# Patient Record
Sex: Female | Born: 1935
Health system: Southern US, Community
[De-identification: ages and names within clinical notes are randomized; demographics above are authoritative.]

## PROBLEM LIST (undated history)

## (undated) DIAGNOSIS — S62102A Fracture of unspecified carpal bone, left wrist, initial encounter for closed fracture: Secondary | ICD-10-CM

## (undated) DIAGNOSIS — H409 Unspecified glaucoma: Secondary | ICD-10-CM

## (undated) DIAGNOSIS — R7303 Prediabetes: Secondary | ICD-10-CM

## (undated) DIAGNOSIS — I4891 Unspecified atrial fibrillation: Secondary | ICD-10-CM

## (undated) DIAGNOSIS — I10 Essential (primary) hypertension: Secondary | ICD-10-CM

## (undated) DIAGNOSIS — E785 Hyperlipidemia, unspecified: Secondary | ICD-10-CM

## (undated) HISTORY — DX: Hyperlipidemia, unspecified: E78.5

## (undated) HISTORY — PX: CATARACT EXTRACTION: SUR2

## (undated) HISTORY — DX: Prediabetes: R73.03

## (undated) HISTORY — PX: OTHER SURGICAL HISTORY: SHX169

## (undated) HISTORY — DX: Unspecified glaucoma: H40.9

---
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
## 1999-07-15 ENCOUNTER — Other Ambulatory Visit: Admission: RE | Admit: 1999-07-15 | Discharge: 1999-07-15 | Payer: Self-pay | Admitting: Family Medicine

## 2000-12-16 ENCOUNTER — Other Ambulatory Visit: Admission: RE | Admit: 2000-12-16 | Discharge: 2000-12-16 | Payer: Self-pay | Admitting: Family Medicine

## 2003-03-06 ENCOUNTER — Other Ambulatory Visit: Admission: RE | Admit: 2003-03-06 | Discharge: 2003-03-06 | Payer: Self-pay | Admitting: Family Medicine

## 2014-11-30 DIAGNOSIS — H02839 Dermatochalasis of unspecified eye, unspecified eyelid: Secondary | ICD-10-CM | POA: Diagnosis not present

## 2014-11-30 DIAGNOSIS — H18411 Arcus senilis, right eye: Secondary | ICD-10-CM | POA: Diagnosis not present

## 2014-11-30 DIAGNOSIS — H2511 Age-related nuclear cataract, right eye: Secondary | ICD-10-CM | POA: Diagnosis not present

## 2014-11-30 DIAGNOSIS — H4010X1 Unspecified open-angle glaucoma, mild stage: Secondary | ICD-10-CM | POA: Diagnosis not present

## 2015-01-07 DIAGNOSIS — H2512 Age-related nuclear cataract, left eye: Secondary | ICD-10-CM | POA: Diagnosis not present

## 2015-01-07 DIAGNOSIS — H25812 Combined forms of age-related cataract, left eye: Secondary | ICD-10-CM | POA: Diagnosis not present

## 2015-01-08 DIAGNOSIS — H2511 Age-related nuclear cataract, right eye: Secondary | ICD-10-CM | POA: Diagnosis not present

## 2015-01-28 DIAGNOSIS — H25811 Combined forms of age-related cataract, right eye: Secondary | ICD-10-CM | POA: Diagnosis not present

## 2015-01-28 DIAGNOSIS — H2511 Age-related nuclear cataract, right eye: Secondary | ICD-10-CM | POA: Diagnosis not present

## 2015-02-04 DIAGNOSIS — H2512 Age-related nuclear cataract, left eye: Secondary | ICD-10-CM | POA: Diagnosis not present

## 2015-02-20 DIAGNOSIS — D229 Melanocytic nevi, unspecified: Secondary | ICD-10-CM | POA: Diagnosis not present

## 2015-02-20 DIAGNOSIS — F419 Anxiety disorder, unspecified: Secondary | ICD-10-CM | POA: Diagnosis not present

## 2015-02-20 DIAGNOSIS — Z139 Encounter for screening, unspecified: Secondary | ICD-10-CM | POA: Diagnosis not present

## 2015-02-20 DIAGNOSIS — I10 Essential (primary) hypertension: Secondary | ICD-10-CM | POA: Diagnosis not present

## 2015-02-20 DIAGNOSIS — D499 Neoplasm of unspecified behavior of unspecified site: Secondary | ICD-10-CM | POA: Diagnosis not present

## 2015-02-20 DIAGNOSIS — L570 Actinic keratosis: Secondary | ICD-10-CM | POA: Diagnosis not present

## 2015-02-26 DIAGNOSIS — H2511 Age-related nuclear cataract, right eye: Secondary | ICD-10-CM | POA: Diagnosis not present

## 2015-02-28 DIAGNOSIS — Z961 Presence of intraocular lens: Secondary | ICD-10-CM | POA: Diagnosis not present

## 2015-04-06 ENCOUNTER — Inpatient Hospital Stay: Payer: Medicare Other

## 2015-04-06 ENCOUNTER — Inpatient Hospital Stay: Payer: Medicare Other | Admitting: Anesthesiology

## 2015-04-06 ENCOUNTER — Inpatient Hospital Stay: Admit: 2015-04-06 | Payer: Self-pay | Admitting: Orthopedic Surgery

## 2015-04-06 ENCOUNTER — Emergency Department: Payer: Medicare Other

## 2015-04-06 ENCOUNTER — Encounter
Admission: EM | Disposition: A | Discharge status: Home / Self Care | Payer: Self-pay | Source: Home / Self Care | Attending: Orthopedic Surgery

## 2015-04-06 ENCOUNTER — Other Ambulatory Visit: Payer: Self-pay

## 2015-04-06 ENCOUNTER — Inpatient Hospital Stay
Admission: EM | Admit: 2015-04-06 | Discharge: 2015-04-08 | DRG: 511 | Disposition: A | Discharge status: Home / Self Care | Payer: Medicare Other | Attending: Orthopedic Surgery | Admitting: Orthopedic Surgery

## 2015-04-06 DIAGNOSIS — W010XXA Fall on same level from slipping, tripping and stumbling without subsequent striking against object, initial encounter: Secondary | ICD-10-CM | POA: Diagnosis present

## 2015-04-06 DIAGNOSIS — S52691B Other fracture of lower end of right ulna, initial encounter for open fracture type I or II: Secondary | ICD-10-CM | POA: Diagnosis not present

## 2015-04-06 DIAGNOSIS — Y92019 Unspecified place in single-family (private) house as the place of occurrence of the external cause: Secondary | ICD-10-CM

## 2015-04-06 DIAGNOSIS — Z9889 Other specified postprocedural states: Secondary | ICD-10-CM

## 2015-04-06 DIAGNOSIS — S6992XA Unspecified injury of left wrist, hand and finger(s), initial encounter: Secondary | ICD-10-CM | POA: Diagnosis not present

## 2015-04-06 DIAGNOSIS — W19XXXA Unspecified fall, initial encounter: Secondary | ICD-10-CM | POA: Diagnosis not present

## 2015-04-06 DIAGNOSIS — Z8781 Personal history of (healed) traumatic fracture: Secondary | ICD-10-CM

## 2015-04-06 DIAGNOSIS — I10 Essential (primary) hypertension: Secondary | ICD-10-CM

## 2015-04-06 DIAGNOSIS — S52502B Unspecified fracture of the lower end of left radius, initial encounter for open fracture type I or II: Secondary | ICD-10-CM | POA: Diagnosis not present

## 2015-04-06 DIAGNOSIS — Z471 Aftercare following joint replacement surgery: Secondary | ICD-10-CM | POA: Diagnosis not present

## 2015-04-06 DIAGNOSIS — Z9689 Presence of other specified functional implants: Secondary | ICD-10-CM | POA: Diagnosis not present

## 2015-04-06 DIAGNOSIS — S52602B Unspecified fracture of lower end of left ulna, initial encounter for open fracture type I or II: Secondary | ICD-10-CM | POA: Diagnosis not present

## 2015-04-06 DIAGNOSIS — M79602 Pain in left arm: Secondary | ICD-10-CM | POA: Diagnosis not present

## 2015-04-06 DIAGNOSIS — N390 Urinary tract infection, site not specified: Secondary | ICD-10-CM | POA: Diagnosis not present

## 2015-04-06 DIAGNOSIS — S299XXA Unspecified injury of thorax, initial encounter: Secondary | ICD-10-CM | POA: Diagnosis not present

## 2015-04-06 DIAGNOSIS — Z01818 Encounter for other preprocedural examination: Secondary | ICD-10-CM | POA: Diagnosis not present

## 2015-04-06 DIAGNOSIS — S5290XB Unspecified fracture of unspecified forearm, initial encounter for open fracture type I or II: Secondary | ICD-10-CM | POA: Diagnosis present

## 2015-04-06 DIAGNOSIS — S52332A Displaced oblique fracture of shaft of left radius, initial encounter for closed fracture: Secondary | ICD-10-CM | POA: Diagnosis not present

## 2015-04-06 DIAGNOSIS — S5292XB Unspecified fracture of left forearm, initial encounter for open fracture type I or II: Secondary | ICD-10-CM | POA: Diagnosis not present

## 2015-04-06 DIAGNOSIS — S52591B Other fractures of lower end of right radius, initial encounter for open fracture type I or II: Secondary | ICD-10-CM | POA: Diagnosis not present

## 2015-04-06 DIAGNOSIS — S52202A Unspecified fracture of shaft of left ulna, initial encounter for closed fracture: Secondary | ICD-10-CM | POA: Diagnosis not present

## 2015-04-06 DIAGNOSIS — S52232A Displaced oblique fracture of shaft of left ulna, initial encounter for closed fracture: Secondary | ICD-10-CM | POA: Diagnosis not present

## 2015-04-06 DIAGNOSIS — S52302A Unspecified fracture of shaft of left radius, initial encounter for closed fracture: Secondary | ICD-10-CM | POA: Diagnosis not present

## 2015-04-06 HISTORY — PX: I&D EXTREMITY: SHX5045

## 2015-04-06 HISTORY — DX: Essential (primary) hypertension: I10

## 2015-04-06 HISTORY — DX: Fracture of unspecified carpal bone, left wrist, initial encounter for closed fracture: S62.102A

## 2015-04-06 HISTORY — PX: ORIF RADIAL FRACTURE: SHX5113

## 2015-04-06 LAB — COMPREHENSIVE METABOLIC PANEL
ALBUMIN: 3.7 g/dL (ref 3.5–5.0)
ALT: 17 U/L (ref 14–54)
ANION GAP: 7 (ref 5–15)
AST: 26 U/L (ref 15–41)
Alkaline Phosphatase: 44 U/L (ref 38–126)
BUN: 26 mg/dL — ABNORMAL HIGH (ref 6–20)
CHLORIDE: 101 mmol/L (ref 101–111)
CO2: 27 mmol/L (ref 22–32)
Calcium: 8.9 mg/dL (ref 8.9–10.3)
Creatinine, Ser: 1.08 mg/dL — ABNORMAL HIGH (ref 0.44–1.00)
GFR calc Af Amer: 53 mL/min — ABNORMAL LOW (ref >60)
GFR, EST NON AFRICAN AMERICAN: 46 mL/min — AB (ref >60)
GLUCOSE: 97 mg/dL (ref 65–99)
Potassium: 3.6 mmol/L (ref 3.5–5.1)
Sodium: 135 mmol/L (ref 135–145)
Total Bilirubin: 0.7 mg/dL (ref 0.3–1.2)
Total Protein: 7 g/dL (ref 6.5–8.1)

## 2015-04-06 LAB — URINALYSIS COMPLETE WITH MICROSCOPIC (ARMC ONLY)
Bilirubin Urine: NEGATIVE
Glucose, UA: NEGATIVE mg/dL
HGB URINE DIPSTICK: NEGATIVE
Ketones, ur: NEGATIVE mg/dL
Nitrite: POSITIVE — AB
PROTEIN: NEGATIVE mg/dL
Specific Gravity, Urine: 1.012 (ref 1.005–1.030)
pH: 7 (ref 5.0–8.0)

## 2015-04-06 LAB — CBC
HEMATOCRIT: 39.7 % (ref 35.0–47.0)
Hemoglobin: 13 g/dL (ref 12.0–16.0)
MCH: 30.4 pg (ref 26.0–34.0)
MCHC: 32.8 g/dL (ref 32.0–36.0)
MCV: 92.7 fL (ref 80.0–100.0)
Platelets: 169 10*3/uL (ref 150–440)
RBC: 4.28 MIL/uL (ref 3.80–5.20)
RDW: 13.7 % (ref 11.5–14.5)
WBC: 8.6 10*3/uL (ref 3.6–11.0)

## 2015-04-06 SURGERY — OPEN REDUCTION INTERNAL FIXATION (ORIF) RADIAL FRACTURE
Anesthesia: General | Site: Arm Lower | Laterality: Left

## 2015-04-06 MED ORDER — MAGNESIUM HYDROXIDE 400 MG/5ML PO SUSP: 30.0000 mL | Freq: Every day | ORAL | Status: DC | PRN

## 2015-04-06 MED ORDER — SODIUM CHLORIDE 0.9 % IV SOLN: INTRAVENOUS | Status: DC

## 2015-04-06 MED ORDER — MIDAZOLAM HCL 2 MG/2ML IJ SOLN
INTRAMUSCULAR | Status: DC | PRN
  Administered 2015-04-06: 2 mg via INTRAVENOUS

## 2015-04-06 MED ORDER — LATANOPROST 0.005 % OP SOLN
1.0000 [drp] | Freq: Every day | OPHTHALMIC | Status: DC
  Administered 2015-04-06 – 2015-04-07 (×2): 1 [drp] via OPHTHALMIC
  Filled 2015-04-06: qty 2.5

## 2015-04-06 MED ORDER — KETOROLAC TROMETHAMINE 30 MG/ML IJ SOLN
INTRAMUSCULAR | Status: DC | PRN
  Administered 2015-04-06: 30 mg via INTRAVENOUS

## 2015-04-06 MED ORDER — CEFAZOLIN SODIUM 1-5 GM-% IV SOLN
1.0000 g | Freq: Three times a day (TID) | INTRAVENOUS | Status: DC
  Administered 2015-04-07 – 2015-04-08 (×5): 1 g via INTRAVENOUS
  Filled 2015-04-06 (×7): qty 50

## 2015-04-06 MED ORDER — METOCLOPRAMIDE HCL 5 MG/ML IJ SOLN
10.0000 mg | Freq: Once | INTRAMUSCULAR | Status: AC
  Administered 2015-04-06: 10 mg via INTRAVENOUS

## 2015-04-06 MED ORDER — FENTANYL CITRATE (PF) 100 MCG/2ML IJ SOLN: 25.0000 ug | INTRAMUSCULAR | Status: DC | PRN

## 2015-04-06 MED ORDER — ALPRAZOLAM 0.5 MG PO TABS
0.5000 mg | ORAL_TABLET | Freq: Two times a day (BID) | ORAL | Status: DC
  Administered 2015-04-06 – 2015-04-08 (×4): 0.5 mg via ORAL
  Filled 2015-04-06 (×4): qty 1

## 2015-04-06 MED ORDER — HYDROCODONE-ACETAMINOPHEN 5-325 MG PO TABS
1.0000 | ORAL_TABLET | ORAL | Status: DC | PRN
  Administered 2015-04-06: 1 via ORAL
  Filled 2015-04-06: qty 1

## 2015-04-06 MED ORDER — NEOMYCIN-POLYMYXIN B GU 40-200000 IR SOLN
Status: AC
  Filled 2015-04-06: qty 20

## 2015-04-06 MED ORDER — PROPOFOL 10 MG/ML IV BOLUS
INTRAVENOUS | Status: DC | PRN
  Administered 2015-04-06: 150 mg via INTRAVENOUS

## 2015-04-06 MED ORDER — FENTANYL CITRATE (PF) 100 MCG/2ML IJ SOLN
INTRAMUSCULAR | Status: DC | PRN
  Administered 2015-04-06: 50 ug via INTRAVENOUS
  Administered 2015-04-06 (×2): 25 ug via INTRAVENOUS
  Administered 2015-04-06: 100 ug via INTRAVENOUS

## 2015-04-06 MED ORDER — CEFAZOLIN SODIUM 1-5 GM-% IV SOLN
INTRAVENOUS | Status: AC
  Administered 2015-04-06: 1 g via INTRAVENOUS
  Filled 2015-04-06: qty 50

## 2015-04-06 MED ORDER — BISACODYL 10 MG RE SUPP: 10.0000 mg | Freq: Every day | RECTAL | Status: DC | PRN

## 2015-04-06 MED ORDER — DEXAMETHASONE SODIUM PHOSPHATE 4 MG/ML IJ SOLN
INTRAMUSCULAR | Status: DC | PRN
  Administered 2015-04-06: 4 mg via INTRAVENOUS

## 2015-04-06 MED ORDER — PHENYLEPHRINE HCL 10 MG/ML IJ SOLN
INTRAMUSCULAR | Status: DC | PRN
  Administered 2015-04-06: 100 ug via INTRAVENOUS

## 2015-04-06 MED ORDER — SODIUM CHLORIDE 0.9 % IV BOLUS (SEPSIS)
1000.0000 mL | Freq: Once | INTRAVENOUS | Status: AC
  Administered 2015-04-06: 1000 mL via INTRAVENOUS

## 2015-04-06 MED ORDER — ONDANSETRON HCL 4 MG PO TABS: 4.0000 mg | ORAL_TABLET | Freq: Four times a day (QID) | ORAL | Status: DC | PRN

## 2015-04-06 MED ORDER — LIDOCAINE HCL (CARDIAC) 20 MG/ML IV SOLN
INTRAVENOUS | Status: DC | PRN
  Administered 2015-04-06: 60 mg via INTRAVENOUS

## 2015-04-06 MED ORDER — ONDANSETRON HCL 4 MG/2ML IJ SOLN: 4.0000 mg | Freq: Four times a day (QID) | INTRAMUSCULAR | Status: DC | PRN

## 2015-04-06 MED ORDER — METOCLOPRAMIDE HCL 5 MG/ML IJ SOLN
INTRAMUSCULAR | Status: AC
  Administered 2015-04-06: 10 mg via INTRAVENOUS
  Filled 2015-04-06: qty 2

## 2015-04-06 MED ORDER — TRIAMTERENE-HCTZ 37.5-25 MG PO TABS
1.5000 | ORAL_TABLET | Freq: Every day | ORAL | Status: DC
  Filled 2015-04-06: qty 2
  Filled 2015-04-06: qty 1

## 2015-04-06 MED ORDER — LACTATED RINGERS IV SOLN
INTRAVENOUS | Status: DC | PRN
  Administered 2015-04-06 (×2): via INTRAVENOUS

## 2015-04-06 MED ORDER — ONDANSETRON HCL 4 MG/2ML IJ SOLN: 4.0000 mg | Freq: Once | INTRAMUSCULAR | Status: DC | PRN

## 2015-04-06 MED ORDER — ONDANSETRON HCL 4 MG/2ML IJ SOLN
INTRAMUSCULAR | Status: DC | PRN
  Administered 2015-04-06: 4 mg via INTRAVENOUS

## 2015-04-06 MED ORDER — OXYCODONE HCL 5 MG PO TABS: 5.0000 mg | ORAL_TABLET | ORAL | Status: DC | PRN

## 2015-04-06 MED ORDER — NEOMYCIN-POLYMYXIN B GU 40-200000 IR SOLN
Status: DC | PRN
  Administered 2015-04-06: 4 mL

## 2015-04-06 MED ORDER — CEFAZOLIN SODIUM 1-5 GM-% IV SOLN
1.0000 g | Freq: Once | INTRAVENOUS | Status: AC
  Administered 2015-04-06: 1 g via INTRAVENOUS

## 2015-04-06 MED ORDER — DOCUSATE SODIUM 100 MG PO CAPS
100.0000 mg | ORAL_CAPSULE | Freq: Two times a day (BID) | ORAL | Status: DC
  Administered 2015-04-06 – 2015-04-08 (×4): 100 mg via ORAL
  Filled 2015-04-06 (×4): qty 1

## 2015-04-06 MED ORDER — SODIUM CHLORIDE 0.9 % IV SOLN
INTRAVENOUS | Status: DC
  Administered 2015-04-06: 20:00:00 via INTRAVENOUS

## 2015-04-06 SURGICAL SUPPLY — 53 items
BANDAGE ELASTIC 3 CLIP ST LF (GAUZE/BANDAGES/DRESSINGS) ×4 IMPLANT
BANDAGE ELASTIC 4 CLIP ST LF (GAUZE/BANDAGES/DRESSINGS) ×4 IMPLANT
BIT DRILL 2.5X110 QC LCP DISP (BIT) ×4 IMPLANT
BIT DRILL 2.8 (BIT) ×2
BIT DRILL CANN QC 2.8X165 (BIT) ×2 IMPLANT
BLADE SURG 15 STRL LF DISP TIS (BLADE) ×2 IMPLANT
BLADE SURG 15 STRL SS (BLADE) ×2
CANISTER SUCT 1200ML W/VALVE (MISCELLANEOUS) IMPLANT
CANISTER SUCT 3000ML PPV (MISCELLANEOUS) ×4 IMPLANT
CAST PADDING 3X4FT ST 30246 (SOFTGOODS) ×2
CHLORAPREP W/TINT 26ML (MISCELLANEOUS) IMPLANT
DRAPE FLUOR MINI C-ARM 54X84 (DRAPES) ×4 IMPLANT
DRILL BIT 2.8MM (BIT) ×2
GAUZE PETRO XEROFOAM 1X8 (MISCELLANEOUS) ×8 IMPLANT
GAUZE SPONGE 4X4 12PLY STRL (GAUZE/BANDAGES/DRESSINGS) ×4 IMPLANT
GAUZE XEROFORM 4X4 STRL (GAUZE/BANDAGES/DRESSINGS) ×4 IMPLANT
GLOVE BIOGEL M 6.5 STRL (GLOVE) ×4 IMPLANT
GLOVE BIOGEL PI IND STRL 9 (GLOVE) ×2 IMPLANT
GLOVE BIOGEL PI INDICATOR 9 (GLOVE) ×2
GLOVE INDICATOR 7.0 STRL GRN (GLOVE) ×4 IMPLANT
GLOVE SURG ORTHO 9.0 STRL STRW (GLOVE) ×4 IMPLANT
GOWN SPECIALTY ULTRA XL (MISCELLANEOUS) ×4 IMPLANT
GOWN STRL REUS W/ TWL LRG LVL3 (GOWN DISPOSABLE) ×2 IMPLANT
GOWN STRL REUS W/TWL 2XL LVL3 (GOWN DISPOSABLE) ×4 IMPLANT
GOWN STRL REUS W/TWL LRG LVL3 (GOWN DISPOSABLE) ×2
KIT RM TURNOVER STRD PROC AR (KITS) ×4 IMPLANT
NEEDLE FILTER BLUNT 18X 1/2SAF (NEEDLE) ×2
NEEDLE FILTER BLUNT 18X1 1/2 (NEEDLE) ×2 IMPLANT
NS IRRIG 1000ML POUR BTL (IV SOLUTION) ×4 IMPLANT
NS IRRIG 500ML POUR BTL (IV SOLUTION) IMPLANT
PACK EXTREMITY ARMC (MISCELLANEOUS) ×4 IMPLANT
PAD CAST CTTN 3X4 STRL (SOFTGOODS) ×2 IMPLANT
PAD CAST CTTN 4X4 STRL (SOFTGOODS) ×4 IMPLANT
PAD GROUND ADULT SPLIT (MISCELLANEOUS) ×4 IMPLANT
PADDING CAST COTTON 4X4 STRL (SOFTGOODS) ×4
PROS LCP PLATE 5H 72MM (Plate) ×4 IMPLANT
PROS LCP PLATE 6H 85MM (Plate) ×4 IMPLANT
PROSTHESIS LCP PLATE 5H 72MM (Plate) ×2 IMPLANT
PROSTHESIS LCP PLATE 6H 85MM (Plate) ×2 IMPLANT
SCREW CORTEX 3.5 14MM (Screw) ×10 IMPLANT
SCREW CORTEX 3.5 16MM (Screw) ×8 IMPLANT
SCREW LOCK CORT ST 3.5X14 (Screw) ×10 IMPLANT
SCREW LOCK CORT ST 3.5X16 (Screw) ×8 IMPLANT
SCREW LOCK T15 FT 16X3.5X2.9X (Screw) ×2 IMPLANT
SCREW LOCKING 3.5X16 (Screw) ×2 IMPLANT
SLING ARM S TX990203 (SOFTGOODS) ×4 IMPLANT
SPLINT CAST 1 STEP 3X12 (MISCELLANEOUS) ×4 IMPLANT
STOCKINETTE STRL 4IN 9604848 (GAUZE/BANDAGES/DRESSINGS) IMPLANT
SUT ETHILON 4-0 (SUTURE) ×4
SUT ETHILON 4-0 FS2 18XMFL BLK (SUTURE) ×4
SUT VICRYL 3-0 27IN (SUTURE) ×4 IMPLANT
SUTURE ETHLN 4-0 FS2 18XMF BLK (SUTURE) ×4 IMPLANT
SYR 3ML LL SCALE MARK (SYRINGE) ×4 IMPLANT

## 2015-04-06 NOTE — ED Provider Notes (Signed)
ED ECG REPORT I, Lavonia Drafts, the attending physician, personally viewed and interpreted this ECG.   Date: 04/06/2015  EKG Time: 2:42 PM  Rate: 71  Rhythm: normal sinus rhythm, 1st degree AV block  Axis: Alexis  Intervals:first-degree A-V block   ST&T Change: None   Lavonia Drafts, MD 04/06/15 1457

## 2015-04-06 NOTE — Anesthesia Postprocedure Evaluation (Signed)
  Anesthesia Post-op Note  Patient: Kristen Graham  Procedure(s) Performed: Procedure(s): OPEN REDUCTION INTERNAL FIXATION (ORIF) RADIAL FRACTURE (Left) IRRIGATION AND DEBRIDEMENT left forearm (Left)  Anesthesia type:General  Patient location: PACU  Post pain: Pain level controlled  Post assessment: Post-op Vital signs reviewed, Patient's Cardiovascular Status Stable, Respiratory Function Stable, Patent Airway and No signs of Nausea or vomiting  Post vital signs: Reviewed and stable  Last Vitals:  Filed Vitals:   04/06/15 1530  BP: 156/68  Pulse: 72  Temp:   Resp: 18    Level of consciousness: awake, alert  and patient cooperative  Complications: No apparent anesthesia complications

## 2015-04-06 NOTE — ED Provider Notes (Signed)
[facility] Emergency Department Provider Note  ____________________________________________  Time seen: Approximately 1:52 PM  I have reviewed the triage vital signs and the nursing notes.   HISTORY  Chief Complaint Fall and Arm Injury    HPI Kristen Graham is a 81 y.o. female presents to the emergency department after a mechanical fall in her home just before arrival. She reports that she tripped over a space heater and landed on her outstretched hand. She has an obvious deformity of the left wrist/forearm with laceration to the lateral side. She denies other injury, specifically striking her head or losing consciousness.   Past Medical History  Diagnosis Date  . Hypertension   . Left wrist fracture     Patient Active Problem List   Diagnosis Date Noted  . Open forearm fracture 04/06/2015    Past Surgical History  Procedure Laterality Date  . I&d leg    . Cataract extraction      No current outpatient prescriptions on file.  Allergies Sulfa antibiotics  History reviewed. No pertinent family history.  Social History History  Substance Use Topics  . Smoking status: Never Smoker   . Smokeless tobacco: Not on file  . Alcohol Use: No    Review of Systems Constitutional: No recent illness. Eyes: No visual changes. ENT: No sore throat. Cardiovascular: Denies chest pain or palpitations. Respiratory: Denies shortness of breath. Gastrointestinal: No abdominal pain.  Genitourinary: Negative for dysuria. Musculoskeletal: Pain in left wrist. Skin: Negative for rash. Neurological: Negative for headaches, focal weakness or numbness. 10-point ROS otherwise negative.  ____________________________________________   PHYSICAL EXAM:  VITAL SIGNS: ED Triage Vitals  Enc Vitals Group     BP 04/06/15 1353 144/71 mmHg     Pulse Rate 04/06/15 1353 75     Resp 04/06/15 1353 18     Temp 04/06/15 1353 97.7 F (36.5 C)     Temp Source 04/06/15  1353 Oral     SpO2 04/06/15 1353 99 %     Weight 04/06/15 1353 139 lb (63.05 kg)     Height 04/06/15 1353 5\' 3"  (1.6 m)     Head Cir --      Peak Flow --      Pain Score 04/06/15 1353 3     Pain Loc --      Pain Edu? --      Excl. in [place]? --     Constitutional: Alert and oriented. Well appearing and in no acute distress. Eyes: Conjunctivae are normal. EOMI. Head: Atraumatic. Nose: No congestion/rhinnorhea. Neck: No stridor.  Respiratory: Normal respiratory effort.   Musculoskeletal: Obvious deformity of the left wrist and forearm with an approximately 1 cm laceration to the lateral side. Neurologic:  Normal speech and language. No gross focal neurologic deficits are appreciated. Speech is normal. No gait instability. Circulation: 2+ left radial pulse, bleeding well controlled. Skin:  Skin is warm and dry. 1 cm laceration. Psychiatric: Mood and affect are normal. Speech and behavior are normal.  ____________________________________________   LABS (all labs ordered are listed, but only abnormal results are displayed)  Labs Reviewed  COMPREHENSIVE METABOLIC PANEL - Abnormal; Notable for the following:    BUN 26 (*)    Creatinine, Ser 1.08 (*)    GFR calc non Af Amer 46 (*)    GFR calc Af Amer 53 (*)    All other components within normal limits  URINALYSIS COMPLETEWITH MICROSCOPIC ([facility])  - Abnormal; Notable for the following:  Color, Urine YELLOW (*)    APPearance CLEAR (*)    Nitrite POSITIVE (*)    Leukocytes, UA TRACE (*)    Bacteria, UA RARE (*)    Squamous Epithelial / LPF 0-5 (*)    All other components within normal limits  CBC   ____________________________________________  RADIOLOGY  Is placed oblique fracture of the distal radial and distal ulnar diaphysis. ____________________________________________   PROCEDURES  Procedure(s) performed: None   ____________________________________________   INITIAL IMPRESSION / ASSESSMENT AND PLAN / ED  COURSE  Pertinent labs & imaging results that were available during my care of the patient were reviewed by me and considered in my medical decision making (see chart for details).  Obvious open fracture of the left radius and ulna. She will receive Ancef now and wants x-rays are taken and reviewed orthopedics will be consulted. Patient states she last ate at 8 AM and has had a small amount of water with medication just prior to arrival.  ----------------------------------------- 3:12 PM on 04/06/2015 -----------------------------------------  Dr. Rudene Christians paged for consult.  ----------------------------------------- 3:19 PM on 04/06/2015 -----------------------------------------  Consulted with Dr. Rudene Christians who plans to come to the ER to see Kristen Graham and discuss surgical intervention. Patient and family made aware of diagnosis and plan. Reinforced NPO status. Patient declined pain medication.  3:40 PM  Dr. Rudene Christians in with patient and family. Will go to surgery this afternoon.  ____________________________________________   FINAL CLINICAL IMPRESSION(S) / ED DIAGNOSES  Final diagnoses:  Fall  Radius and ulna distal fracture, left, open type I or II, initial encounter      Victorino Dike, FNP 04/06/15 2148  Lavonia Drafts, MD 04/07/15 [phone]

## 2015-04-06 NOTE — ED Notes (Signed)
To ED via [facility] after mechanical fall. Pt arrives with left arm splinted and bandaged. Pt denies hitting head or LOC. Pt alert and oriented X4, active, cooperative, pt in NAD. RR even and unlabored, color WNL.

## 2015-04-06 NOTE — H&P (Signed)
Chief complaint: Left arm pain  History of present illness: Patient was walking through her house and stumbled over a space heater landing directly on her left arm. She reports a history of prior fractures to the left arm. She suffered immediate pain and deformity and open wound and was brought to the emergency room where she is found to have an open forearm fracture she's being admitted for treatment of this.  Past medical history: Hypertension Medications: Xanax when necessary, aspirin 81 mg daily, Maxide 25 daily Allergy sulfa causing nausea Past surgery and multiple plastic surgeries a year ago because of a fox bites to both lower legs  Social history she is nonsmoker nondrinker lives alone  Review of systems is negative except for severe left arm pain, she denies loss of consciousness dizziness.  Physical exam: HEENT normocephalic/atraumatic. Teeth in good condition. Lungs are clear to auscultation Heart regular rate and rhythm with no murmur noted Abdomen soft nontender Extremity exam unremarkable and the left upper extremity for approximately 1 cm stellate wound on the ulnar aspect of the forearm, gross deformity consistent with both bone forearm fracture, she has a palpable radial artery pulse and sensation is intact to the superficial radial median and ulnar nerve distributions to the hand.  X-rays: Both bone forearm fracture with displacement penetrating wound of ulna apparent on initial x-rays  Clinical impression grade 1 open forearm fracture  Plan: Urgent irrigation debridement of wound with ORIF, postop IV antibiotics. Risks benefits possible complications were discussed in particular neurovascular injury potential for cutaneous nerve injury, patient understands this. Additionally with the open wound potential for deep infection and need for IV antibiotics and hospital admission. Patient understands and agrees to this

## 2015-04-06 NOTE — ED Notes (Signed)
Orderly arrived to take patient to OR

## 2015-04-06 NOTE — ED Notes (Signed)
OR notified that patient is ready for transport.

## 2015-04-06 NOTE — Anesthesia Preprocedure Evaluation (Addendum)
Anesthesia Evaluation  Patient identified by MRN, date of birth, ID band Patient awake    Reviewed: Allergy & Precautions, NPO status , Patient's Chart, lab work & pertinent test results  Airway Mallampati: II  TM Distance: >3 FB Neck ROM: Full    Dental  (+) Chipped   Pulmonary          Cardiovascular hypertension, Pt. on medications     Neuro/Psych Anxiety    GI/Hepatic   Endo/Other    Renal/GU      Musculoskeletal   Abdominal   Peds  Hematology   Anesthesia Other Findings   Reproductive/Obstetrics                           Anesthesia Physical Anesthesia Plan  ASA: II  Anesthesia Plan: General   Post-op Pain Management:    Induction:   Airway Management Planned: LMA  Additional Equipment:   Intra-op Plan:   Post-operative Plan:   Informed Consent: I have reviewed the patients History and Physical, chart, labs and discussed the procedure including the risks, benefits and alternatives for the proposed anesthesia with the patient or authorized representative who has indicated his/her understanding and acceptance.     Plan Discussed with:   Anesthesia Plan Comments:         Anesthesia Quick Evaluation

## 2015-04-06 NOTE — Op Note (Signed)
04/06/2015  6:42 PM  PATIENT:  Kristen Graham  81 y.o. female  PRE-OPERATIVE DIAGNOSIS:  open left forearm fracture  POST-OPERATIVE DIAGNOSIS:  same  PROCEDURE:  Procedure(s): OPEN REDUCTION INTERNAL FIXATION (ORIF) RADIAL FRACTURE (Left) IRRIGATION AND DEBRIDEMENT left forearm (Left)  SURGEON: Laurene Footman, MD  ASSISTANTS: None  ANESTHESIA:   general  EBL:  Total I/O In: 1200 [I.V.:1200] Out: 400 [Urine:400]  BLOOD ADMINISTERED:none  DRAINS: none   LOCAL MEDICATIONS USED:  NONE  SPECIMEN:  No Specimen  DISPOSITION OF SPECIMEN:  N/A  COUNTS:  YES  TOURNIQUET:  * Missing tourniquet times found for documented tourniquets in log:  223113 * Total Tourniquet Time Documented: Upper Arm (Left) - 54 minutes Total: Upper Arm (Left) - 54 minutes   IMPLANTS:Synthes 6-hole and 5 hole LC-DCP plates with multiple screws   DICTATION: .Dragon Dictation patient was brought to the operating room and after general anesthesia was obtained the left arm was prepped and draped in sterile fashion. After patient identification and timeout procedure were completed tourniquet was raised to her 50 mmHg. A scalpel was used to excise the skin in the area of the open wound as well as use of a scalpel to debride subcutaneous fat. The open fracture site was then thoroughly irrigated with antibiotic solution. A incision was then made along the ulna incorporating the open wound site. And the fracture site exposed. The radius was then exposed through a volar approach between brachioradialis and the FCR tendon there the radial artery and veins were identified and protected. The radius fracture was exposed stripping muscle off the volar and radial surface with reduction clamps the radius was reduced anatomically and a 6 hole LCDCP plate was applied and clamped in place screw holes were filled with compression at the fracture site with anatomic alignment essentially. Going back to the ulna rotational alignment  was obtained and the fracture fixed with a 5 hole plate with 3 cortical screws proximally and a locking and cortical screw distally with again essentially anatomic alignment there is full pronation supination. 6 C-arm mini C-arm was used during the case and screw length and plate placement along with alignment appeared to be acceptable in all planes. The wounds were thoroughly irrigated and the tourniquet let down hemostasis checked electrocautery and there is no excessive bleeding and fat with good circulation to the fingers noted as well as palpable radial artery pulse. The wounds are then closed with 3-0 Vicryl subcutaneously and 4-0 nylon for the skin with the area of the open wound left open to allow for drainage. Dressings of Xeroform 4 x 4's web roll and a volar splint were applied the patient center comes stable condition   CARE: Patient is admitted for postop antibiotics  PATIENT DISPOSITION:  PACU - hemodynamically stable.

## 2015-04-06 NOTE — Transfer of Care (Signed)
Immediate Anesthesia Transfer of Care Note  Patient: Kristen Graham  Procedure(s) Performed: Procedure(s): OPEN REDUCTION INTERNAL FIXATION (ORIF) RADIAL FRACTURE (Left) IRRIGATION AND DEBRIDEMENT left forearm (Left)  Patient Location: PACU  Anesthesia Type:General  Level of Consciousness: awake  Airway & Oxygen Therapy: Patient Spontanous Breathing  Post-op Assessment: Report given to RN  Post vital signs: stable  Last Vitals:  Filed Vitals:   04/06/15 1530  BP: 156/68  Pulse: 72  Temp:   Resp:     Complications: No apparent anesthesia complications

## 2015-04-07 MED ORDER — ACETAMINOPHEN 325 MG PO TABS
650.0000 mg | ORAL_TABLET | ORAL | Status: DC | PRN
  Administered 2015-04-08: 650 mg via ORAL
  Filled 2015-04-07: qty 2

## 2015-04-07 NOTE — Progress Notes (Signed)
DR MENZ ON ROUNDS. PT WITH LOW BP. TAKING MAXZIDE . MD REPORTS HOLD MED UNTIL THIS AFTERNOON AND SEE IF MED CAN BE GIVEN IF BP IMPROVES

## 2015-04-07 NOTE — Progress Notes (Signed)
   Subjective: 1 Day Post-Op Procedure(s) (LRB): OPEN REDUCTION INTERNAL FIXATION (ORIF) RADIAL FRACTURE (Left) IRRIGATION AND DEBRIDEMENT left forearm (Left) Patient reports pain as 0 on 0-10 scale.   Patient is well, and has had no acute complaints or problems We will start therapy today.  Plan is to go Home after hospital stay.  Objective: Vital signs in last 24 hours: Temp:  [97.2 F (36.2 C)-97.8 F (36.6 C)] 97.3 F (36.3 C) (05/22 0359) Pulse Rate:  [49-83] 62 (05/22 0359) Resp:  [11-18] 18 (05/22 0359) BP: (96-156)/(53-89) 96/54 mmHg (05/22 0359) SpO2:  [96 %-100 %] 98 % (05/22 0359) FiO2 (%):  [21 %] 21 % (05/21 1953) Weight:  [63.05 kg (139 lb)] 63.05 kg (139 lb) (05/21 1353)  Intake/Output from previous day: 05/21 0701 - 05/22 0700 In: 1894 [P.O.:120; I.V.:1774] Out: 800 [Urine:800] Intake/Output this shift:     Recent Labs  04/06/15 1447  HGB 13.0    Recent Labs  04/06/15 1447  WBC 8.6  RBC 4.28  HCT 39.7  PLT 169    Recent Labs  04/06/15 1447  NA 135  K 3.6  CL 101  CO2 27  BUN 26*  CREATININE 1.08*  GLUCOSE 97  CALCIUM 8.9   No results for input(s): LABPT, INR in the last 72 hours.  EXAM General - Patient is Alert, Appropriate and Oriented Extremity - Neurologically intact Splint is intact Dressing - dressing C/D/I and no drainage Motor Function - intact, moving foot and toes well on exam.   Past Medical History  Diagnosis Date  . Hypertension   . Left wrist fracture     Assessment/Plan:   1 Day Post-Op Procedure(s) (LRB): OPEN REDUCTION INTERNAL FIXATION (ORIF) RADIAL FRACTURE (Left) IRRIGATION AND DEBRIDEMENT left forearm (Left) Active Problems:   Open forearm fracture  Estimated body mass index is 24.63 kg/(m^2) as calculated from the following:   Height as of this encounter: 5\' 3"  (1.6 m).   Weight as of this encounter: 63.05 kg (139 lb). Advance diet as tolerated Continuous IV antibiotics Physical therapy to begin  today Urinalysis showed UTI, continue with Ancef, this should cover Escherichia coli, Klebsiella.   Nonweightbearing to the left upper extremity D/C O2 and Pulse OX and try on Room Air  T. Rachelle Hora, PA-C [place] 04/07/2015, 7:46 AM

## 2015-04-07 NOTE — Evaluation (Signed)
Physical Therapy Evaluation Patient Details Name: Kristen Graham MRN: [id number] DOB: [birth year] Today's Date: 04/07/2015   History of Present Illness  fall with L forearm fx, ORIF  Clinical Impression  Pt shows good ability to be mobile and safe with ambulation using a cane.  Reinforced many ways to pt and family that she is NWBing on the L and will need to scale back her activity level for a while.  Pt's family is able to be involved and help her out.      Follow Up Recommendations  (Pt will likely need PT when cleared for WBing)    Equipment Recommendations       Recommendations for Other Services       Precautions / Restrictions Precautions Required Braces or Orthoses: Sling Restrictions Weight Bearing Restrictions: Yes LUE Weight Bearing: Non weight bearing      Mobility  Bed Mobility Overal bed mobility: Modified Independent                Transfers Overall transfer level: Independent Equipment used: Straight cane                Ambulation/Gait Ambulation/Gait assistance: Modified independent (Device/Increase time) Ambulation Distance (Feet): 200 Feet Assistive device: Straight cane       General Gait Details: Pt walks with good speed and confidence.  She has no LOBs and no real safety concerns  Science writer    Modified Rankin (Stroke Patients Only)       Balance                                             Pertinent Vitals/Pain Pain Assessment: No/denies pain    Home Living Family/patient expects to be discharged to:: Private residence Living Arrangements: Alone Available Help at Discharge: Family Type of Home: House Home Access: Stairs to enter   Technical brewer of Steps: 1          Prior Function Level of Independence: Independent         Comments: Pt is very active, still drives, etc     Hand Dominance        Extremity/Trunk Assessment   Upper Extremity  Assessment: LUE deficits/detail;Generalized weakness       LUE Deficits / Details: L UE in sling, NWBing   Lower Extremity Assessment: Overall WFL for tasks assessed         Communication   Communication: No difficulties  Cognition Arousal/Alertness: Awake/alert Behavior During Therapy: WFL for tasks assessed/performed Overall Cognitive Status: Within Functional Limits for tasks assessed                      General Comments      Exercises        Assessment/Plan    PT Assessment Patient needs continued PT services  PT Diagnosis Generalized weakness   PT Problem List Decreased strength;Decreased knowledge of precautions  PT Treatment Interventions Therapeutic activities;Therapeutic exercise;Functional mobility training   PT Goals (Current goals can be found in the Care Plan section) Acute Rehab PT Goals Patient Stated Goal: "Go home and get better" PT Goal Formulation: With patient/family Time For Goal Achievement: 04/21/15 Potential to Achieve Goals: Good    Frequency 7X/week   Barriers to discharge  Co-evaluation               End of Session Equipment Utilized During Treatment: Gait belt Activity Tolerance: Patient tolerated treatment well Patient left: in chair;with family/visitor present           Time: 1136-1201 PT Time Calculation (min) (ACUTE ONLY): 25 min   Charges:   PT Evaluation $Initial PT Evaluation Tier I: 1 Procedure     PT G Codes:       [name], PT, DPT [phone]  [name] 04/07/2015, 2:36 PM

## 2015-04-07 NOTE — Progress Notes (Signed)
PT OOB TO CHAIR MOST OF THE DAYS. DRESSING WITH ACE INTACT TO LEFT FOREARM.FINGERS SWOLLEN . MOVES ALL FINGERS WELL CONSTANTLY. DENIES ANY PAIN.BP REMAINS LOW. HAS NOT REQUIRED MAXZIDE THIS SHIFT . PT /FAMILY I/S TO HAVE PT F/U WITH INTERNIST. PT HAS LOW  BP MOST OF THE TIME AND FAMILY REPORTS MAY HAVE CONTRIBUTED TO FALL

## 2015-04-07 NOTE — Care Management Note (Signed)
Case Management Note  Patient Details  Name: Kristen Graham MRN: [id number] Date of Birth: [birth year]  Subjective/Objective:              Discharge planning     Discussed discharge planning with Ms Rager and daughter Abrina Petz. Per Vaughan Basta, three adult children will be checking in frequently with Mrs Urbanowicz who lives alone and wants to return to her home. Vaughan Basta and Mrs Charlie agreed upon [facility] to be Mrs [facility] home health provider. "Pernice" at [employer] confirmed via phone call that Mrs [place] address is within the catchment area for [facility] home health and DME. Mrs Steffenhagen and Vaughan Basta discussed home health DME needs and stated to this writer that Mrs Wilbourne does not want either a rolling walker or bedside commode. Mrs Ogando uses a cane at home. Pharmacy is [facility] in [place].       Expected Discharge Date:                  Expected Discharge Plan:  [place]  In-House Referral:     Discharge planning Services  CM Consult  Post Acute Care Choice:  Home Health Choice offered to:  Adult Children, Patient  DME Arranged:    DME Agency:  [place] Arranged:  PT [place] Agency:  [place]  Status of Service:  In process, will continue to follow  Medicare Important Message Given:    Date Medicare IM Given:    Medicare IM give by:    Date Additional Medicare IM Given:    Additional Medicare Important Message give by:     If discussed at [place] of Stay Meetings, dates discussed:    Additional Comments:  Josslin Sanjuan A, RN 04/07/2015, 1:34 PM

## 2015-04-08 ENCOUNTER — Encounter: Payer: Self-pay | Admitting: Orthopedic Surgery

## 2015-04-08 MED ORDER — ACETAMINOPHEN 325 MG PO TABS: 650.0000 mg | ORAL_TABLET | ORAL | Status: DC | PRN

## 2015-04-08 MED ORDER — CEPHALEXIN 500 MG PO CAPS: 500.0000 mg | ORAL_CAPSULE | Freq: Four times a day (QID) | ORAL | Status: AC

## 2015-04-08 MED ORDER — OXYCODONE HCL 5 MG PO TABS: 5.0000 mg | ORAL_TABLET | ORAL | Status: AC | PRN

## 2015-04-08 NOTE — Discharge Instructions (Signed)
Kristen Dixon, PA-C Physician Assistant Certified Signed Orthopedics Discharge Planning 04/08/2015 6:37 AM    Expand All Collapse All   INSTRUCTIONS AFTER Surgery   Remove items at home which could result in a fall. This includes throw rugs or furniture in walking pathways  ICE to the affected joint every three hours while awake for 30 minutes at a time, for at least the first 3-5 days, and then as needed for pain and swelling. Continue to use ice for pain and swelling. You may notice swelling that will progress down to the foot and ankle. This is normal after surgery. Elevate your leg when you are not up walking on it.   Continue to use the breathing machine you got in the hospital (incentive spirometer) which will help keep your temperature down. It is common for your temperature to cycle up and down following surgery, especially at night when you are not up moving around and exerting yourself. The breathing machine keeps your lungs expanded and your temperature down.   DIET: As you were doing prior to hospitalization, we recommend a well-balanced diet.  DRESSING / WOUND CARE / SHOWERING  Leave the dressing in place with the splint intact. No dressing change until seen in the clinic.  ACTIVITY   Increase activity slowly as tolerated, but follow the weight bearing instructions below.   No driving until further direction given by your physician. You cannot drive while taking narcotics.   No lifting or carrying greater than 10 lbs. until further directed by your surgeon.  Avoid periods of inactivity such as sitting longer than an hour when not asleep. This helps prevent blood clots.  You may return to work once you are authorized by your doctor.    WEIGHT BEARING   Other: Use caution with putting pressure on the left wrist with any walker or cane.    CONSTIPATION  Constipation is defined medically as fewer than three stools per week and severe constipation as  less than one stool per week. Even if you have a regular bowel pattern at home, your normal regimen is likely to be disrupted due to multiple reasons following surgery. Combination of anesthesia, postoperative narcotics, change in appetite and fluid intake all can affect your bowels.   YOU MUST use at least one of the following options; they are listed in order of increasing strength to get the job done. They are all available over the counter, and you may need to use some, POSSIBLY even all of these options:   Drink plenty of fluids (prune juice may be helpful) and high fiber foods Colace 100 mg by mouth twice a day  Senokot for constipation as directed and as needed Dulcolax (bisacodyl), take with full glass of water  Miralax (polyethylene glycol) once or twice a day as needed.  If you have tried all these things and are unable to have a bowel movement in the first 3-4 days after surgery call either your surgeon or your primary doctor.   If you experience loose stools or diarrhea, hold the medications until you stool forms back up. If your symptoms do not get better within 1 week or if they get worse, check with your doctor. If you experience "the worst abdominal pain ever" or develop nausea or vomiting, please contact the office immediately for further recommendations for treatment.   ITCHING: If you experience itching with your medications, try taking only a single pain pill, or even half a pain pill at a time. You can  also use Benadryl over the counter for itching or also to help with sleep.    MEDICATIONS: See your medication summary on the After Visit Summary that nursing will review with you. You may have some home medications which will be placed on hold until you complete the course of blood thinner medication. It is important for you to complete the blood thinner medication as prescribed.  PRECAUTIONS: If you experience chest pain or shortness of breath - call 911  immediately for transfer to the hospital emergency department.   If you develop a fever greater that 101 F, purulent drainage from wound, increased redness or drainage from wound, foul odor from the wound/dressing, or calf pain - CONTACT YOUR SURGEON.    FOLLOW-UP APPOINTMENTS: Follow-up with orthopedic group in 2 weeks.  OTHER INSTRUCTIONS:   Ice and elevate the arm as needed. Try not to get your dressing or splint wet.  MAKE SURE YOU:   Understand these instructions.   Get help right away if you are not doing well or get worse.   Thank you for letting us be a part of your medical care team. It is a privilege we respect greatly. We hope these instructions will help you stay on track for a fast and full recovery!

## 2015-04-08 NOTE — Progress Notes (Signed)
Pt's IV is no longer any good and pt needed to get one more dose of ancef.  Dr Rudene Christians said to go ahead and D/C the pt

## 2015-04-08 NOTE — Discharge Planning (Signed)
INSTRUCTIONS AFTER Surgery  o Remove items at home which could result in a fall. This includes throw rugs or furniture in walking pathways o ICE to the affected joint every three hours while awake for 30 minutes at a time, for at least the first 3-5 days, and then as needed for pain and swelling.  Continue to use ice for pain and swelling. You may notice swelling that will progress down to the foot and ankle.  This is normal after surgery.  Elevate your leg when you are not up walking on it.   o Continue to use the breathing machine you got in the hospital (incentive spirometer) which will help keep your temperature down.  It is common for your temperature to cycle up and down following surgery, especially at night when you are not up moving around and exerting yourself.  The breathing machine keeps your lungs expanded and your temperature down.   DIET:  As you were doing prior to hospitalization, we recommend a well-balanced diet.  DRESSING / WOUND CARE / SHOWERING  Leave the dressing in place with the splint intact. No dressing change until seen in the clinic.  ACTIVITY  o Increase activity slowly as tolerated, but follow the weight bearing instructions below.   o No driving until further direction given by your physician.  You cannot drive while taking narcotics.  o No lifting or carrying greater than 10 lbs. until further directed by your surgeon. o Avoid periods of inactivity such as sitting longer than an hour when not asleep. This helps prevent blood clots.  o You may return to work once you are authorized by your doctor.     WEIGHT BEARING   Other:  Use caution with putting pressure on the left wrist with any walker or cane.    CONSTIPATION  Constipation is defined medically as fewer than three stools per week and severe constipation as less than one stool per week.  Even if you have a regular bowel pattern at home, your normal regimen is likely to be disrupted due to multiple  reasons following surgery.  Combination of anesthesia, postoperative narcotics, change in appetite and fluid intake all can affect your bowels.   YOU MUST use at least one of the following options; they are listed in order of increasing strength to get the job done.  They are all available over the counter, and you may need to use some, POSSIBLY even all of these options:    Drink plenty of fluids (prune juice may be helpful) and high fiber foods Colace 100 mg by mouth twice a day  Senokot for constipation as directed and as needed Dulcolax (bisacodyl), take with full glass of water  Miralax (polyethylene glycol) once or twice a day as needed.  If you have tried all these things and are unable to have a bowel movement in the first 3-4 days after surgery call either your surgeon or your primary doctor.    If you experience loose stools or diarrhea, hold the medications until you stool forms back up.  If your symptoms do not get better within 1 week or if they get worse, check with your doctor.  If you experience "the worst abdominal pain ever" or develop nausea or vomiting, please contact the office immediately for further recommendations for treatment.   ITCHING:  If you experience itching with your medications, try taking only a single pain pill, or even half a pain pill at a time.  You can also  use Benadryl over the counter for itching or also to help with sleep.    MEDICATIONS:  See your medication summary on the "After Visit Summary" that nursing will review with you.  You may have some home medications which will be placed on hold until you complete the course of blood thinner medication.  It is important for you to complete the blood thinner medication as prescribed.  PRECAUTIONS:  If you experience chest pain or shortness of breath - call 911 immediately for transfer to the hospital emergency department.   If you develop a fever greater that 101 F, purulent drainage from wound, increased  redness or drainage from wound, foul odor from the wound/dressing, or calf pain - CONTACT YOUR SURGEON.                                                   FOLLOW-UP APPOINTMENTS:  Follow-up with orthopedic group in 2 weeks.  OTHER INSTRUCTIONS:   Ice and elevate the arm as needed. Try not to get your dressing or splint wet.  MAKE SURE YOU:  . Understand these instructions.  . Get help right away if you are not doing well or get worse.    Thank you for letting us be a part of your medical care team.  It is a privilege we respect greatly.  We hope these instructions will help you stay on track for a fast and full recovery!

## 2015-04-08 NOTE — Discharge Summary (Signed)
Physician Discharge Summary  Subjective: 2 Days Post-Op Procedure(s) (LRB): OPEN REDUCTION INTERNAL FIXATION (ORIF) RADIAL FRACTURE (Left) IRRIGATION AND DEBRIDEMENT left forearm (Left) Patient reports pain as mild.   Patient seen in rounds with Dr. Rudene Christians. Patient is well, and has had no acute complaints or problems Patient is ready to go home with family  The patient did well with physical therapy, ambulating 200 feet. Her pain was managed well with oral pain medications. She had no other complications, and was ready to go home today.  Objective: Vital signs in last 24 hours: Temp:  [97.8 F (36.6 C)-98.2 F (36.8 C)] 98.2 F (36.8 C) (05/23 0408) Pulse Rate:  [61-78] 61 (05/23 0408) Resp:  [18] 18 (05/23 0408) BP: (107-123)/(49-63) 110/63 mmHg (05/23 0408) SpO2:  [97 %-99 %] 97 % (05/23 0408)  Intake/Output from previous day:  Intake/Output Summary (Last 24 hours) at 04/08/15 0643 Last data filed at 04/07/15 1000  Gross per 24 hour  Intake    400 ml  Output      1 ml  Net    399 ml    Intake/Output this shift:    Labs:  Recent Labs  04/06/15 1447  HGB 13.0    Recent Labs  04/06/15 1447  WBC 8.6  RBC 4.28  HCT 39.7  PLT 169    Recent Labs  04/06/15 1447  NA 135  K 3.6  CL 101  CO2 27  BUN 26*  CREATININE 1.08*  GLUCOSE 97  CALCIUM 8.9   No results for input(s): LABPT, INR in the last 72 hours.  EXAM: General - Patient is Alert and Oriented Extremity - Sensation intact distally No cellulitis present Incision - clean, tender, healing. A new dressing was applied today before discharge. Motor Function - intact, moving fingers on exam. No motion was tested with her wrist.  Assessment/Plan: 2 Days Post-Op Procedure(s) (LRB): OPEN REDUCTION INTERNAL FIXATION (ORIF) RADIAL FRACTURE (Left) IRRIGATION AND DEBRIDEMENT left forearm (Left) Procedure(s) (LRB): OPEN REDUCTION INTERNAL FIXATION (ORIF) RADIAL FRACTURE (Left) IRRIGATION AND DEBRIDEMENT left  forearm (Left) Past Medical History  Diagnosis Date  . Hypertension   . Left wrist fracture    Active Problems:   Open forearm fracture  Estimated body mass index is 24.63 kg/(m^2) as calculated from the following:   Height as of this encounter: 5\' 3"  (1.6 m).   Weight as of this encounter: 63.05 kg (139 lb). Discharge home with home health Diet - Regular diet Follow up - in in 7-10 days. Activity - WBAT Disposition - Home Condition Upon Discharge - Stable D/C Meds - See DC Summary DVT Prophylaxis - None  Reche Dixon, PA-C Orthopaedic Surgery 04/08/2015, 6:43 AM

## 2015-04-08 NOTE — Progress Notes (Signed)
  Subjective: 2 Days Post-Op Procedure(s) (LRB): OPEN REDUCTION INTERNAL FIXATION (ORIF) RADIAL FRACTURE (Left) IRRIGATION AND DEBRIDEMENT left forearm (Left) Patient reports pain as mild.   Patient seen in rounds with Dr. Rudene Christians. Patient is well, and has had no acute complaints or problems Plan is to go Home after hospital stay. Negative for chest pain and shortness of breath Fever: no Gastrointestinal:negative for nausea and vomiting  Objective: Vital signs in last 24 hours: Temp:  [97.8 F (36.6 C)-98.2 F (36.8 C)] 98.2 F (36.8 C) (05/23 0408) Pulse Rate:  [61-78] 61 (05/23 0408) Resp:  [18] 18 (05/23 0408) BP: (107-123)/(49-63) 110/63 mmHg (05/23 0408) SpO2:  [97 %-99 %] 97 % (05/23 0408)  Intake/Output from previous day:  Intake/Output Summary (Last 24 hours) at 04/08/15 0634 Last data filed at 04/07/15 1000  Gross per 24 hour  Intake    400 ml  Output      1 ml  Net    399 ml    Intake/Output this shift:    Labs:  Recent Labs  04/06/15 1447  HGB 13.0    Recent Labs  04/06/15 1447  WBC 8.6  RBC 4.28  HCT 39.7  PLT 169    Recent Labs  04/06/15 1447  NA 135  K 3.6  CL 101  CO2 27  BUN 26*  CREATININE 1.08*  GLUCOSE 97  CALCIUM 8.9   No results for input(s): LABPT, INR in the last 72 hours.   EXAM General - Patient is Alert and Oriented Extremity - Sensation intact distally No cellulitis present  Dressing/Incision - clean, tender, a new dressing and wrapping were done today with application of the splint. Motor Function - intact, moving fingers and elbow well on exam. No motion was done with the wrist today.  Past Medical History  Diagnosis Date  . Hypertension   . Left wrist fracture     Assessment/Plan: 2 Days Post-Op Procedure(s) (LRB): OPEN REDUCTION INTERNAL FIXATION (ORIF) RADIAL FRACTURE (Left) IRRIGATION AND DEBRIDEMENT left forearm (Left) Active Problems:   Open forearm fracture  Estimated body mass index is 24.63  kg/(m^2) as calculated from the following:   Height as of this encounter: 5\' 3"  (1.6 m).   Weight as of this encounter: 63.05 kg (139 lb). Up with therapy Discharge home with home health  DVT Prophylaxis - None   Reche Dixon, PA-C Orthopaedic Surgery 04/08/2015, 6:34 AM

## 2015-04-08 NOTE — Evaluation (Signed)
Occupational Therapy Evaluation Patient Details Name: Kristen Graham MRN: [id number] DOB: [birth year] Today's Date: 04/08/2015    History of Present Illness Pt lives alone at home and fell in the living room when she bumped into portable heater and fell and sustained a L forearm fracture and underwent an ORIF.  She has several family members who can help with supervision and ADLs and IADLs but may be home alone for a few hours.   Clinical Impression   Pt has sling on with NWB of LUE.  She requires minimal assistance for ADLs due to this and has family to assist.  Rec use of a shower chair with back when cleared to shower and a BSC over toilet for safe transfers for toileting and hygiene but pt states she has the sink in front of her to pull up on instead.  Pt seen for OT eval only and reviewed several safety considerations to prevent further falls.    Follow Up Recommendations  No OT follow up (Rec follow up for LUE when NWB status changed from eitehr OT or PT HH)    Equipment Recommendations  Tub/shower seat    Recommendations for Other Services       Precautions / Restrictions Precautions Required Braces or Orthoses: Sling Restrictions Weight Bearing Restrictions: Yes LUE Weight Bearing: Non weight bearing      Mobility Bed Mobility                  Transfers                      Balance                                            ADL Overall ADL's : Needs assistance/impaired Eating/Feeding: Minimal assistance;Set up   Grooming: Wash/dry hands;Wash/dry face;Oral care;Applying deodorant;Brushing hair;Set up;Modified independent   Upper Body Bathing: Modified independent;Set up       Upper Body Dressing : Modified independent;Set up   Lower Body Dressing: Minimal assistance;Sit to/from stand   Toilet Transfer: Supervision/safety;Set up Toilet Transfer Details (indicate cue type and reason):  (discussed use of BSC over toilet but pt  refused and said she can use sink to hold onto thats in front of toilet)           General ADL Comments: Pt broke her arm in the past and learned one handed techniiques and has family to assist as needed.  Reviewed safety with having phone at all times, use a shower chair when cleared to shower and rec BSC over toilet but pt refused that     Vision     Perception     Praxis      Pertinent Vitals/Pain Pain Assessment: No/denies pain     Hand Dominance Right   Extremity/Trunk Assessment Upper Extremity Assessment Upper Extremity Assessment: Generalized weakness LUE Deficits / Details: L UE in sling, NWBing, adequate strength in RUE for one handed tasks   Lower Extremity Assessment Lower Extremity Assessment: Defer to PT evaluation       Communication Communication Communication: No difficulties   Cognition Arousal/Alertness: Awake/alert Behavior During Therapy: WFL for tasks assessed/performed Overall Cognitive Status: Within Functional Limits for tasks assessed                     General Comments  Exercises       Shoulder Instructions      Home Living Family/patient expects to be discharged to:: Private residence Living Arrangements: Alone Available Help at Discharge: Family Type of Home: House Home Access: Stairs to enter Technical brewer of Steps: 1             Bathroom Toilet: Standard Bathroom Accessibility: Yes How Accessible: Other (comment) (pt used a SPC for ambulation) Home Equipment: Cane - single point          Prior Functioning/Environment Level of Independence: Independent        Comments: Pt is very active, still drives, etc    OT Diagnosis:     OT Problem List:     OT Treatment/Interventions:      OT Goals(Current goals can be found in the care plan section) Acute Rehab OT Goals Patient Stated Goal: go home today  OT Frequency:     Barriers to D/C:            Co-evaluation               End of Session Nurse Communication:  (prior to seeing for clearance and needs)  Activity Tolerance: Patient tolerated treatment well Patient left: in chair;with call bell/phone within reach;with chair alarm set;with family/visitor present (son [name])   Time: 6812-7517 OT Time Calculation (min): 23 min Charges:  OT General Charges $OT Visit: 1 Procedure OT Evaluation $Initial OT Evaluation Tier I: 1 Procedure OT Treatments $Self Care/Home Management : 8-22 mins G-Codes:    Wofford,Susan Apr 13, 2015, 11:19 AM    Chrys Racer, OTR/L

## 2015-04-08 NOTE — Care Management Note (Signed)
Case Management Note  Patient Details  Name: Kristen Graham MRN: [id number] Date of Birth: 12-11-1928  Subjective/Objective:  PT reviewed and pt ambulated well. Surgeons did not order home health PT. Discussed with pt and son. Both are in agreement that pt have no PT at this.   Pt uses a cane and is ambulating well.                Action/Plan:   Home with care by family   Expected Discharge Date:      04/08/2015            Expected Discharge Plan:  Home/Self Care  In-House Referral:     Discharge planning Services  CM Consult  Post Acute Care Choice:    Choice offered to:     DME Arranged:    DME Agency:     HH Arranged:    [place]:     Status of Service:  Completed, signed off  Medicare Important Message Given:  Yes Date Medicare IM Given:  04/08/15 Medicare IM give by:  Orvan July Date Additional Medicare IM Given:    Additional Medicare Important Message give by:     If discussed at [place] of Stay Meetings, dates discussed:    Additional Comments:  Jolly Mango, RN 04/08/2015, 10:23 AM

## 2015-04-08 NOTE — Progress Notes (Signed)
Removed PIV.  Went over discharge paperwork including f/u appt and new Rx.  Patient able to repeat back information. Son at bedside.  No questions at this time.  Patient escorted out of hospital via wheelchair by volunteers.

## 2015-04-16 DIAGNOSIS — S42302S Unspecified fracture of shaft of humerus, left arm, sequela: Secondary | ICD-10-CM | POA: Diagnosis not present

## 2015-04-16 DIAGNOSIS — R52 Pain, unspecified: Secondary | ICD-10-CM | POA: Diagnosis not present

## 2015-05-15 DIAGNOSIS — M25532 Pain in left wrist: Secondary | ICD-10-CM | POA: Diagnosis not present

## 2015-05-27 DIAGNOSIS — I10 Essential (primary) hypertension: Secondary | ICD-10-CM | POA: Diagnosis not present

## 2015-05-27 DIAGNOSIS — Z139 Encounter for screening, unspecified: Secondary | ICD-10-CM | POA: Diagnosis not present

## 2015-05-27 DIAGNOSIS — L989 Disorder of the skin and subcutaneous tissue, unspecified: Secondary | ICD-10-CM | POA: Diagnosis not present

## 2015-05-27 DIAGNOSIS — G4700 Insomnia, unspecified: Secondary | ICD-10-CM | POA: Diagnosis not present

## 2015-05-27 DIAGNOSIS — F419 Anxiety disorder, unspecified: Secondary | ICD-10-CM | POA: Diagnosis not present

## 2015-06-24 DIAGNOSIS — C439 Malignant melanoma of skin, unspecified: Secondary | ICD-10-CM | POA: Diagnosis not present

## 2015-06-24 DIAGNOSIS — Z4802 Encounter for removal of sutures: Secondary | ICD-10-CM | POA: Diagnosis not present

## 2015-06-24 DIAGNOSIS — T8131XD Disruption of external operation (surgical) wound, not elsewhere classified, subsequent encounter: Secondary | ICD-10-CM | POA: Diagnosis not present

## 2015-07-01 DIAGNOSIS — C44729 Squamous cell carcinoma of skin of left lower limb, including hip: Secondary | ICD-10-CM | POA: Diagnosis not present

## 2015-08-15 DIAGNOSIS — S5292XE Unspecified fracture of left forearm, subsequent encounter for open fracture type I or II with routine healing: Secondary | ICD-10-CM | POA: Diagnosis not present

## 2015-08-15 DIAGNOSIS — M25532 Pain in left wrist: Secondary | ICD-10-CM | POA: Diagnosis not present

## 2015-08-19 DIAGNOSIS — Z23 Encounter for immunization: Secondary | ICD-10-CM | POA: Diagnosis not present

## 2015-09-03 DIAGNOSIS — H43313 Vitreous membranes and strands, bilateral: Secondary | ICD-10-CM | POA: Diagnosis not present

## 2015-09-03 DIAGNOSIS — H40009 Preglaucoma, unspecified, unspecified eye: Secondary | ICD-10-CM | POA: Diagnosis not present

## 2015-09-04 DIAGNOSIS — H43313 Vitreous membranes and strands, bilateral: Secondary | ICD-10-CM | POA: Diagnosis not present

## 2015-09-26 DIAGNOSIS — F419 Anxiety disorder, unspecified: Secondary | ICD-10-CM | POA: Diagnosis not present

## 2015-09-26 DIAGNOSIS — R5383 Other fatigue: Secondary | ICD-10-CM | POA: Diagnosis not present

## 2015-09-26 DIAGNOSIS — I10 Essential (primary) hypertension: Secondary | ICD-10-CM | POA: Diagnosis not present

## 2015-10-31 DIAGNOSIS — J029 Acute pharyngitis, unspecified: Secondary | ICD-10-CM | POA: Diagnosis not present

## 2015-10-31 DIAGNOSIS — J3489 Other specified disorders of nose and nasal sinuses: Secondary | ICD-10-CM | POA: Diagnosis not present

## 2015-10-31 DIAGNOSIS — R05 Cough: Secondary | ICD-10-CM | POA: Diagnosis not present

## 2015-12-26 DIAGNOSIS — I10 Essential (primary) hypertension: Secondary | ICD-10-CM | POA: Diagnosis not present

## 2015-12-26 DIAGNOSIS — Z139 Encounter for screening, unspecified: Secondary | ICD-10-CM | POA: Diagnosis not present

## 2015-12-27 DIAGNOSIS — G4700 Insomnia, unspecified: Secondary | ICD-10-CM | POA: Diagnosis not present

## 2015-12-27 DIAGNOSIS — I10 Essential (primary) hypertension: Secondary | ICD-10-CM | POA: Diagnosis not present

## 2016-01-23 DIAGNOSIS — I10 Essential (primary) hypertension: Secondary | ICD-10-CM | POA: Diagnosis not present

## 2016-01-23 DIAGNOSIS — S81802A Unspecified open wound, left lower leg, initial encounter: Secondary | ICD-10-CM | POA: Diagnosis not present

## 2016-01-31 DIAGNOSIS — S81802D Unspecified open wound, left lower leg, subsequent encounter: Secondary | ICD-10-CM | POA: Diagnosis not present

## 2016-01-31 DIAGNOSIS — R52 Pain, unspecified: Secondary | ICD-10-CM | POA: Diagnosis not present

## 2016-01-31 DIAGNOSIS — Z139 Encounter for screening, unspecified: Secondary | ICD-10-CM | POA: Diagnosis not present

## 2016-02-06 DIAGNOSIS — S81832D Puncture wound without foreign body, left lower leg, subsequent encounter: Secondary | ICD-10-CM | POA: Diagnosis not present

## 2016-02-06 DIAGNOSIS — R52 Pain, unspecified: Secondary | ICD-10-CM | POA: Diagnosis not present

## 2016-02-14 DIAGNOSIS — S81801D Unspecified open wound, right lower leg, subsequent encounter: Secondary | ICD-10-CM | POA: Diagnosis not present

## 2016-02-14 DIAGNOSIS — R52 Pain, unspecified: Secondary | ICD-10-CM | POA: Diagnosis not present

## 2016-03-03 DIAGNOSIS — H43313 Vitreous membranes and strands, bilateral: Secondary | ICD-10-CM | POA: Diagnosis not present

## 2016-03-03 DIAGNOSIS — Z961 Presence of intraocular lens: Secondary | ICD-10-CM | POA: Diagnosis not present

## 2016-03-17 DIAGNOSIS — S81812A Laceration without foreign body, left lower leg, initial encounter: Secondary | ICD-10-CM | POA: Diagnosis not present

## 2016-03-17 DIAGNOSIS — R52 Pain, unspecified: Secondary | ICD-10-CM | POA: Diagnosis not present

## 2016-05-01 DIAGNOSIS — E119 Type 2 diabetes mellitus without complications: Secondary | ICD-10-CM | POA: Diagnosis not present

## 2016-05-01 DIAGNOSIS — I10 Essential (primary) hypertension: Secondary | ICD-10-CM | POA: Diagnosis not present

## 2016-05-01 DIAGNOSIS — N390 Urinary tract infection, site not specified: Secondary | ICD-10-CM | POA: Diagnosis not present

## 2016-05-01 DIAGNOSIS — Z139 Encounter for screening, unspecified: Secondary | ICD-10-CM | POA: Diagnosis not present

## 2016-05-01 DIAGNOSIS — E039 Hypothyroidism, unspecified: Secondary | ICD-10-CM | POA: Diagnosis not present

## 2016-05-04 DIAGNOSIS — I10 Essential (primary) hypertension: Secondary | ICD-10-CM | POA: Diagnosis not present

## 2016-05-04 DIAGNOSIS — L259 Unspecified contact dermatitis, unspecified cause: Secondary | ICD-10-CM | POA: Diagnosis not present

## 2016-05-04 DIAGNOSIS — L299 Pruritus, unspecified: Secondary | ICD-10-CM | POA: Diagnosis not present

## 2016-06-05 DIAGNOSIS — N390 Urinary tract infection, site not specified: Secondary | ICD-10-CM | POA: Diagnosis not present

## 2016-06-05 DIAGNOSIS — R300 Dysuria: Secondary | ICD-10-CM | POA: Diagnosis not present

## 2016-08-06 DIAGNOSIS — E7800 Pure hypercholesterolemia, unspecified: Secondary | ICD-10-CM | POA: Diagnosis not present

## 2016-08-06 DIAGNOSIS — I10 Essential (primary) hypertension: Secondary | ICD-10-CM | POA: Diagnosis not present

## 2016-08-07 DIAGNOSIS — I10 Essential (primary) hypertension: Secondary | ICD-10-CM | POA: Diagnosis not present

## 2016-08-07 DIAGNOSIS — J3489 Other specified disorders of nose and nasal sinuses: Secondary | ICD-10-CM | POA: Diagnosis not present

## 2016-08-07 DIAGNOSIS — R799 Abnormal finding of blood chemistry, unspecified: Secondary | ICD-10-CM | POA: Diagnosis not present

## 2016-08-21 DIAGNOSIS — Z23 Encounter for immunization: Secondary | ICD-10-CM | POA: Diagnosis not present

## 2016-09-14 DIAGNOSIS — Z9181 History of falling: Secondary | ICD-10-CM | POA: Diagnosis not present

## 2016-09-14 DIAGNOSIS — Z1389 Encounter for screening for other disorder: Secondary | ICD-10-CM | POA: Diagnosis not present

## 2016-09-14 DIAGNOSIS — I10 Essential (primary) hypertension: Secondary | ICD-10-CM | POA: Diagnosis not present

## 2016-12-17 DIAGNOSIS — R7303 Prediabetes: Secondary | ICD-10-CM | POA: Diagnosis not present

## 2016-12-17 DIAGNOSIS — E781 Pure hyperglyceridemia: Secondary | ICD-10-CM | POA: Diagnosis not present

## 2016-12-17 DIAGNOSIS — I10 Essential (primary) hypertension: Secondary | ICD-10-CM | POA: Diagnosis not present

## 2017-03-12 DIAGNOSIS — E7800 Pure hypercholesterolemia, unspecified: Secondary | ICD-10-CM | POA: Diagnosis not present

## 2017-03-12 DIAGNOSIS — I10 Essential (primary) hypertension: Secondary | ICD-10-CM | POA: Diagnosis not present

## 2017-03-12 DIAGNOSIS — J3489 Other specified disorders of nose and nasal sinuses: Secondary | ICD-10-CM | POA: Diagnosis not present

## 2017-06-25 DIAGNOSIS — Z0000 Encounter for general adult medical examination without abnormal findings: Secondary | ICD-10-CM | POA: Diagnosis not present

## 2017-06-25 DIAGNOSIS — N390 Urinary tract infection, site not specified: Secondary | ICD-10-CM | POA: Diagnosis not present

## 2017-06-25 DIAGNOSIS — R300 Dysuria: Secondary | ICD-10-CM | POA: Diagnosis not present

## 2017-07-23 DIAGNOSIS — E781 Pure hyperglyceridemia: Secondary | ICD-10-CM | POA: Diagnosis not present

## 2017-07-23 DIAGNOSIS — Z6823 Body mass index (BMI) 23.0-23.9, adult: Secondary | ICD-10-CM | POA: Diagnosis not present

## 2017-07-23 DIAGNOSIS — I10 Essential (primary) hypertension: Secondary | ICD-10-CM | POA: Diagnosis not present

## 2017-07-23 DIAGNOSIS — Z139 Encounter for screening, unspecified: Secondary | ICD-10-CM | POA: Diagnosis not present

## 2017-07-23 DIAGNOSIS — Z79899 Other long term (current) drug therapy: Secondary | ICD-10-CM | POA: Diagnosis not present

## 2017-08-27 DIAGNOSIS — Z23 Encounter for immunization: Secondary | ICD-10-CM | POA: Diagnosis not present

## 2017-08-27 DIAGNOSIS — R52 Pain, unspecified: Secondary | ICD-10-CM | POA: Diagnosis not present

## 2017-08-27 DIAGNOSIS — S81812A Laceration without foreign body, left lower leg, initial encounter: Secondary | ICD-10-CM | POA: Diagnosis not present

## 2017-09-03 DIAGNOSIS — Z23 Encounter for immunization: Secondary | ICD-10-CM | POA: Diagnosis not present

## 2017-09-03 DIAGNOSIS — Z139 Encounter for screening, unspecified: Secondary | ICD-10-CM | POA: Diagnosis not present

## 2017-09-03 DIAGNOSIS — B9561 Methicillin susceptible Staphylococcus aureus infection as the cause of diseases classified elsewhere: Secondary | ICD-10-CM | POA: Diagnosis not present

## 2017-09-03 DIAGNOSIS — S8012XA Contusion of left lower leg, initial encounter: Secondary | ICD-10-CM | POA: Diagnosis not present

## 2017-09-06 DIAGNOSIS — B9561 Methicillin susceptible Staphylococcus aureus infection as the cause of diseases classified elsewhere: Secondary | ICD-10-CM | POA: Diagnosis not present

## 2017-09-06 DIAGNOSIS — S8012XD Contusion of left lower leg, subsequent encounter: Secondary | ICD-10-CM | POA: Diagnosis not present

## 2017-09-09 DIAGNOSIS — S8012XS Contusion of left lower leg, sequela: Secondary | ICD-10-CM | POA: Diagnosis not present

## 2017-09-09 DIAGNOSIS — B9561 Methicillin susceptible Staphylococcus aureus infection as the cause of diseases classified elsewhere: Secondary | ICD-10-CM | POA: Diagnosis not present

## 2017-09-09 DIAGNOSIS — J3489 Other specified disorders of nose and nasal sinuses: Secondary | ICD-10-CM | POA: Diagnosis not present

## 2017-09-16 DIAGNOSIS — S8012XS Contusion of left lower leg, sequela: Secondary | ICD-10-CM | POA: Diagnosis not present

## 2017-09-16 DIAGNOSIS — B9561 Methicillin susceptible Staphylococcus aureus infection as the cause of diseases classified elsewhere: Secondary | ICD-10-CM | POA: Diagnosis not present

## 2017-10-27 DIAGNOSIS — H40023 Open angle with borderline findings, high risk, bilateral: Secondary | ICD-10-CM | POA: Diagnosis not present

## 2017-10-27 DIAGNOSIS — H4020X0 Unspecified primary angle-closure glaucoma, stage unspecified: Secondary | ICD-10-CM | POA: Diagnosis not present

## 2017-10-27 DIAGNOSIS — H40013 Open angle with borderline findings, low risk, bilateral: Secondary | ICD-10-CM | POA: Diagnosis not present

## 2017-12-07 DIAGNOSIS — S81802A Unspecified open wound, left lower leg, initial encounter: Secondary | ICD-10-CM | POA: Diagnosis not present

## 2017-12-07 DIAGNOSIS — Z139 Encounter for screening, unspecified: Secondary | ICD-10-CM | POA: Diagnosis not present

## 2017-12-07 DIAGNOSIS — M79662 Pain in left lower leg: Secondary | ICD-10-CM | POA: Diagnosis not present

## 2017-12-07 DIAGNOSIS — B958 Unspecified staphylococcus as the cause of diseases classified elsewhere: Secondary | ICD-10-CM | POA: Diagnosis not present

## 2017-12-10 DIAGNOSIS — S81802D Unspecified open wound, left lower leg, subsequent encounter: Secondary | ICD-10-CM | POA: Diagnosis not present

## 2017-12-10 DIAGNOSIS — M79662 Pain in left lower leg: Secondary | ICD-10-CM | POA: Diagnosis not present

## 2017-12-10 DIAGNOSIS — B958 Unspecified staphylococcus as the cause of diseases classified elsewhere: Secondary | ICD-10-CM | POA: Diagnosis not present

## 2017-12-14 DIAGNOSIS — M79662 Pain in left lower leg: Secondary | ICD-10-CM | POA: Diagnosis not present

## 2017-12-14 DIAGNOSIS — B958 Unspecified staphylococcus as the cause of diseases classified elsewhere: Secondary | ICD-10-CM | POA: Diagnosis not present

## 2017-12-14 DIAGNOSIS — S81802S Unspecified open wound, left lower leg, sequela: Secondary | ICD-10-CM | POA: Diagnosis not present

## 2017-12-21 DIAGNOSIS — M79662 Pain in left lower leg: Secondary | ICD-10-CM | POA: Diagnosis not present

## 2017-12-21 DIAGNOSIS — S81802S Unspecified open wound, left lower leg, sequela: Secondary | ICD-10-CM | POA: Diagnosis not present

## 2017-12-21 DIAGNOSIS — Z139 Encounter for screening, unspecified: Secondary | ICD-10-CM | POA: Diagnosis not present

## 2017-12-24 DIAGNOSIS — S81812A Laceration without foreign body, left lower leg, initial encounter: Secondary | ICD-10-CM | POA: Diagnosis not present

## 2017-12-24 DIAGNOSIS — Z6823 Body mass index (BMI) 23.0-23.9, adult: Secondary | ICD-10-CM | POA: Diagnosis not present

## 2018-01-03 DIAGNOSIS — S81812A Laceration without foreign body, left lower leg, initial encounter: Secondary | ICD-10-CM | POA: Diagnosis not present

## 2018-01-20 DIAGNOSIS — J309 Allergic rhinitis, unspecified: Secondary | ICD-10-CM | POA: Diagnosis not present

## 2018-01-20 DIAGNOSIS — H6501 Acute serous otitis media, right ear: Secondary | ICD-10-CM | POA: Diagnosis not present

## 2018-01-20 DIAGNOSIS — J3489 Other specified disorders of nose and nasal sinuses: Secondary | ICD-10-CM | POA: Diagnosis not present

## 2018-01-24 DIAGNOSIS — Z139 Encounter for screening, unspecified: Secondary | ICD-10-CM | POA: Diagnosis not present

## 2018-01-24 DIAGNOSIS — I10 Essential (primary) hypertension: Secondary | ICD-10-CM | POA: Diagnosis not present

## 2018-01-24 DIAGNOSIS — Z1331 Encounter for screening for depression: Secondary | ICD-10-CM | POA: Diagnosis not present

## 2018-01-24 DIAGNOSIS — N183 Chronic kidney disease, stage 3 (moderate): Secondary | ICD-10-CM | POA: Diagnosis not present

## 2018-01-24 DIAGNOSIS — Z79899 Other long term (current) drug therapy: Secondary | ICD-10-CM | POA: Diagnosis not present

## 2018-01-24 DIAGNOSIS — Z9181 History of falling: Secondary | ICD-10-CM | POA: Diagnosis not present

## 2018-01-24 DIAGNOSIS — E781 Pure hyperglyceridemia: Secondary | ICD-10-CM | POA: Diagnosis not present

## 2018-02-17 DIAGNOSIS — Z9181 History of falling: Secondary | ICD-10-CM | POA: Diagnosis not present

## 2018-02-17 DIAGNOSIS — Z139 Encounter for screening, unspecified: Secondary | ICD-10-CM | POA: Diagnosis not present

## 2018-02-17 DIAGNOSIS — Z0000 Encounter for general adult medical examination without abnormal findings: Secondary | ICD-10-CM | POA: Diagnosis not present

## 2018-03-11 DIAGNOSIS — S41112A Laceration without foreign body of left upper arm, initial encounter: Secondary | ICD-10-CM | POA: Diagnosis not present

## 2018-03-11 DIAGNOSIS — Z6822 Body mass index (BMI) 22.0-22.9, adult: Secondary | ICD-10-CM | POA: Diagnosis not present

## 2018-04-01 DIAGNOSIS — H109 Unspecified conjunctivitis: Secondary | ICD-10-CM | POA: Diagnosis not present

## 2018-04-01 DIAGNOSIS — J309 Allergic rhinitis, unspecified: Secondary | ICD-10-CM | POA: Diagnosis not present

## 2018-07-28 DIAGNOSIS — Z23 Encounter for immunization: Secondary | ICD-10-CM | POA: Diagnosis not present

## 2018-07-28 DIAGNOSIS — Z79899 Other long term (current) drug therapy: Secondary | ICD-10-CM | POA: Diagnosis not present

## 2018-07-28 DIAGNOSIS — E781 Pure hyperglyceridemia: Secondary | ICD-10-CM | POA: Diagnosis not present

## 2018-07-28 DIAGNOSIS — I10 Essential (primary) hypertension: Secondary | ICD-10-CM | POA: Diagnosis not present

## 2018-07-28 DIAGNOSIS — N183 Chronic kidney disease, stage 3 (moderate): Secondary | ICD-10-CM | POA: Diagnosis not present

## 2018-08-30 DIAGNOSIS — Z23 Encounter for immunization: Secondary | ICD-10-CM | POA: Diagnosis not present

## 2018-10-24 DIAGNOSIS — E7800 Pure hypercholesterolemia, unspecified: Secondary | ICD-10-CM | POA: Diagnosis not present

## 2018-10-24 DIAGNOSIS — E039 Hypothyroidism, unspecified: Secondary | ICD-10-CM | POA: Diagnosis not present

## 2018-10-24 DIAGNOSIS — Z139 Encounter for screening, unspecified: Secondary | ICD-10-CM | POA: Diagnosis not present

## 2018-10-24 DIAGNOSIS — N390 Urinary tract infection, site not specified: Secondary | ICD-10-CM | POA: Diagnosis not present

## 2018-10-24 DIAGNOSIS — E119 Type 2 diabetes mellitus without complications: Secondary | ICD-10-CM | POA: Diagnosis not present

## 2018-10-24 DIAGNOSIS — S8992XA Unspecified injury of left lower leg, initial encounter: Secondary | ICD-10-CM | POA: Diagnosis not present

## 2018-11-03 DIAGNOSIS — D492 Neoplasm of unspecified behavior of bone, soft tissue, and skin: Secondary | ICD-10-CM | POA: Diagnosis not present

## 2018-11-03 DIAGNOSIS — S81812A Laceration without foreign body, left lower leg, initial encounter: Secondary | ICD-10-CM | POA: Diagnosis not present

## 2018-11-03 DIAGNOSIS — Z6824 Body mass index (BMI) 24.0-24.9, adult: Secondary | ICD-10-CM | POA: Diagnosis not present

## 2018-11-16 DIAGNOSIS — I4891 Unspecified atrial fibrillation: Secondary | ICD-10-CM

## 2018-11-16 HISTORY — DX: Unspecified atrial fibrillation: I48.91

## 2018-11-29 DIAGNOSIS — J019 Acute sinusitis, unspecified: Secondary | ICD-10-CM | POA: Diagnosis not present

## 2018-11-29 DIAGNOSIS — Z6822 Body mass index (BMI) 22.0-22.9, adult: Secondary | ICD-10-CM | POA: Diagnosis not present

## 2019-01-26 DIAGNOSIS — I10 Essential (primary) hypertension: Secondary | ICD-10-CM | POA: Diagnosis not present

## 2019-01-26 DIAGNOSIS — Z79899 Other long term (current) drug therapy: Secondary | ICD-10-CM | POA: Diagnosis not present

## 2019-01-26 DIAGNOSIS — E781 Pure hyperglyceridemia: Secondary | ICD-10-CM | POA: Diagnosis not present

## 2019-01-26 DIAGNOSIS — N183 Chronic kidney disease, stage 3 (moderate): Secondary | ICD-10-CM | POA: Diagnosis not present

## 2019-02-07 DIAGNOSIS — H6503 Acute serous otitis media, bilateral: Secondary | ICD-10-CM | POA: Diagnosis not present

## 2019-02-07 DIAGNOSIS — J019 Acute sinusitis, unspecified: Secondary | ICD-10-CM | POA: Diagnosis not present

## 2019-02-20 DIAGNOSIS — Z9181 History of falling: Secondary | ICD-10-CM | POA: Diagnosis not present

## 2019-02-20 DIAGNOSIS — E785 Hyperlipidemia, unspecified: Secondary | ICD-10-CM | POA: Diagnosis not present

## 2019-02-20 DIAGNOSIS — Z139 Encounter for screening, unspecified: Secondary | ICD-10-CM | POA: Diagnosis not present

## 2019-02-20 DIAGNOSIS — Z0000 Encounter for general adult medical examination without abnormal findings: Secondary | ICD-10-CM | POA: Diagnosis not present

## 2019-03-09 DIAGNOSIS — R300 Dysuria: Secondary | ICD-10-CM | POA: Diagnosis not present

## 2019-03-09 DIAGNOSIS — N390 Urinary tract infection, site not specified: Secondary | ICD-10-CM | POA: Diagnosis not present

## 2019-05-25 DIAGNOSIS — Z6823 Body mass index (BMI) 23.0-23.9, adult: Secondary | ICD-10-CM | POA: Diagnosis not present

## 2019-05-25 DIAGNOSIS — S81811A Laceration without foreign body, right lower leg, initial encounter: Secondary | ICD-10-CM | POA: Diagnosis not present

## 2019-06-01 DIAGNOSIS — M25571 Pain in right ankle and joints of right foot: Secondary | ICD-10-CM | POA: Diagnosis not present

## 2019-06-01 DIAGNOSIS — S91001A Unspecified open wound, right ankle, initial encounter: Secondary | ICD-10-CM | POA: Diagnosis not present

## 2019-07-18 DIAGNOSIS — E781 Pure hyperglyceridemia: Secondary | ICD-10-CM | POA: Diagnosis not present

## 2019-07-18 DIAGNOSIS — Z79899 Other long term (current) drug therapy: Secondary | ICD-10-CM | POA: Diagnosis not present

## 2019-07-18 DIAGNOSIS — I10 Essential (primary) hypertension: Secondary | ICD-10-CM | POA: Diagnosis not present

## 2019-07-18 DIAGNOSIS — N183 Chronic kidney disease, stage 3 (moderate): Secondary | ICD-10-CM | POA: Diagnosis not present

## 2019-08-01 DIAGNOSIS — R000 Tachycardia, unspecified: Secondary | ICD-10-CM | POA: Diagnosis not present

## 2019-08-01 DIAGNOSIS — Z6823 Body mass index (BMI) 23.0-23.9, adult: Secondary | ICD-10-CM | POA: Diagnosis not present

## 2019-08-01 DIAGNOSIS — L03115 Cellulitis of right lower limb: Secondary | ICD-10-CM | POA: Diagnosis not present

## 2019-08-09 DIAGNOSIS — L03115 Cellulitis of right lower limb: Secondary | ICD-10-CM | POA: Diagnosis not present

## 2019-08-09 DIAGNOSIS — R000 Tachycardia, unspecified: Secondary | ICD-10-CM | POA: Diagnosis not present

## 2019-08-15 DIAGNOSIS — Z6822 Body mass index (BMI) 22.0-22.9, adult: Secondary | ICD-10-CM | POA: Diagnosis not present

## 2019-08-15 DIAGNOSIS — I10 Essential (primary) hypertension: Secondary | ICD-10-CM | POA: Diagnosis not present

## 2019-08-15 DIAGNOSIS — I4891 Unspecified atrial fibrillation: Secondary | ICD-10-CM | POA: Diagnosis not present

## 2019-08-15 DIAGNOSIS — L03115 Cellulitis of right lower limb: Secondary | ICD-10-CM | POA: Diagnosis not present

## 2019-08-15 DIAGNOSIS — E781 Pure hyperglyceridemia: Secondary | ICD-10-CM | POA: Diagnosis not present

## 2019-08-21 ENCOUNTER — Ambulatory Visit: Payer: Medicare Other | Admitting: Internal Medicine

## 2019-08-21 NOTE — Progress Notes (Deleted)
   New Outpatient Visit Date: 08/21/2019  Referring Provider: Suzan Garibaldi, [place] [address] [place],  [place] [address]  Chief Complaint: ***  HPI:  Kristen Graham is a 85 y.o. female who is being seen today for the evaluation of atrial fibrillation at the request of Suzan Garibaldi, [place]. She has a history of ***. ***  --------------------------------------------------------------------------------------------------  Cardiovascular History & Procedures: Cardiovascular Problems:  ***  Risk Factors:  ***  Cath/PCI:  ***  CV Surgery:  ***  EP Procedures and Devices:  ***  Non-Invasive Evaluation(s):  ***  Recent CV Pertinent Labs: Lab Results  Component Value Date   K 3.6 04/06/2015   BUN 26 (H) 04/06/2015   CREATININE 1.08 (H) 04/06/2015    --------------------------------------------------------------------------------------------------  Past Medical History:  Diagnosis Date  . Hypertension   . Left wrist fracture     Past Surgical History:  Procedure Laterality Date  . CATARACT EXTRACTION    . I&D EXTREMITY Left 04/06/2015   Procedure: IRRIGATION AND DEBRIDEMENT left forearm;  Surgeon: Hessie Knows, MD;  Location: [facility] ORS;  Service: Orthopedics;  Laterality: Left;  . I&D leg    . ORIF RADIAL FRACTURE Left 04/06/2015   Procedure: OPEN REDUCTION INTERNAL FIXATION (ORIF) RADIAL FRACTURE;  Surgeon: Hessie Knows, MD;  Location: [facility] ORS;  Service: Orthopedics;  Laterality: Left;    No outpatient medications have been marked as taking for the 08/21/19 encounter (Appointment) with Gianluca Chhim, Harrell Gave, MD.    Allergies: Sulfa antibiotics  Social History   Tobacco Use  . Smoking status: Never Smoker  Substance Use Topics  . Alcohol use: No  . Drug use: Not on file    No family history on file.  Review of Systems: A 12-system review of systems was performed and was negative except as noted in the HPI.   --------------------------------------------------------------------------------------------------  Physical Exam: There were no vitals taken for this visit.  General:  *** HEENT: No conjunctival pallor or scleral icterus. Moist mucous membranes. OP clear. Neck: Supple without lymphadenopathy, thyromegaly, JVD, or HJR. No carotid bruit. Lungs: Normal work of breathing. Clear to auscultation bilaterally without wheezes or crackles. Heart: Regular rate and rhythm without murmurs, rubs, or gallops. Non-displaced PMI. Abd: Bowel sounds present. Soft, NT/ND without hepatosplenomegaly Ext: No lower extremity edema. Radial, PT, and DP pulses are 2+ bilaterally Skin: Warm and dry without rash. Neuro: CNIII-XII intact. Strength and fine-touch sensation intact in upper and lower extremities bilaterally. Psych: Normal mood and affect.  EKG:  ***  Lab Results  Component Value Date   WBC 8.6 04/06/2015   HGB 13.0 04/06/2015   HCT 39.7 04/06/2015   MCV 92.7 04/06/2015   PLT 169 04/06/2015    Lab Results  Component Value Date   NA 135 04/06/2015   K 3.6 04/06/2015   CL 101 04/06/2015   CO2 27 04/06/2015   BUN 26 (H) 04/06/2015   CREATININE 1.08 (H) 04/06/2015   GLUCOSE 97 04/06/2015   ALT 17 04/06/2015    No results found for: CHOL, HDL, LDLCALC, LDLDIRECT, TRIG, CHOLHDL   --------------------------------------------------------------------------------------------------  ASSESSMENT AND PLAN: ***  [name], MD 08/21/2019 7:30 AM

## 2019-08-23 DIAGNOSIS — N390 Urinary tract infection, site not specified: Secondary | ICD-10-CM | POA: Diagnosis not present

## 2019-08-23 DIAGNOSIS — R300 Dysuria: Secondary | ICD-10-CM | POA: Diagnosis not present

## 2019-08-28 ENCOUNTER — Ambulatory Visit (INDEPENDENT_AMBULATORY_CARE_PROVIDER_SITE_OTHER): Payer: Medicare Other | Admitting: Internal Medicine

## 2019-08-28 ENCOUNTER — Encounter: Payer: Self-pay | Admitting: Internal Medicine

## 2019-08-28 ENCOUNTER — Other Ambulatory Visit: Payer: Self-pay

## 2019-08-28 VITALS — BP 106/82 | HR 112 | Ht 62.0 in | Wt 137.5 lb

## 2019-08-28 DIAGNOSIS — M7989 Other specified soft tissue disorders: Secondary | ICD-10-CM | POA: Diagnosis not present

## 2019-08-28 DIAGNOSIS — I4819 Other persistent atrial fibrillation: Secondary | ICD-10-CM

## 2019-08-28 DIAGNOSIS — I10 Essential (primary) hypertension: Secondary | ICD-10-CM | POA: Diagnosis not present

## 2019-08-28 DIAGNOSIS — L97211 Non-pressure chronic ulcer of right calf limited to breakdown of skin: Secondary | ICD-10-CM | POA: Diagnosis not present

## 2019-08-28 MED ORDER — METOPROLOL SUCCINATE ER 25 MG PO TB24: 25.0000 mg | ORAL_TABLET | Freq: Every day | ORAL | 1 refills | Status: DC

## 2019-08-28 MED ORDER — RIVAROXABAN 15 MG PO TABS: 15.0000 mg | ORAL_TABLET | Freq: Every day | ORAL | 1 refills | Status: DC

## 2019-08-28 MED ORDER — TRIAMTERENE-HCTZ 37.5-25 MG PO TABS: 1.0000 | ORAL_TABLET | Freq: Every day | ORAL | 1 refills | Status: DC

## 2019-08-28 NOTE — Progress Notes (Signed)
New Outpatient Visit Date: 08/28/2019  Referring Provider: Suzan Garibaldi, [address],  [place] [address]  Chief Complaint: Atrial fibrillation  HPI:  Kristen Graham is a 85 y.o. female who is being seen today for the evaluation of recently diagnosed atrial fibrillation at the request of Suzan Garibaldi, [place]. She has a history of hypertension, chronic kidney disease stage III, prediabetes and hyperlipidemia.  She was recently evaluated by her PCP due to leg swelling and wounds and was noted to be tachycardic.  EKG showed a-fib with RVR, prompting initiation of metoprolol and rivaroxaban.  Leg swelling has continued, though leg wounds are gradually improving.  Kristen Graham reports chronic swelling that was worsened after being bitten in her legs by a fox !5 years ago.  She has been applying calamine lotion to the wounds on her calves.  She denies pain in either leg.  Kristen Graham denies a history of prior heart disease.  She has not experienced any chest pain, shortness of breath, palpitations, and lightheadedness.  She remains very active, working in her garden most days.  She reports a single episode of feeling dizzy that she attributes to having taken metoprolol and rivaroxaban too close together.  She does not monitor her blood pressure or heart rate regularly at home.  --------------------------------------------------------------------------------------------------  Cardiovascular History & Procedures: Cardiovascular Problems:  Atrial fibrillation  Risk Factors:  Hypertension, hyperlipidemia, prediabetes, and age > 42  Cath/PCI:  None  CV Surgery:  None  EP Procedures and Devices:  None  Non-Invasive Evaluation(s):  None  Recent CV Pertinent Labs: Lab Results  Component Value Date   K 3.6 04/06/2015   BUN 26 (H) 04/06/2015   CREATININE 1.08 (H) 04/06/2015    --------------------------------------------------------------------------------------------------   Past Medical History:  Diagnosis Date  . Atrial fibrillation ([place]) 2020  . Glaucoma   . Hyperlipidemia   . Hypertension   . Left wrist fracture   . Prediabetes     Past Surgical History:  Procedure Laterality Date  . CATARACT EXTRACTION    . I&D EXTREMITY Left 04/06/2015   Procedure: IRRIGATION AND DEBRIDEMENT left forearm;  Surgeon: Hessie Knows, MD;  Location: [facility] ORS;  Service: Orthopedics;  Laterality: Left;  . I&D leg    . ORIF RADIAL FRACTURE Left 04/06/2015   Procedure: OPEN REDUCTION INTERNAL FIXATION (ORIF) RADIAL FRACTURE;  Surgeon: Hessie Knows, MD;  Location: [facility] ORS;  Service: Orthopedics;  Laterality: Left;    Current Meds  Medication Sig  . ALPRAZolam (XANAX) 1 MG tablet Take 0.5 mg by mouth 2 (two) times daily.  Marland Kitchen latanoprost (XALATAN) 0.005 % ophthalmic solution Place 1 drop into both eyes at bedtime.  . metoprolol succinate (TOPROL-XL) 25 MG 24 hr tablet Take 1 tablet (25 mg total) by mouth daily.  Marland Kitchen triamterene-hydrochlorothiazide (MAXZIDE-25) 37.5-25 MG tablet Take 1 tablet by mouth daily.  . [DISCONTINUED] metoprolol succinate (TOPROL-XL) 25 MG 24 hr tablet Take 12.5 mg by mouth daily.  . [DISCONTINUED] rivaroxaban (XARELTO) 20 MG TABS tablet Take 20 mg by mouth daily with supper.  . [DISCONTINUED] triamterene-hydrochlorothiazide (MAXZIDE-25) 37.5-25 MG per tablet Take 1.5 tablets by mouth daily.    Allergies: Sulfa antibiotics  Social History   Tobacco Use  . Smoking status: Never Smoker  . Smokeless tobacco: Never Used  Substance Use Topics  . Alcohol use: No  . Drug use: Never    Family History  Problem Relation Age of Onset  . Stroke Mother   . Arrhythmia Son  Review of Systems: A 12-system review of systems was performed and was negative except as noted in the HPI.  --------------------------------------------------------------------------------------------------  Physical Exam: BP 106/82 (BP Location: Left Arm, Patient Position:  Sitting, Cuff Size: Normal)   Pulse (!) 112   Ht 5\' 2"  (1.575 m)   Wt 137 lb 8 oz (62.4 kg)   SpO2 98%   BMI 25.15 kg/m   General:  NAD.  Accompanied by her son, who is also a patient of mine. HEENT: No conjunctival pallor or scleral icterus. Moist mucous membranes. OP clear. Neck: Supple without lymphadenopathy, thyromegaly, JVD, or HJR. No carotid bruit. Lungs: Normal work of breathing. Clear to auscultation bilaterally without wheezes or crackles. Heart: Irregularly irregular without murmurs, rubs, or gallops. Abd: Bowel sounds present. Soft, NT/ND without hepatosplenomegaly Ext: Trace lower extremity edema bilaterally.  2+ radial and dorsalis pedis pulses.  Trace posterior tibial pulses. Skin: Warm and dry.  Superficial ulcers with scant serous drainage along right posterior calf.  No surrounding erythema. Neuro: CNIII-XII intact. Strength and fine-touch sensation intact in upper and lower extremities bilaterally. Psych: Normal mood and affect.  EKG:  Atrial fibrillation with rapid ventricular response (HR 104 bpm) with non-specific ST/T changes.  Lab Results  Component Value Date   WBC 8.6 04/06/2015   HGB 13.0 04/06/2015   HCT 39.7 04/06/2015   MCV 92.7 04/06/2015   PLT 169 04/06/2015    Lab Results  Component Value Date   NA 135 04/06/2015   K 3.6 04/06/2015   CL 101 04/06/2015   CO2 27 04/06/2015   BUN 26 (H) 04/06/2015   CREATININE 1.08 (H) 04/06/2015   GLUCOSE 97 04/06/2015   ALT 17 04/06/2015    No results found for: CHOL, HDL, LDLCALC, LDLDIRECT, TRIG, CHOLHDL   --------------------------------------------------------------------------------------------------  ASSESSMENT AND PLAN: Persistent atrial fibrillation: Ventricular rate is not ideally controlled today (HR 100-120 bpm).  Recent outside labs were unremarkable other than CKD with GFR < 40.  TSH was normal.  I recommend increasing metoprolol succinate to 25 mg daily and decreasing triamterene-HCTZ to  1 tablet daily (from 1.5 daily).  Given GFR < 50, we will decrease rivaroxaban to 15 mg daily.  We discussed restoration of sinus rhythm with cardioversion, but given that she is asymptomatic, Kristen Graham would like to defer this.  We would need to complete at least 4 weeks of anticoagulation before considering DCCV, anyway.  We will plan to have her return in ~2 weeks to see if she is still in a-fib at that time and readdress cardioversion.  We will arrange for a transthoracic echocardiogram to assess LVEF in the meantime.  Leg swelling: Potentially due to heart failure in the setting of prolonged a-fib with RVR, though Kristen Graham does not report other symptoms of decompensated heart failure.  Only trace pedeal edema is noted on exam today.  We will work on rate control, as above.  I encouraged Kristen Graham to elevate her legs.  Calf ulcers: Most like due to edema and venous stasis.  I encouraged her to keep the ulcers clean and to elevate her legs when possible.  She should follow-up with her PCP for wound checks.  If no improvement, referral to the wound care center as well as ABI's will need to be considered.  Hypertension: SBP low normal.  Given escalation of metoprolol for heart rate control, I will decrease triamterene-HCTZ to 1 tablet daily.  Follow-up: RTC in ~2 weeks.  [name], MD 08/29/2019 10:06 PM

## 2019-08-28 NOTE — Patient Instructions (Addendum)
Medication Instructions:  Your physician has recommended you make the following change in your medication:  1- DECREASE Triamterene/HCTZ to 1 tablet by mouth once a day. 2- INCREASE Metoprolol succinate to 25 mg (1 tablet) by mouth once a day. 3- CHANGE Xarelto to 15 mg by mouth once a day.  If you need a refill on your cardiac medications before your next appointment, please call your pharmacy.   Lab work: NONE If you have labs (blood work) drawn today and your tests are completely normal, you will receive your results only by: Marland Kitchen MyChart Message (if you have MyChart) OR . A paper copy in the mail If you have any lab test that is abnormal or we need to change your treatment, we will call you to review the results.  Testing/Procedures: Your physician has requested that you have an echocardiogram. Echocardiography is a painless test that uses sound waves to create images of your heart. It provides your doctor with information about the size and shape of your heart and how well your heart's chambers and valves are working. This procedure takes approximately one hour. There are no restrictions for this procedure. You may get an IV, if needed, to receive an ultrasound enhancing agent through to better visualize your heart.    Follow-Up: At [facility], you and your health needs are our priority.  As part of our continuing mission to provide you with exceptional heart care, we have created designated Provider Care Teams.  These Care Teams include your primary Cardiologist (physician) and Advanced Practice Providers (APPs -  Physician Assistants and Nurse Practitioners) who all work together to provide you with the care you need, when you need it. You will need a follow up appointment in 2 weeks.  You may see DR Harrell Gave END or one of the following Advanced Practice Providers on your designated Care Team:   Joiner Hodgkins, NP Christell Faith, PA-C . Marrianne Mood,  PA-C    Echocardiogram An echocardiogram is a procedure that uses painless sound waves (ultrasound) to produce an image of the heart. Images from an echocardiogram can provide important information about:  Signs of coronary artery disease (CAD).  Aneurysm detection. An aneurysm is a weak or damaged part of an artery wall that bulges out from the normal force of blood pumping through the body.  Heart size and shape. Changes in the size or shape of the heart can be associated with certain conditions, including heart failure, aneurysm, and CAD.  Heart muscle function.  Heart valve function.  Signs of a past heart attack.  Fluid buildup around the heart.  Thickening of the heart muscle.  A tumor or infectious growth around the heart valves. Tell a health care provider about:  Any allergies you have.  All medicines you are taking, including vitamins, herbs, eye drops, creams, and over-the-counter medicines.  Any blood disorders you have.  Any surgeries you have had.  Any medical conditions you have.  Whether you are pregnant or may be pregnant. What are the risks? Generally, this is a safe procedure. However, problems may occur, including:  Allergic reaction to dye (contrast) that may be used during the procedure. What happens before the procedure? No specific preparation is needed. You may eat and drink normally. What happens during the procedure?   An IV tube may be inserted into one of your veins.  You may receive contrast through this tube. A contrast is an injection that improves the quality of the pictures from your heart.  A gel will be applied to your chest.  A wand-like tool (transducer) will be moved over your chest. The gel will help to transmit the sound waves from the transducer.  The sound waves will harmlessly bounce off of your heart to allow the heart images to be captured in real-time motion. The images will be recorded on a computer. The procedure  may vary among health care providers and hospitals. What happens after the procedure?  You may return to your normal, everyday life, including diet, activities, and medicines, unless your health care provider tells you not to do that. Summary  An echocardiogram is a procedure that uses painless sound waves (ultrasound) to produce an image of the heart.  Images from an echocardiogram can provide important information about the size and shape of your heart, heart muscle function, heart valve function, and fluid buildup around your heart.  You do not need to do anything to prepare before this procedure. You may eat and drink normally.  After the echocardiogram is completed, you may return to your normal, everyday life, unless your health care provider tells you not to do that. This information is not intended to replace advice given to you by your health care provider. Make sure you discuss any questions you have with your health care provider. Document Released: 10/30/2000 Document Revised: 02/23/2019 Document Reviewed: 12/05/2016 Elsevier Patient Education  2020 [employer].

## 2019-08-29 ENCOUNTER — Encounter: Payer: Self-pay | Admitting: Internal Medicine

## 2019-08-29 DIAGNOSIS — I10 Essential (primary) hypertension: Secondary | ICD-10-CM | POA: Insufficient documentation

## 2019-08-29 DIAGNOSIS — L97211 Non-pressure chronic ulcer of right calf limited to breakdown of skin: Secondary | ICD-10-CM | POA: Insufficient documentation

## 2019-08-29 DIAGNOSIS — I4819 Other persistent atrial fibrillation: Secondary | ICD-10-CM | POA: Insufficient documentation

## 2019-08-29 DIAGNOSIS — M7989 Other specified soft tissue disorders: Secondary | ICD-10-CM | POA: Insufficient documentation

## 2019-09-05 DIAGNOSIS — Z6823 Body mass index (BMI) 23.0-23.9, adult: Secondary | ICD-10-CM | POA: Diagnosis not present

## 2019-09-05 DIAGNOSIS — I4891 Unspecified atrial fibrillation: Secondary | ICD-10-CM | POA: Diagnosis not present

## 2019-09-05 DIAGNOSIS — Z23 Encounter for immunization: Secondary | ICD-10-CM | POA: Diagnosis not present

## 2019-09-05 DIAGNOSIS — I83019 Varicose veins of right lower extremity with ulcer of unspecified site: Secondary | ICD-10-CM | POA: Diagnosis not present

## 2019-09-05 DIAGNOSIS — L97919 Non-pressure chronic ulcer of unspecified part of right lower leg with unspecified severity: Secondary | ICD-10-CM | POA: Diagnosis not present

## 2019-09-07 DIAGNOSIS — I83018 Varicose veins of right lower extremity with ulcer other part of lower leg: Secondary | ICD-10-CM | POA: Diagnosis not present

## 2019-09-07 DIAGNOSIS — L97819 Non-pressure chronic ulcer of other part of right lower leg with unspecified severity: Secondary | ICD-10-CM | POA: Diagnosis not present

## 2019-09-07 DIAGNOSIS — Z4800 Encounter for change or removal of nonsurgical wound dressing: Secondary | ICD-10-CM | POA: Diagnosis not present

## 2019-09-07 DIAGNOSIS — I4891 Unspecified atrial fibrillation: Secondary | ICD-10-CM | POA: Diagnosis not present

## 2019-09-07 DIAGNOSIS — Z7902 Long term (current) use of antithrombotics/antiplatelets: Secondary | ICD-10-CM | POA: Diagnosis not present

## 2019-09-07 DIAGNOSIS — I10 Essential (primary) hypertension: Secondary | ICD-10-CM | POA: Diagnosis not present

## 2019-09-07 DIAGNOSIS — Z79899 Other long term (current) drug therapy: Secondary | ICD-10-CM | POA: Diagnosis not present

## 2019-09-11 DIAGNOSIS — Z7902 Long term (current) use of antithrombotics/antiplatelets: Secondary | ICD-10-CM | POA: Diagnosis not present

## 2019-09-11 DIAGNOSIS — I4891 Unspecified atrial fibrillation: Secondary | ICD-10-CM | POA: Diagnosis not present

## 2019-09-11 DIAGNOSIS — I10 Essential (primary) hypertension: Secondary | ICD-10-CM | POA: Diagnosis not present

## 2019-09-11 DIAGNOSIS — L97819 Non-pressure chronic ulcer of other part of right lower leg with unspecified severity: Secondary | ICD-10-CM | POA: Diagnosis not present

## 2019-09-11 DIAGNOSIS — I83018 Varicose veins of right lower extremity with ulcer other part of lower leg: Secondary | ICD-10-CM | POA: Diagnosis not present

## 2019-09-11 DIAGNOSIS — Z79899 Other long term (current) drug therapy: Secondary | ICD-10-CM | POA: Diagnosis not present

## 2019-09-11 DIAGNOSIS — Z4800 Encounter for change or removal of nonsurgical wound dressing: Secondary | ICD-10-CM | POA: Diagnosis not present

## 2019-09-12 ENCOUNTER — Ambulatory Visit (INDEPENDENT_AMBULATORY_CARE_PROVIDER_SITE_OTHER): Payer: Medicare Other

## 2019-09-12 ENCOUNTER — Other Ambulatory Visit: Payer: Self-pay

## 2019-09-12 DIAGNOSIS — M7989 Other specified soft tissue disorders: Secondary | ICD-10-CM

## 2019-09-12 DIAGNOSIS — I4819 Other persistent atrial fibrillation: Secondary | ICD-10-CM | POA: Diagnosis not present

## 2019-09-13 ENCOUNTER — Ambulatory Visit (INDEPENDENT_AMBULATORY_CARE_PROVIDER_SITE_OTHER): Payer: Medicare Other | Admitting: Internal Medicine

## 2019-09-13 ENCOUNTER — Encounter: Payer: Self-pay | Admitting: Internal Medicine

## 2019-09-13 VITALS — BP 110/80 | HR 110 | Ht 62.0 in | Wt 140.2 lb

## 2019-09-13 DIAGNOSIS — I5033 Acute on chronic diastolic (congestive) heart failure: Secondary | ICD-10-CM

## 2019-09-13 DIAGNOSIS — I10 Essential (primary) hypertension: Secondary | ICD-10-CM

## 2019-09-13 DIAGNOSIS — I4819 Other persistent atrial fibrillation: Secondary | ICD-10-CM | POA: Diagnosis not present

## 2019-09-13 DIAGNOSIS — Z79899 Other long term (current) drug therapy: Secondary | ICD-10-CM | POA: Diagnosis not present

## 2019-09-13 DIAGNOSIS — I83018 Varicose veins of right lower extremity with ulcer other part of lower leg: Secondary | ICD-10-CM | POA: Diagnosis not present

## 2019-09-13 DIAGNOSIS — Z7902 Long term (current) use of antithrombotics/antiplatelets: Secondary | ICD-10-CM | POA: Diagnosis not present

## 2019-09-13 DIAGNOSIS — Z4800 Encounter for change or removal of nonsurgical wound dressing: Secondary | ICD-10-CM | POA: Diagnosis not present

## 2019-09-13 DIAGNOSIS — L97819 Non-pressure chronic ulcer of other part of right lower leg with unspecified severity: Secondary | ICD-10-CM | POA: Diagnosis not present

## 2019-09-13 DIAGNOSIS — I4891 Unspecified atrial fibrillation: Secondary | ICD-10-CM | POA: Diagnosis not present

## 2019-09-13 MED ORDER — FUROSEMIDE 40 MG PO TABS: 40.0000 mg | ORAL_TABLET | Freq: Every day | ORAL | 3 refills | Status: DC

## 2019-09-13 MED ORDER — METOPROLOL SUCCINATE ER 25 MG PO TB24: 25.0000 mg | ORAL_TABLET | Freq: Two times a day (BID) | ORAL | 3 refills | Status: DC

## 2019-09-13 NOTE — Progress Notes (Signed)
Follow-up Outpatient Visit Date: 09/13/2019  Primary Care Provider: [place] Pal[place], [place] [address]  Chief Complaint: Follow-up atrial fibrillation and leg edema  HPI:  Kristen Graham is a 85 y.o. year-old female with history of recently diagnosed atrial fibrillation, hypertension, prediabetes, hyperlipidemia, and chronic kidney disease stage III, who presents for follow-up of atrial fibrillation.  I met Kristen Graham approximately 2 weeks ago after she was found to be in atrial fibrillation with rapid ventricular response by her PCP.  She had also experienced worsening leg swelling and development of calf wounds.  She had already been placed on rivaroxaban by her PCP.  Due to suboptimal tracheal rate control, metoprolol succinate was increased to 25 mg daily.  We also decreased rivaroxaban to 15 mg daily based on GFR.  We discussed role for cardioversion, though Kristen Graham wished to continue with a rate control strategy, as she was asymptomatic.  Today, Kristen Graham reports that she continues to feel well.  Her leg wounds are improving with regular wound care.  She still has some edema in her legs, right greater than the left.  She denies chest pain, shortness of breath, palpitations, and lightheadedness.  She is able to perform all of her usual activities including working outside without any difficulty.  She is tolerating metoprolol and rivaroxaban without side effects, including bleeding.  --------------------------------------------------------------------------------------------------  Cardiovascular History & Procedures: Cardiovascular Problems:  Atrial fibrillation  HFpEF  Mitral, tricuspid, and aortic regurgitation  Risk Factors:  Hypertension, hyperlipidemia, prediabetes, and age > 97  Cath/PCI:  None  CV Surgery:  None  EP Procedures and Devices:  None  Non-Invasive Evaluation(s):  TTE (09/12/2019): Normal LV size.  LVEF 55-60% with moderate  focal thickening of the basal septum.  Normal RV systolic function with mild dilation.  Mild to moderate left atrial enlargement.  Mild right atrial enlargement.  Moderate mitral and severe tricuspid regurgitation.  Mild aortic regurgitation.  Elevated central venous pressure.  Mild pulmonary hypertension.  Recent CV Pertinent Labs: Lab Results  Component Value Date   K 3.7 09/13/2019   BUN 19 09/13/2019   CREATININE 0.95 09/13/2019    Past medical and surgical history were reviewed and updated in EPIC.  Current Meds  Medication Sig  . ALPRAZolam (XANAX) 1 MG tablet Take 0.5 mg by mouth 2 (two) times daily.  . cephALEXin (KEFLEX) 500 MG capsule Take 500 mg by mouth 3 (three) times daily.  Marland Kitchen latanoprost (XALATAN) 0.005 % ophthalmic solution Place 1 drop into both eyes at bedtime.  . metoprolol succinate (TOPROL-XL) 25 MG 24 hr tablet Take 1 tablet (25 mg total) by mouth 2 (two) times daily.  . Rivaroxaban (XARELTO) 15 MG TABS tablet Take 1 tablet (15 mg total) by mouth daily with supper.  . [DISCONTINUED] metoprolol succinate (TOPROL-XL) 25 MG 24 hr tablet Take 1 tablet (25 mg total) by mouth daily.  . [DISCONTINUED] triamterene-hydrochlorothiazide (MAXZIDE-25) 37.5-25 MG tablet Take 1 tablet by mouth daily.    Allergies: Sulfa antibiotics  Social History   Tobacco Use  . Smoking status: Never Smoker  . Smokeless tobacco: Never Used  Substance Use Topics  . Alcohol use: No  . Drug use: Never    Family History  Problem Relation Age of Onset  . Stroke Mother   . Arrhythmia Son     Review of Systems: A 12-system review of systems was performed and was negative except as noted in the HPI.  --------------------------------------------------------------------------------------------------  Physical Exam:  BP 110/80 (BP Location: Left Arm, Patient Position: Sitting, Cuff Size: Normal)   Pulse (!) 110   Ht _0  (1.575 m)   Wt 140 lb 4 oz (63.6 kg)   SpO2 97%   BMI 25.65  kg/m   General: Elderly woman, seated comfortably in the exam room.  She is accompanied by her son. HEENT: No conjunctival pallor or scleral icterus.  Facemask in place. Neck: Supple without lymphadenopathy, thyromegaly, JVD, or HJR. Lungs: Normal work of breathing. Clear to auscultation bilaterally without wheezes or crackles. Heart: Irregularly irregular with 1/6 systolic murmur.  No rubs or gallops. Abd: Bowel sounds present. Soft, NT/ND without hepatosplenomegaly Ext: Trace to 1+ edema, right greater than left.  Right calf wounds covered with clean dressing. Skin: Warm and dry without rash.  EKG: Atrial fibrillation with rapid ventricular response (ventricular rate 110 bpm), left axis deviation, and nonspecific ST/T changes.  Lab Results  Component Value Date   WBC 8.6 04/06/2015   HGB 13.0 04/06/2015   HCT 39.7 04/06/2015   MCV 92.7 04/06/2015   PLT 169 04/06/2015    Lab Results  Component Value Date   NA 138 09/13/2019   K 3.7 09/13/2019   CL 103 09/13/2019   CO2 22 09/13/2019   BUN 19 09/13/2019   CREATININE 0.95 09/13/2019   GLUCOSE 91 09/13/2019   ALT 17 04/06/2015    No results found for: CHOL, HDL, LDLCALC, LDLDIRECT, TRIG, CHOLHDL  --------------------------------------------------------------------------------------------------  ASSESSMENT AND PLAN: Persistent atrial fibrillation: Kristen Graham remains asymptomatic but has suboptimal ventricular rate control.  We will increase metoprolol succinate to 25 mg twice daily.  She will remain on rivaroxaban 15 mg daily based on most recent GFR.  We discussed rate versus rhythm control strategy.  Given that she is asymptomatic, Kristen Graham wishes to defer cardioversion for now.  I think a rate control strategy is reasonable as long as we are ultimately able to get her heart rate under adequate control.  Acute on chronic HFpEF: Kristen Graham continues to have lower extremity edema that is likely contributing to her right calf  wound.  Recent echo showed preserved LVEF but evidence of elevated central venous pressures and pulmonary hypertension in the setting of regurgitant valve disease.  I have recommended discontinuation of triamterene-HCTZ and initiation of furosemide 40 mg daily.  I will check a BMP today to determine the need for potassium supplementation.  Repeat BMP should be obtained when Kristen Graham returns for follow-up in 2 weeks.  Hypertension: Blood pressure reasonable today.  As above, we will increase metoprolol, stop triamterene-HCTZ, and add furosemide.  Follow-up: Return to clinic in 2-3 weeks.  [name], MD 09/14/2019 9:24 AM

## 2019-09-13 NOTE — Patient Instructions (Addendum)
Medication Instructions:  Your physician has recommended you make the following change in your medication:  1- STOP Triamterene/HCTZ. 2- INCREASE Metoprolol succinate to 25 mg by mouth two times a day. 3- START Furosemide 40 mg by mouth once a day.  *If you need a refill on your cardiac medications before your next appointment, please call your pharmacy*  Lab Work: Your physician recommends that you return for lab work in: [place].  If you have labs (blood work) drawn today and your tests are completely normal, you will receive your results only by: Marland Kitchen MyChart Message (if you have MyChart) OR . A paper copy in the mail If you have any lab test that is abnormal or we need to change your treatment, we will call you to review the results.  Testing/Procedures: NONE  Follow-Up: At [facility], you and your health needs are our priority.  As part of our continuing mission to provide you with exceptional heart care, we have created designated Provider Care Teams.  These Care Teams include your primary Cardiologist (physician) and Advanced Practice Providers (APPs -  Physician Assistants and Nurse Practitioners) who all work together to provide you with the care you need, when you need it.  Your next appointment:   2-3 weeks.  The format for your next appointment:   In Person  Provider:    You may see DR Harrell Gave END or one of the following Advanced Practice Providers on your designated Care Team:    Delmundo Hodgkins, NP  Christell Faith, PA-C  Marrianne Mood, PA-C

## 2019-09-14 ENCOUNTER — Encounter: Payer: Self-pay | Admitting: Internal Medicine

## 2019-09-14 DIAGNOSIS — I5033 Acute on chronic diastolic (congestive) heart failure: Secondary | ICD-10-CM | POA: Insufficient documentation

## 2019-09-14 DIAGNOSIS — I5032 Chronic diastolic (congestive) heart failure: Secondary | ICD-10-CM | POA: Insufficient documentation

## 2019-09-14 LAB — BASIC METABOLIC PANEL
BUN/Creatinine Ratio: 20 (ref 12–28)
BUN: 19 mg/dL (ref 8–27)
CO2: 22 mmol/L (ref 20–29)
Calcium: 9.6 mg/dL (ref 8.7–10.3)
Chloride: 103 mmol/L (ref 96–106)
Creatinine, Ser: 0.95 mg/dL (ref 0.57–1.00)
GFR calc Af Amer: 62 mL/min/{1.73_m2} (ref >59)
GFR calc non Af Amer: 54 mL/min/{1.73_m2} — ABNORMAL LOW (ref >59)
Glucose: 91 mg/dL (ref 65–99)
Potassium: 3.7 mmol/L (ref 3.5–5.2)
Sodium: 138 mmol/L (ref 134–144)

## 2019-09-15 ENCOUNTER — Telehealth: Payer: Self-pay | Admitting: *Deleted

## 2019-09-15 DIAGNOSIS — Z7902 Long term (current) use of antithrombotics/antiplatelets: Secondary | ICD-10-CM | POA: Diagnosis not present

## 2019-09-15 DIAGNOSIS — I10 Essential (primary) hypertension: Secondary | ICD-10-CM | POA: Diagnosis not present

## 2019-09-15 DIAGNOSIS — Z4800 Encounter for change or removal of nonsurgical wound dressing: Secondary | ICD-10-CM | POA: Diagnosis not present

## 2019-09-15 DIAGNOSIS — I4891 Unspecified atrial fibrillation: Secondary | ICD-10-CM | POA: Diagnosis not present

## 2019-09-15 DIAGNOSIS — I83018 Varicose veins of right lower extremity with ulcer other part of lower leg: Secondary | ICD-10-CM | POA: Diagnosis not present

## 2019-09-15 DIAGNOSIS — L97819 Non-pressure chronic ulcer of other part of right lower leg with unspecified severity: Secondary | ICD-10-CM | POA: Diagnosis not present

## 2019-09-15 DIAGNOSIS — Z79899 Other long term (current) drug therapy: Secondary | ICD-10-CM | POA: Diagnosis not present

## 2019-09-15 NOTE — Telephone Encounter (Signed)
-----   Message from [name], MD sent at 09/14/2019  6:40 AM EDT ----- Kidney function and electrolytes stable.  I recommend proceeding with medication changes initiated yesterday.  I addition, I recommend that Ms. Tyrell begin taking potassium chloride 20 mEq daily.

## 2019-09-15 NOTE — Telephone Encounter (Signed)
No answer. Left message to call back on mobile number. Home number went directly to busy signal.

## 2019-09-18 DIAGNOSIS — I10 Essential (primary) hypertension: Secondary | ICD-10-CM | POA: Diagnosis not present

## 2019-09-18 DIAGNOSIS — Z7902 Long term (current) use of antithrombotics/antiplatelets: Secondary | ICD-10-CM | POA: Diagnosis not present

## 2019-09-18 DIAGNOSIS — I83018 Varicose veins of right lower extremity with ulcer other part of lower leg: Secondary | ICD-10-CM | POA: Diagnosis not present

## 2019-09-18 DIAGNOSIS — Z79899 Other long term (current) drug therapy: Secondary | ICD-10-CM | POA: Diagnosis not present

## 2019-09-18 DIAGNOSIS — I4891 Unspecified atrial fibrillation: Secondary | ICD-10-CM | POA: Diagnosis not present

## 2019-09-18 DIAGNOSIS — L97819 Non-pressure chronic ulcer of other part of right lower leg with unspecified severity: Secondary | ICD-10-CM | POA: Diagnosis not present

## 2019-09-18 DIAGNOSIS — Z4800 Encounter for change or removal of nonsurgical wound dressing: Secondary | ICD-10-CM | POA: Diagnosis not present

## 2019-09-19 NOTE — Telephone Encounter (Signed)
No answer. Left message to call back.   

## 2019-09-20 DIAGNOSIS — I4891 Unspecified atrial fibrillation: Secondary | ICD-10-CM | POA: Diagnosis not present

## 2019-09-20 DIAGNOSIS — Z79899 Other long term (current) drug therapy: Secondary | ICD-10-CM | POA: Diagnosis not present

## 2019-09-20 DIAGNOSIS — Z7902 Long term (current) use of antithrombotics/antiplatelets: Secondary | ICD-10-CM | POA: Diagnosis not present

## 2019-09-20 DIAGNOSIS — I10 Essential (primary) hypertension: Secondary | ICD-10-CM | POA: Diagnosis not present

## 2019-09-20 DIAGNOSIS — L97819 Non-pressure chronic ulcer of other part of right lower leg with unspecified severity: Secondary | ICD-10-CM | POA: Diagnosis not present

## 2019-09-20 DIAGNOSIS — I83018 Varicose veins of right lower extremity with ulcer other part of lower leg: Secondary | ICD-10-CM | POA: Diagnosis not present

## 2019-09-20 DIAGNOSIS — Z4800 Encounter for change or removal of nonsurgical wound dressing: Secondary | ICD-10-CM | POA: Diagnosis not present

## 2019-09-20 MED ORDER — POTASSIUM CHLORIDE CRYS ER 20 MEQ PO TBCR: 20.0000 meq | EXTENDED_RELEASE_TABLET | Freq: Every day | ORAL | 3 refills | Status: DC

## 2019-09-20 NOTE — Telephone Encounter (Signed)
Results called to pt. Pt verbalized understanding. She is aware the prescription for potassium 20 mEq daily has been sent to her pharmacy. She is going to let her daughter know so she can go pick it up for the patient. Rx sent to pharmacy.

## 2019-09-22 DIAGNOSIS — Z4800 Encounter for change or removal of nonsurgical wound dressing: Secondary | ICD-10-CM | POA: Diagnosis not present

## 2019-09-22 DIAGNOSIS — I10 Essential (primary) hypertension: Secondary | ICD-10-CM | POA: Diagnosis not present

## 2019-09-22 DIAGNOSIS — Z7902 Long term (current) use of antithrombotics/antiplatelets: Secondary | ICD-10-CM | POA: Diagnosis not present

## 2019-09-22 DIAGNOSIS — I4891 Unspecified atrial fibrillation: Secondary | ICD-10-CM | POA: Diagnosis not present

## 2019-09-22 DIAGNOSIS — Z79899 Other long term (current) drug therapy: Secondary | ICD-10-CM | POA: Diagnosis not present

## 2019-09-22 DIAGNOSIS — L97819 Non-pressure chronic ulcer of other part of right lower leg with unspecified severity: Secondary | ICD-10-CM | POA: Diagnosis not present

## 2019-09-22 DIAGNOSIS — I83018 Varicose veins of right lower extremity with ulcer other part of lower leg: Secondary | ICD-10-CM | POA: Diagnosis not present

## 2019-09-25 DIAGNOSIS — I10 Essential (primary) hypertension: Secondary | ICD-10-CM | POA: Diagnosis not present

## 2019-09-25 DIAGNOSIS — Z4800 Encounter for change or removal of nonsurgical wound dressing: Secondary | ICD-10-CM | POA: Diagnosis not present

## 2019-09-25 DIAGNOSIS — Z7902 Long term (current) use of antithrombotics/antiplatelets: Secondary | ICD-10-CM | POA: Diagnosis not present

## 2019-09-25 DIAGNOSIS — I83018 Varicose veins of right lower extremity with ulcer other part of lower leg: Secondary | ICD-10-CM | POA: Diagnosis not present

## 2019-09-25 DIAGNOSIS — I4891 Unspecified atrial fibrillation: Secondary | ICD-10-CM | POA: Diagnosis not present

## 2019-09-25 DIAGNOSIS — Z79899 Other long term (current) drug therapy: Secondary | ICD-10-CM | POA: Diagnosis not present

## 2019-09-25 DIAGNOSIS — L97819 Non-pressure chronic ulcer of other part of right lower leg with unspecified severity: Secondary | ICD-10-CM | POA: Diagnosis not present

## 2019-09-27 DIAGNOSIS — L97819 Non-pressure chronic ulcer of other part of right lower leg with unspecified severity: Secondary | ICD-10-CM | POA: Diagnosis not present

## 2019-09-27 DIAGNOSIS — I83018 Varicose veins of right lower extremity with ulcer other part of lower leg: Secondary | ICD-10-CM | POA: Diagnosis not present

## 2019-09-27 DIAGNOSIS — I4891 Unspecified atrial fibrillation: Secondary | ICD-10-CM | POA: Diagnosis not present

## 2019-09-27 DIAGNOSIS — Z4800 Encounter for change or removal of nonsurgical wound dressing: Secondary | ICD-10-CM | POA: Diagnosis not present

## 2019-09-27 DIAGNOSIS — Z79899 Other long term (current) drug therapy: Secondary | ICD-10-CM | POA: Diagnosis not present

## 2019-09-27 DIAGNOSIS — I10 Essential (primary) hypertension: Secondary | ICD-10-CM | POA: Diagnosis not present

## 2019-09-27 DIAGNOSIS — Z7902 Long term (current) use of antithrombotics/antiplatelets: Secondary | ICD-10-CM | POA: Diagnosis not present

## 2019-09-29 DIAGNOSIS — L97819 Non-pressure chronic ulcer of other part of right lower leg with unspecified severity: Secondary | ICD-10-CM | POA: Diagnosis not present

## 2019-09-29 DIAGNOSIS — Z79899 Other long term (current) drug therapy: Secondary | ICD-10-CM | POA: Diagnosis not present

## 2019-09-29 DIAGNOSIS — I4891 Unspecified atrial fibrillation: Secondary | ICD-10-CM | POA: Diagnosis not present

## 2019-09-29 DIAGNOSIS — I83018 Varicose veins of right lower extremity with ulcer other part of lower leg: Secondary | ICD-10-CM | POA: Diagnosis not present

## 2019-09-29 DIAGNOSIS — Z7902 Long term (current) use of antithrombotics/antiplatelets: Secondary | ICD-10-CM | POA: Diagnosis not present

## 2019-09-29 DIAGNOSIS — I10 Essential (primary) hypertension: Secondary | ICD-10-CM | POA: Diagnosis not present

## 2019-09-29 DIAGNOSIS — Z4800 Encounter for change or removal of nonsurgical wound dressing: Secondary | ICD-10-CM | POA: Diagnosis not present

## 2019-10-02 DIAGNOSIS — Z4800 Encounter for change or removal of nonsurgical wound dressing: Secondary | ICD-10-CM | POA: Diagnosis not present

## 2019-10-02 DIAGNOSIS — I10 Essential (primary) hypertension: Secondary | ICD-10-CM | POA: Diagnosis not present

## 2019-10-02 DIAGNOSIS — L97819 Non-pressure chronic ulcer of other part of right lower leg with unspecified severity: Secondary | ICD-10-CM | POA: Diagnosis not present

## 2019-10-02 DIAGNOSIS — I83018 Varicose veins of right lower extremity with ulcer other part of lower leg: Secondary | ICD-10-CM | POA: Diagnosis not present

## 2019-10-02 DIAGNOSIS — Z7902 Long term (current) use of antithrombotics/antiplatelets: Secondary | ICD-10-CM | POA: Diagnosis not present

## 2019-10-02 DIAGNOSIS — Z79899 Other long term (current) drug therapy: Secondary | ICD-10-CM | POA: Diagnosis not present

## 2019-10-02 DIAGNOSIS — I4891 Unspecified atrial fibrillation: Secondary | ICD-10-CM | POA: Diagnosis not present

## 2019-10-02 NOTE — Progress Notes (Signed)
Follow-up Outpatient Visit Date: 10/04/2019  Primary Care Provider: [place], [place] [id number]  Chief Complaint: Follow-up leg swelling and HFpEF  HPI:  Ms. Deprimo is a 85 y.o. year-old female with history of atrial fibrillation diagnosed earlier this year, HFpEF, hypertension, prediabetes, hyperlipidemia, and chronic kidney disease stage 3, who presents for follow-up of atrial fibrillation.  I last saw her in late-October, at which time she reported feeling well with less leg swelling and gradual healing of her calf ulcers.  Ms. Patella was not interested in pursuing a rhythm control strategy.  We agreed to transition triamterene-HCTZ to furosemide for further treatment of her HFpEF with leg edema.  Metoprolol was also increased to improve blood pressure and heart rate control.  Today, Ms. Buonocore reports that she continues to feel well.  She denies chest pain, shortness of breath, palpitations, and lightheadedness.  Her leg swelling has improved a little since starting furosemide.  The right posterolateral calf wound is gradually improving, though she has a new abrasion just above the left ankle.  She continues to work with a wound care nurse, who comes to her home (next visit is in 2 days).  Ms. Bertrand remains on rivaroxaban without bleeding.  She also tolerated escalation of metoprolol at our last visit well.  --------------------------------------------------------------------------------------------------  Cardiovascular History & Procedures: Cardiovascular Problems:  Atrial fibrillation  HFpEF  Mitral, tricuspid, and aortic regurgitation  Risk Factors:  Hypertension, hyperlipidemia, prediabetes, and age > 86  Cath/PCI:  None  CV Surgery:  None  EP Procedures and Devices:  None  Non-Invasive Evaluation(s):  TTE (09/12/2019): Normal LV size.  LVEF 55-60% with moderate focal thickening of the basal septum.  Normal RV systolic function  with mild dilation.  Mild to moderate left atrial enlargement.  Mild right atrial enlargement.  Moderate mitral and severe tricuspid regurgitation.  Mild aortic regurgitation.  Elevated central venous pressure.  Mild pulmonary hypertension.  Recent CV Pertinent Labs: Lab Results  Component Value Date   K 4.1 10/04/2019   BUN 22 10/04/2019   CREATININE 1.23 (H) 10/04/2019    Past medical and surgical history were reviewed and updated in EPIC.  Current Meds  Medication Sig  . ALPRAZolam (XANAX) 1 MG tablet Take 0.5 mg by mouth 2 (two) times daily.  . cephALEXin (KEFLEX) 500 MG capsule Take 500 mg by mouth 3 (three) times daily.  . furosemide (LASIX) 40 MG tablet Take 1 tablet (40 mg total) by mouth daily.  Marland Kitchen latanoprost (XALATAN) 0.005 % ophthalmic solution Place 1 drop into both eyes at bedtime.  . metoprolol succinate (TOPROL-XL) 25 MG 24 hr tablet Take 1.5 tablets (37.5 mg total) by mouth 2 (two) times daily.  Marland Kitchen oxyCODONE (OXY IR/ROXICODONE) 5 MG immediate release tablet Take 1-2 tablets (5-10 mg total) by mouth every 4 (four) hours as needed for moderate pain.  . potassium chloride SA (KLOR-CON) 20 MEQ tablet Take 1 tablet (20 mEq total) by mouth daily.  . Rivaroxaban (XARELTO) 15 MG TABS tablet Take 1 tablet (15 mg total) by mouth daily with supper.  . [DISCONTINUED] metoprolol succinate (TOPROL-XL) 25 MG 24 hr tablet Take 1 tablet (25 mg total) by mouth 2 (two) times daily.    Allergies: Sulfa antibiotics  Social History   Tobacco Use  . Smoking status: Never Smoker  . Smokeless tobacco: Never Used  Substance Use Topics  . Alcohol use: No  . Drug use: Never    Family History  Problem Relation Age of Onset  . Stroke Mother   . Arrhythmia Son     Review of Systems: A 12-system review of systems was performed and was negative except as noted in the HPI.  --------------------------------------------------------------------------------------------------  Physical Exam:  BP 112/80 (BP Location: Left Arm, Patient Position: Sitting, Cuff Size: Normal)   Pulse 100   Ht 5\' 2"  (1.575 m)   Wt 139 lb 8 oz (63.3 kg)   SpO2 93%   BMI 25.51 kg/m   General:  NAD. HEENT: No conjunctival pallor or scleral icterus. Moist mucous membranes.  OP clear. Neck: Supple without lymphadenopathy, thyromegaly, JVD, or HJR. Lungs: Normal work of breathing. Clear to auscultation bilaterally without wheezes or crackles. Heart: Irregularly irregular with 1/6 systolic murmur. Abd: Bowel sounds present. Soft, NT/ND without hepatosplenomegaly Ext: 2+ left ankle and 1+ right ankle/calf edema.  Trace pedal pulses bilaterally. Skin: Superficial abrasion without discharge just above the left lateral malleolus.  EKG:  Atrial fibrillation (ventricular rate 91-100 bpm) with non-specific T wave changes.  Lab Results  Component Value Date   WBC 8.6 04/06/2015   HGB 13.0 04/06/2015   HCT 39.7 04/06/2015   MCV 92.7 04/06/2015   PLT 169 04/06/2015    Lab Results  Component Value Date   NA 140 10/04/2019   K 4.1 10/04/2019   CL 97 10/04/2019   CO2 26 10/04/2019   BUN 22 10/04/2019   CREATININE 1.23 (H) 10/04/2019   GLUCOSE 94 10/04/2019   ALT 17 04/06/2015    No results found for: CHOL, HDL, LDLCALC, LDLDIRECT, TRIG, CHOLHDL  --------------------------------------------------------------------------------------------------  ASSESSMENT AND PLAN: Persistent atrial fibrillation: Ms. Arman remains asymptomatic.  HR control has improved with escalation of metoprolol at our last visit, though it remains suboptimal.  We will increase metoprolol tartrate to 37.5 mg BID.  Rivaroxaban 15 mg daily should be continued.  Ms. Burges again declines cardioversion.  Leg swelling and BLE ulcers/abrasions: Right calf ulcer is gradually healing per the patient's report.  She continues to work with wound care.  She has a new abrasion above the left ankle.  Edema has improved slightly, though she  continues to have some swelling.  We will plan to increase furosemide to 40 mg BID x 1 week, followed by 40 mg daily thereafter.  I will check a BMP today to ensure stable renal function and electrolytes.  Given continued wound healing issues, we will plan to obtain ABI's when Ms. Stoen returns in 1 month.  I do not see evidence of active infection at this time.  Hypertension: BP reasonable.  We will increase furosemide x 1 week, as above.  No other medication changes.  Follow-up: Return to clinic in 1 month.  [name], MD 10/05/2019 8:45 AM

## 2019-10-04 ENCOUNTER — Other Ambulatory Visit: Payer: Self-pay

## 2019-10-04 ENCOUNTER — Encounter: Payer: Self-pay | Admitting: Internal Medicine

## 2019-10-04 ENCOUNTER — Ambulatory Visit (INDEPENDENT_AMBULATORY_CARE_PROVIDER_SITE_OTHER): Payer: Medicare Other | Admitting: Internal Medicine

## 2019-10-04 VITALS — BP 112/80 | HR 100 | Ht 62.0 in | Wt 139.5 lb

## 2019-10-04 DIAGNOSIS — M7989 Other specified soft tissue disorders: Secondary | ICD-10-CM | POA: Diagnosis not present

## 2019-10-04 DIAGNOSIS — I5033 Acute on chronic diastolic (congestive) heart failure: Secondary | ICD-10-CM | POA: Diagnosis not present

## 2019-10-04 DIAGNOSIS — S90512A Abrasion, left ankle, initial encounter: Secondary | ICD-10-CM

## 2019-10-04 DIAGNOSIS — Z4800 Encounter for change or removal of nonsurgical wound dressing: Secondary | ICD-10-CM | POA: Diagnosis not present

## 2019-10-04 DIAGNOSIS — L97211 Non-pressure chronic ulcer of right calf limited to breakdown of skin: Secondary | ICD-10-CM | POA: Diagnosis not present

## 2019-10-04 DIAGNOSIS — L97819 Non-pressure chronic ulcer of other part of right lower leg with unspecified severity: Secondary | ICD-10-CM | POA: Diagnosis not present

## 2019-10-04 DIAGNOSIS — I4819 Other persistent atrial fibrillation: Secondary | ICD-10-CM

## 2019-10-04 DIAGNOSIS — I10 Essential (primary) hypertension: Secondary | ICD-10-CM

## 2019-10-04 DIAGNOSIS — S81801D Unspecified open wound, right lower leg, subsequent encounter: Secondary | ICD-10-CM

## 2019-10-04 DIAGNOSIS — S81802D Unspecified open wound, left lower leg, subsequent encounter: Secondary | ICD-10-CM

## 2019-10-04 DIAGNOSIS — Z79899 Other long term (current) drug therapy: Secondary | ICD-10-CM | POA: Diagnosis not present

## 2019-10-04 DIAGNOSIS — I4891 Unspecified atrial fibrillation: Secondary | ICD-10-CM | POA: Diagnosis not present

## 2019-10-04 DIAGNOSIS — I83018 Varicose veins of right lower extremity with ulcer other part of lower leg: Secondary | ICD-10-CM | POA: Diagnosis not present

## 2019-10-04 DIAGNOSIS — Z7902 Long term (current) use of antithrombotics/antiplatelets: Secondary | ICD-10-CM | POA: Diagnosis not present

## 2019-10-04 MED ORDER — METOPROLOL SUCCINATE ER 25 MG PO TB24: 37.5000 mg | ORAL_TABLET | Freq: Two times a day (BID) | ORAL | 3 refills | Status: DC

## 2019-10-04 NOTE — Patient Instructions (Signed)
Medication Instructions:  Your physician has recommended you make the following change in your medication:  1- INCREASE Metoprolol to 37.5 mg (1.5 tablet) by mouth two times a day. 2- TAKE Furosemide to 40 mg by mouth two times a day for 1 week, then back to 40 mg by mouth once a day.  *If you need a refill on your cardiac medications before your next appointment, please call your pharmacy*  Lab Work: Your physician recommends that you return for lab work in: today - BMET.  If you have labs (blood work) drawn today and your tests are completely normal, you will receive your results only by: Marland Kitchen MyChart Message (if you have MyChart) OR . A paper copy in the mail If you have any lab test that is abnormal or we need to change your treatment, we will call you to review the results.  Testing/Procedures: Your physician has requested that you have an ankle brachial index (ABI) in 1 month prior to follow up appointment on same day preferably. During this test an ultrasound and blood pressure cuff are used to evaluate the arteries that supply the arms and legs with blood. Allow thirty minutes for this exam. There are no restrictions or special instructions.    Follow-Up: At [facility], you and your health needs are our priority.  As part of our continuing mission to provide you with exceptional heart care, we have created designated Provider Care Teams.  These Care Teams include your primary Cardiologist (physician) and Advanced Practice Providers (APPs -  Physician Assistants and Nurse Practitioners) who all work together to provide you with the care you need, when you need it.  Your next appointment:   1 month(s) on the same day after the ABI.   The format for your next appointment:   In Person  Provider:    You may see DR Harrell Gave END or one of the following Advanced Practice Providers on your designated Care Team:    Newmann Hodgkins, NP  Christell Faith, PA-C  Marrianne Mood,  PA-C

## 2019-10-05 ENCOUNTER — Telehealth: Payer: Self-pay

## 2019-10-05 ENCOUNTER — Encounter: Payer: Self-pay | Admitting: Internal Medicine

## 2019-10-05 LAB — BASIC METABOLIC PANEL
BUN/Creatinine Ratio: 18 (ref 12–28)
BUN: 22 mg/dL (ref 8–27)
CO2: 26 mmol/L (ref 20–29)
Calcium: 9.7 mg/dL (ref 8.7–10.3)
Chloride: 97 mmol/L (ref 96–106)
Creatinine, Ser: 1.23 mg/dL — ABNORMAL HIGH (ref 0.57–1.00)
GFR calc Af Amer: 45 mL/min/{1.73_m2} — ABNORMAL LOW (ref >59)
GFR calc non Af Amer: 39 mL/min/{1.73_m2} — ABNORMAL LOW (ref >59)
Glucose: 94 mg/dL (ref 65–99)
Potassium: 4.1 mmol/L (ref 3.5–5.2)
Sodium: 140 mmol/L (ref 134–144)

## 2019-10-05 NOTE — Telephone Encounter (Signed)
-----   Message from [name], MD sent at 10/05/2019 10:13 AM EST ----- Please let Ms. Dunker know that there has been a slight increase in her creatinine.  I still think it is okay for [place] to increase her furosemide to 40 mg twice daily x1 week.  She should try to elevate her legs when possible.  She can continue the remainder of her medications including once daily potassium supplementation.

## 2019-10-05 NOTE — Telephone Encounter (Signed)
Call to patient to discuss lab results and POC from Dr. Saunders Revel. She verbalized understanding and had no further questions at this time.   Advised pt to call for any further questions or concerns.

## 2019-10-06 DIAGNOSIS — I83018 Varicose veins of right lower extremity with ulcer other part of lower leg: Secondary | ICD-10-CM | POA: Diagnosis not present

## 2019-10-06 DIAGNOSIS — I10 Essential (primary) hypertension: Secondary | ICD-10-CM | POA: Diagnosis not present

## 2019-10-06 DIAGNOSIS — I4891 Unspecified atrial fibrillation: Secondary | ICD-10-CM | POA: Diagnosis not present

## 2019-10-06 DIAGNOSIS — Z79899 Other long term (current) drug therapy: Secondary | ICD-10-CM | POA: Diagnosis not present

## 2019-10-06 DIAGNOSIS — L97819 Non-pressure chronic ulcer of other part of right lower leg with unspecified severity: Secondary | ICD-10-CM | POA: Diagnosis not present

## 2019-10-06 DIAGNOSIS — Z4800 Encounter for change or removal of nonsurgical wound dressing: Secondary | ICD-10-CM | POA: Diagnosis not present

## 2019-10-06 DIAGNOSIS — Z7902 Long term (current) use of antithrombotics/antiplatelets: Secondary | ICD-10-CM | POA: Diagnosis not present

## 2019-10-09 DIAGNOSIS — Z4800 Encounter for change or removal of nonsurgical wound dressing: Secondary | ICD-10-CM | POA: Diagnosis not present

## 2019-10-09 DIAGNOSIS — Z79899 Other long term (current) drug therapy: Secondary | ICD-10-CM | POA: Diagnosis not present

## 2019-10-09 DIAGNOSIS — I4891 Unspecified atrial fibrillation: Secondary | ICD-10-CM | POA: Diagnosis not present

## 2019-10-09 DIAGNOSIS — I83018 Varicose veins of right lower extremity with ulcer other part of lower leg: Secondary | ICD-10-CM | POA: Diagnosis not present

## 2019-10-09 DIAGNOSIS — Z7902 Long term (current) use of antithrombotics/antiplatelets: Secondary | ICD-10-CM | POA: Diagnosis not present

## 2019-10-09 DIAGNOSIS — L97819 Non-pressure chronic ulcer of other part of right lower leg with unspecified severity: Secondary | ICD-10-CM | POA: Diagnosis not present

## 2019-10-09 DIAGNOSIS — I10 Essential (primary) hypertension: Secondary | ICD-10-CM | POA: Diagnosis not present

## 2019-10-11 DIAGNOSIS — I4891 Unspecified atrial fibrillation: Secondary | ICD-10-CM | POA: Diagnosis not present

## 2019-10-11 DIAGNOSIS — Z79899 Other long term (current) drug therapy: Secondary | ICD-10-CM | POA: Diagnosis not present

## 2019-10-11 DIAGNOSIS — Z4800 Encounter for change or removal of nonsurgical wound dressing: Secondary | ICD-10-CM | POA: Diagnosis not present

## 2019-10-11 DIAGNOSIS — I10 Essential (primary) hypertension: Secondary | ICD-10-CM | POA: Diagnosis not present

## 2019-10-11 DIAGNOSIS — Z7902 Long term (current) use of antithrombotics/antiplatelets: Secondary | ICD-10-CM | POA: Diagnosis not present

## 2019-10-11 DIAGNOSIS — L97819 Non-pressure chronic ulcer of other part of right lower leg with unspecified severity: Secondary | ICD-10-CM | POA: Diagnosis not present

## 2019-10-11 DIAGNOSIS — I83018 Varicose veins of right lower extremity with ulcer other part of lower leg: Secondary | ICD-10-CM | POA: Diagnosis not present

## 2019-10-13 DIAGNOSIS — Z4800 Encounter for change or removal of nonsurgical wound dressing: Secondary | ICD-10-CM | POA: Diagnosis not present

## 2019-10-13 DIAGNOSIS — I10 Essential (primary) hypertension: Secondary | ICD-10-CM | POA: Diagnosis not present

## 2019-10-13 DIAGNOSIS — Z7902 Long term (current) use of antithrombotics/antiplatelets: Secondary | ICD-10-CM | POA: Diagnosis not present

## 2019-10-13 DIAGNOSIS — I4891 Unspecified atrial fibrillation: Secondary | ICD-10-CM | POA: Diagnosis not present

## 2019-10-13 DIAGNOSIS — I83018 Varicose veins of right lower extremity with ulcer other part of lower leg: Secondary | ICD-10-CM | POA: Diagnosis not present

## 2019-10-13 DIAGNOSIS — Z79899 Other long term (current) drug therapy: Secondary | ICD-10-CM | POA: Diagnosis not present

## 2019-10-13 DIAGNOSIS — L97819 Non-pressure chronic ulcer of other part of right lower leg with unspecified severity: Secondary | ICD-10-CM | POA: Diagnosis not present

## 2019-10-16 DIAGNOSIS — I10 Essential (primary) hypertension: Secondary | ICD-10-CM | POA: Diagnosis not present

## 2019-10-16 DIAGNOSIS — Z79899 Other long term (current) drug therapy: Secondary | ICD-10-CM | POA: Diagnosis not present

## 2019-10-16 DIAGNOSIS — L97819 Non-pressure chronic ulcer of other part of right lower leg with unspecified severity: Secondary | ICD-10-CM | POA: Diagnosis not present

## 2019-10-16 DIAGNOSIS — I83018 Varicose veins of right lower extremity with ulcer other part of lower leg: Secondary | ICD-10-CM | POA: Diagnosis not present

## 2019-10-16 DIAGNOSIS — Z4800 Encounter for change or removal of nonsurgical wound dressing: Secondary | ICD-10-CM | POA: Diagnosis not present

## 2019-10-16 DIAGNOSIS — I4891 Unspecified atrial fibrillation: Secondary | ICD-10-CM | POA: Diagnosis not present

## 2019-10-16 DIAGNOSIS — Z7902 Long term (current) use of antithrombotics/antiplatelets: Secondary | ICD-10-CM | POA: Diagnosis not present

## 2019-10-18 DIAGNOSIS — L97819 Non-pressure chronic ulcer of other part of right lower leg with unspecified severity: Secondary | ICD-10-CM | POA: Diagnosis not present

## 2019-10-18 DIAGNOSIS — I10 Essential (primary) hypertension: Secondary | ICD-10-CM | POA: Diagnosis not present

## 2019-10-18 DIAGNOSIS — Z7902 Long term (current) use of antithrombotics/antiplatelets: Secondary | ICD-10-CM | POA: Diagnosis not present

## 2019-10-18 DIAGNOSIS — I83018 Varicose veins of right lower extremity with ulcer other part of lower leg: Secondary | ICD-10-CM | POA: Diagnosis not present

## 2019-10-18 DIAGNOSIS — Z79899 Other long term (current) drug therapy: Secondary | ICD-10-CM | POA: Diagnosis not present

## 2019-10-18 DIAGNOSIS — Z4800 Encounter for change or removal of nonsurgical wound dressing: Secondary | ICD-10-CM | POA: Diagnosis not present

## 2019-10-18 DIAGNOSIS — I4891 Unspecified atrial fibrillation: Secondary | ICD-10-CM | POA: Diagnosis not present

## 2019-10-19 ENCOUNTER — Other Ambulatory Visit: Payer: Self-pay | Admitting: Internal Medicine

## 2019-10-19 DIAGNOSIS — S81802D Unspecified open wound, left lower leg, subsequent encounter: Secondary | ICD-10-CM

## 2019-10-19 DIAGNOSIS — S81801D Unspecified open wound, right lower leg, subsequent encounter: Secondary | ICD-10-CM

## 2019-10-20 DIAGNOSIS — Z7902 Long term (current) use of antithrombotics/antiplatelets: Secondary | ICD-10-CM | POA: Diagnosis not present

## 2019-10-20 DIAGNOSIS — I4891 Unspecified atrial fibrillation: Secondary | ICD-10-CM | POA: Diagnosis not present

## 2019-10-20 DIAGNOSIS — Z79899 Other long term (current) drug therapy: Secondary | ICD-10-CM | POA: Diagnosis not present

## 2019-10-20 DIAGNOSIS — L97819 Non-pressure chronic ulcer of other part of right lower leg with unspecified severity: Secondary | ICD-10-CM | POA: Diagnosis not present

## 2019-10-20 DIAGNOSIS — I83018 Varicose veins of right lower extremity with ulcer other part of lower leg: Secondary | ICD-10-CM | POA: Diagnosis not present

## 2019-10-20 DIAGNOSIS — Z4800 Encounter for change or removal of nonsurgical wound dressing: Secondary | ICD-10-CM | POA: Diagnosis not present

## 2019-10-20 DIAGNOSIS — I10 Essential (primary) hypertension: Secondary | ICD-10-CM | POA: Diagnosis not present

## 2019-10-23 DIAGNOSIS — Z79899 Other long term (current) drug therapy: Secondary | ICD-10-CM | POA: Diagnosis not present

## 2019-10-23 DIAGNOSIS — L97819 Non-pressure chronic ulcer of other part of right lower leg with unspecified severity: Secondary | ICD-10-CM | POA: Diagnosis not present

## 2019-10-23 DIAGNOSIS — Z7902 Long term (current) use of antithrombotics/antiplatelets: Secondary | ICD-10-CM | POA: Diagnosis not present

## 2019-10-23 DIAGNOSIS — I10 Essential (primary) hypertension: Secondary | ICD-10-CM | POA: Diagnosis not present

## 2019-10-23 DIAGNOSIS — Z4800 Encounter for change or removal of nonsurgical wound dressing: Secondary | ICD-10-CM | POA: Diagnosis not present

## 2019-10-23 DIAGNOSIS — I83018 Varicose veins of right lower extremity with ulcer other part of lower leg: Secondary | ICD-10-CM | POA: Diagnosis not present

## 2019-10-23 DIAGNOSIS — I4891 Unspecified atrial fibrillation: Secondary | ICD-10-CM | POA: Diagnosis not present

## 2019-10-25 DIAGNOSIS — Z7902 Long term (current) use of antithrombotics/antiplatelets: Secondary | ICD-10-CM | POA: Diagnosis not present

## 2019-10-25 DIAGNOSIS — I4891 Unspecified atrial fibrillation: Secondary | ICD-10-CM | POA: Diagnosis not present

## 2019-10-25 DIAGNOSIS — I10 Essential (primary) hypertension: Secondary | ICD-10-CM | POA: Diagnosis not present

## 2019-10-25 DIAGNOSIS — Z4800 Encounter for change or removal of nonsurgical wound dressing: Secondary | ICD-10-CM | POA: Diagnosis not present

## 2019-10-25 DIAGNOSIS — I83018 Varicose veins of right lower extremity with ulcer other part of lower leg: Secondary | ICD-10-CM | POA: Diagnosis not present

## 2019-10-25 DIAGNOSIS — Z79899 Other long term (current) drug therapy: Secondary | ICD-10-CM | POA: Diagnosis not present

## 2019-10-25 DIAGNOSIS — L97819 Non-pressure chronic ulcer of other part of right lower leg with unspecified severity: Secondary | ICD-10-CM | POA: Diagnosis not present

## 2019-10-27 DIAGNOSIS — L97819 Non-pressure chronic ulcer of other part of right lower leg with unspecified severity: Secondary | ICD-10-CM | POA: Diagnosis not present

## 2019-10-27 DIAGNOSIS — I83018 Varicose veins of right lower extremity with ulcer other part of lower leg: Secondary | ICD-10-CM | POA: Diagnosis not present

## 2019-10-27 DIAGNOSIS — Z7902 Long term (current) use of antithrombotics/antiplatelets: Secondary | ICD-10-CM | POA: Diagnosis not present

## 2019-10-27 DIAGNOSIS — I4891 Unspecified atrial fibrillation: Secondary | ICD-10-CM | POA: Diagnosis not present

## 2019-10-27 DIAGNOSIS — I10 Essential (primary) hypertension: Secondary | ICD-10-CM | POA: Diagnosis not present

## 2019-10-27 DIAGNOSIS — Z79899 Other long term (current) drug therapy: Secondary | ICD-10-CM | POA: Diagnosis not present

## 2019-10-27 DIAGNOSIS — Z4800 Encounter for change or removal of nonsurgical wound dressing: Secondary | ICD-10-CM | POA: Diagnosis not present

## 2019-10-30 DIAGNOSIS — Z79899 Other long term (current) drug therapy: Secondary | ICD-10-CM | POA: Diagnosis not present

## 2019-10-30 DIAGNOSIS — I83018 Varicose veins of right lower extremity with ulcer other part of lower leg: Secondary | ICD-10-CM | POA: Diagnosis not present

## 2019-10-30 DIAGNOSIS — Z7902 Long term (current) use of antithrombotics/antiplatelets: Secondary | ICD-10-CM | POA: Diagnosis not present

## 2019-10-30 DIAGNOSIS — I4891 Unspecified atrial fibrillation: Secondary | ICD-10-CM | POA: Diagnosis not present

## 2019-10-30 DIAGNOSIS — Z4800 Encounter for change or removal of nonsurgical wound dressing: Secondary | ICD-10-CM | POA: Diagnosis not present

## 2019-10-30 DIAGNOSIS — L97819 Non-pressure chronic ulcer of other part of right lower leg with unspecified severity: Secondary | ICD-10-CM | POA: Diagnosis not present

## 2019-10-30 DIAGNOSIS — I10 Essential (primary) hypertension: Secondary | ICD-10-CM | POA: Diagnosis not present

## 2019-11-01 DIAGNOSIS — L97819 Non-pressure chronic ulcer of other part of right lower leg with unspecified severity: Secondary | ICD-10-CM | POA: Diagnosis not present

## 2019-11-01 DIAGNOSIS — Z4800 Encounter for change or removal of nonsurgical wound dressing: Secondary | ICD-10-CM | POA: Diagnosis not present

## 2019-11-01 DIAGNOSIS — Z7902 Long term (current) use of antithrombotics/antiplatelets: Secondary | ICD-10-CM | POA: Diagnosis not present

## 2019-11-01 DIAGNOSIS — I4891 Unspecified atrial fibrillation: Secondary | ICD-10-CM | POA: Diagnosis not present

## 2019-11-01 DIAGNOSIS — Z79899 Other long term (current) drug therapy: Secondary | ICD-10-CM | POA: Diagnosis not present

## 2019-11-01 DIAGNOSIS — I10 Essential (primary) hypertension: Secondary | ICD-10-CM | POA: Diagnosis not present

## 2019-11-01 DIAGNOSIS — I83018 Varicose veins of right lower extremity with ulcer other part of lower leg: Secondary | ICD-10-CM | POA: Diagnosis not present

## 2019-11-03 ENCOUNTER — Other Ambulatory Visit: Payer: Self-pay | Admitting: Internal Medicine

## 2019-11-03 ENCOUNTER — Ambulatory Visit (INDEPENDENT_AMBULATORY_CARE_PROVIDER_SITE_OTHER): Payer: Medicare Other

## 2019-11-03 ENCOUNTER — Ambulatory Visit: Payer: Medicare Other | Admitting: Nurse Practitioner

## 2019-11-03 ENCOUNTER — Other Ambulatory Visit: Payer: Self-pay

## 2019-11-03 ENCOUNTER — Telehealth: Payer: Self-pay | Admitting: Internal Medicine

## 2019-11-03 DIAGNOSIS — Z4800 Encounter for change or removal of nonsurgical wound dressing: Secondary | ICD-10-CM | POA: Diagnosis not present

## 2019-11-03 DIAGNOSIS — S81802D Unspecified open wound, left lower leg, subsequent encounter: Secondary | ICD-10-CM

## 2019-11-03 DIAGNOSIS — I83018 Varicose veins of right lower extremity with ulcer other part of lower leg: Secondary | ICD-10-CM | POA: Diagnosis not present

## 2019-11-03 DIAGNOSIS — Z7902 Long term (current) use of antithrombotics/antiplatelets: Secondary | ICD-10-CM | POA: Diagnosis not present

## 2019-11-03 DIAGNOSIS — I4891 Unspecified atrial fibrillation: Secondary | ICD-10-CM | POA: Diagnosis not present

## 2019-11-03 DIAGNOSIS — Z79899 Other long term (current) drug therapy: Secondary | ICD-10-CM | POA: Diagnosis not present

## 2019-11-03 DIAGNOSIS — S81801D Unspecified open wound, right lower leg, subsequent encounter: Secondary | ICD-10-CM | POA: Diagnosis not present

## 2019-11-03 DIAGNOSIS — I10 Essential (primary) hypertension: Secondary | ICD-10-CM | POA: Diagnosis not present

## 2019-11-03 DIAGNOSIS — L97819 Non-pressure chronic ulcer of other part of right lower leg with unspecified severity: Secondary | ICD-10-CM | POA: Diagnosis not present

## 2019-11-03 NOTE — Telephone Encounter (Signed)
Thanks!    Chris.

## 2019-11-03 NOTE — Telephone Encounter (Signed)
I discussed the results of today's lower extremity arterial studies with Kristen Graham.  She continues to have a wound on her right calf but states that it is improving with treatment from a wound care nurse.  I recommended that we proceed with abdominal aortogram and bilateral lower extremity angiography and possible intervention, given that slowly healing wound and abnormality seen on today's duplex study are concerning for critical limb ischemia.  However, Kristen Graham wishes to think about this and discuss it with her family.  She is scheduled for follow-up in our office next Tuesday, at which time this will need to be readdressed.  I advised her to notify us immediately or seek further medical attention where she to have worsening of her wound or develop pain in either leg.  [name], MD [facility]

## 2019-11-06 DIAGNOSIS — L97819 Non-pressure chronic ulcer of other part of right lower leg with unspecified severity: Secondary | ICD-10-CM | POA: Diagnosis not present

## 2019-11-06 DIAGNOSIS — I10 Essential (primary) hypertension: Secondary | ICD-10-CM | POA: Diagnosis not present

## 2019-11-06 DIAGNOSIS — I4891 Unspecified atrial fibrillation: Secondary | ICD-10-CM | POA: Diagnosis not present

## 2019-11-06 DIAGNOSIS — I83018 Varicose veins of right lower extremity with ulcer other part of lower leg: Secondary | ICD-10-CM | POA: Diagnosis not present

## 2019-11-06 DIAGNOSIS — Z4800 Encounter for change or removal of nonsurgical wound dressing: Secondary | ICD-10-CM | POA: Diagnosis not present

## 2019-11-06 DIAGNOSIS — Z7902 Long term (current) use of antithrombotics/antiplatelets: Secondary | ICD-10-CM | POA: Diagnosis not present

## 2019-11-06 DIAGNOSIS — Z79899 Other long term (current) drug therapy: Secondary | ICD-10-CM | POA: Diagnosis not present

## 2019-11-07 ENCOUNTER — Other Ambulatory Visit
Admission: RE | Admit: 2019-11-07 | Discharge: 2019-11-07 | Disposition: A | Discharge status: Home / Self Care | Payer: Medicare Other | Attending: Family | Admitting: Family

## 2019-11-07 ENCOUNTER — Encounter: Payer: Self-pay | Admitting: Family

## 2019-11-07 ENCOUNTER — Other Ambulatory Visit: Payer: Self-pay

## 2019-11-07 ENCOUNTER — Ambulatory Visit (INDEPENDENT_AMBULATORY_CARE_PROVIDER_SITE_OTHER): Payer: Medicare Other | Admitting: Family

## 2019-11-07 VITALS — BP 122/75 | HR 86 | Ht 64.0 in | Wt 132.5 lb

## 2019-11-07 DIAGNOSIS — I10 Essential (primary) hypertension: Secondary | ICD-10-CM

## 2019-11-07 DIAGNOSIS — M7989 Other specified soft tissue disorders: Secondary | ICD-10-CM | POA: Diagnosis not present

## 2019-11-07 DIAGNOSIS — Z79899 Other long term (current) drug therapy: Secondary | ICD-10-CM | POA: Diagnosis not present

## 2019-11-07 DIAGNOSIS — I4819 Other persistent atrial fibrillation: Secondary | ICD-10-CM | POA: Diagnosis not present

## 2019-11-07 DIAGNOSIS — I5033 Acute on chronic diastolic (congestive) heart failure: Secondary | ICD-10-CM | POA: Diagnosis not present

## 2019-11-07 DIAGNOSIS — I110 Hypertensive heart disease with heart failure: Secondary | ICD-10-CM | POA: Diagnosis not present

## 2019-11-07 LAB — BASIC METABOLIC PANEL
Anion gap: 11 (ref 5–15)
BUN: 23 mg/dL (ref 8–23)
CO2: 31 mmol/L (ref 22–32)
Calcium: 9.5 mg/dL (ref 8.9–10.3)
Chloride: 95 mmol/L — ABNORMAL LOW (ref 98–111)
Creatinine, Ser: 1.1 mg/dL — ABNORMAL HIGH (ref 0.44–1.00)
GFR calc Af Amer: 52 mL/min — ABNORMAL LOW (ref >60)
GFR calc non Af Amer: 45 mL/min — ABNORMAL LOW (ref >60)
Glucose, Bld: 107 mg/dL — ABNORMAL HIGH (ref 70–99)
Potassium: 4 mmol/L (ref 3.5–5.1)
Sodium: 137 mmol/L (ref 135–145)

## 2019-11-07 LAB — LIPID PANEL
Cholesterol: 132 mg/dL (ref 0–200)
HDL: 42 mg/dL (ref >40)
LDL Cholesterol: 76 mg/dL (ref 0–99)
Total CHOL/HDL Ratio: 3.1 RATIO
Triglycerides: 71 mg/dL (ref <150)
VLDL: 14 mg/dL (ref 0–40)

## 2019-11-07 NOTE — Patient Instructions (Addendum)
  Medication Instructions:   Your physician has recommended you make the following change in your medication:   1) Increase Lasix (Furosemide) 40MG  to 1 tablet by mouth twice a day for 3 days, then go back to 1 tablet by mouth once a day.  *If you need a refill on your cardiac medications before your next appointment, please call your pharmacy*  Lab Work:  You will have labs drawn today: BMET and lipid panel   If you have labs (blood work) drawn today and your tests are completely normal, you will receive your results only by: Marland Kitchen MyChart Message (if you have MyChart) OR . A paper copy in the mail If you have any lab test that is abnormal or we need to change your treatment, we will call you to review the results.  Testing/Procedures:  None ordered today  Follow-Up: At [facility], you and your health needs are our priority.  As part of our continuing mission to provide you with exceptional heart care, we have created designated Provider Care Teams.  These Care Teams include your primary Cardiologist (physician) and Advanced Practice Providers (APPs -  Physician Assistants and Nurse Practitioners) who all work together to provide you with the care you need, when you need it.  Your next appointment:   6 week(s)  The format for your next appointment:   In Person  Provider:    You may see [name], MD or one of the following Advanced Practice Providers on your designated Care Team:    Topp Hodgkins, NP  Christell Faith, PA-C  Marrianne Mood, PA-C

## 2019-11-07 NOTE — Progress Notes (Signed)
Office Visit    Patient Name: Kristen Graham Date of Encounter: 11/07/2019  Primary Care Provider:  Philmore Pali, NP Primary Cardiologist:  [name], MD Electrophysiologist:  None   Chief Complaint    Kristen Graham is a 85 y.o. female with a hx of atrial fibrillation on chronic anticoagulation, HFpEF, HTN, prediabetes, HLD, CKD 3, PAD presents today for follow up after ABIs.   Past Medical History    Past Medical History:  Diagnosis Date  . Atrial fibrillation ([place]) 2020  . Glaucoma   . Hyperlipidemia   . Hypertension   . Left wrist fracture   . Prediabetes    Past Surgical History:  Procedure Laterality Date  . CATARACT EXTRACTION    . I & D EXTREMITY Left 04/06/2015   Procedure: IRRIGATION AND DEBRIDEMENT left forearm;  Surgeon: Hessie Knows, MD;  Location: [facility] ORS;  Service: Orthopedics;  Laterality: Left;  . I&D leg    . ORIF RADIAL FRACTURE Left 04/06/2015   Procedure: OPEN REDUCTION INTERNAL FIXATION (ORIF) RADIAL FRACTURE;  Surgeon: Hessie Knows, MD;  Location: [facility] ORS;  Service: Orthopedics;  Laterality: Left;    Allergies  Allergies  Allergen Reactions  . Other Nausea Only  . Sulfa Antibiotics Nausea Only    History of Present Illness    Kristen Graham is a 85 y.o. female with a hx of  atrial fibrillation on chronic anticoagulation, HFpEF, HTN, prediabetes, HLD, CKD 3, PAD last seen 10/04/2019 by Dr. Saunders Revel.  Reports today for follow-up after recent ABIs.  She was diagnosed with atrial fibrillation earlier this year.  She has not been interested in pursuing a rhythm control strategy.  She has tolerated anticoagulation well.  Her metoprolol has been increased to improve blood pressure control and heart rate control.  She was recommended for ABIs after she was noted to have poor wound healing to an injury on her right calf.  She has been followed by wound care for approximately 2 years.  The wound nurse comes to the home twice per week.  Vascular  duplex/ABIs 11/28/19 right lower extremity 50 to 74% stenosis noted in popliteal artery, mild calcified arterial walls in the thigh arteries, moderately calcified walls in the calf arteries with possible occlusion versus high-grade stenosis.  Left lower extremity with no evidence of stenosis, minimal plaque and common femoral and moderately calcified vessels and calf arteries. Dr. Saunders Revel recommended she proceed with an abdominal aortogram and bilateral lower extremity angiography for possible intervention.  Concern for critical limb ischemia on the above-mentioned duplex.   Present today with her son.  She reports no chest pain, pressure, tightness.  Reports no SLB nor DOE.  She is unaware for atrial fibrillation with no palpitations.  She reports no pain in her legs even when walking.  Enjoy spending time with her grandchildren.  We again reviewed the results of her vascular duplex/ABIs.  She was recommended abdominal aortogram and peripheral angiography.  She again tells me she will "have to think about it ".  Her son was present at time of visit and emphasized with him the importance of this testing.  Reviewed that if she had new leg pain, significant changes to her wound that she needed to seek emergency services.  EKGs/Labs/Other Studies Reviewed:   The following studies were reviewed today: ABI 2019/11/28 Summary: Right: Low end range 50-74% stenosis noted in the popiteal artery. Mild calcified arterial walls in the thigh arteries, moderate calcified walls in the calf  arteries with possible occlusion vs. high grade stenosis in the mid to distal posterior tibial  artery.   Left: No evidence of stenosis seen. There is minimal plaque seen in the common femoral artery and moderate calcfied vessels seen in the calf arteries.  EKG:  EKG is ordered today.  The ekg ordered today demonstrates atrial fibrillation rate 86 bpm.   Recent Labs: 11/07/2019: BUN 23; Creatinine, Ser 1.10; Potassium 4.0; Sodium  137  Recent Lipid Panel    Component Value Date/Time   CHOL 132 11/07/2019 1055   TRIG 71 11/07/2019 1055   HDL 42 11/07/2019 1055   CHOLHDL 3.1 11/07/2019 1055   VLDL 14 11/07/2019 1055   LDLCALC 76 11/07/2019 1055    Home Medications   Current Meds  Medication Sig  . ALPRAZolam (XANAX) 1 MG tablet Take 0.5 mg by mouth 2 (two) times daily.  . furosemide (LASIX) 40 MG tablet Take 1 tablet (40 mg total) by mouth daily.  Marland Kitchen latanoprost (XALATAN) 0.005 % ophthalmic solution Place 1 drop into both eyes at bedtime.  . metoprolol succinate (TOPROL-XL) 25 MG 24 hr tablet Take 1.5 tablets (37.5 mg total) by mouth 2 (two) times daily.  . potassium chloride SA (KLOR-CON) 20 MEQ tablet Take 1 tablet (20 mEq total) by mouth daily.  . Rivaroxaban (XARELTO) 15 MG TABS tablet Take 1 tablet (15 mg total) by mouth daily with supper.      Review of Systems    Review of Systems  Constitution: Negative for chills, fever and malaise/fatigue.  Cardiovascular: Positive for leg swelling. Negative for chest pain, dyspnea on exertion, near-syncope, orthopnea, palpitations and syncope.  Respiratory: Negative for cough, shortness of breath and wheezing.   Skin: Positive for poor wound healing.  Gastrointestinal: Negative for melena, nausea and vomiting.  Genitourinary: Negative for hematuria.  Neurological: Negative for dizziness, light-headedness and weakness.   All other systems reviewed and are otherwise negative except as noted above.  Physical Exam    VS:  BP 122/75 (BP Location: Right Arm, Patient Position: Sitting, Cuff Size: Normal)   Pulse 86   Ht 5\' 4"  (1.626 m)   Wt 132 lb 8 oz (60.1 kg)   SpO2 95%   BMI 22.74 kg/m  , BMI Body mass index is 22.74 kg/m. GEN: Well nourished, well developed, in no acute distress. HEENT: normal. Neck: Supple, no JVD, carotid bruits, or masses. Cardiac: irregularly irregular, no murmurs, rubs, or gallops. No clubbing, cyanosis,.  Radials 2+and equal  bilaterally. PT trace and equal bilaterally. Bilateral LE 2+ pitting edema.  Respiratory:  Respirations regular and unlabored, clear to auscultation bilaterally. GI: Soft, nontender, nondistended, BS + x 4. MS: No deformity or atrophy. Skin: Warm and dry, no rash. Multiple abrasion noted to bilateral LE. Bandage noted to R calf and L calf, both clean, dry, intact.  Neuro:  Strength and sensation are intact. Psych: Normal affect.  Accessory Clinical Findings    ECG personally reviewed by me today - rate controlled atrial fibrillation 86 bpm - no acute changes.  Assessment & Plan    1. PAD - ABI 11/03/19 shows significant arterial narrowing below popliteal. RLE 50-74% stenosis to popliteal artery. Recommended for abdominal aortogram and bilateral LE angiography with possible intervention. She again politely declines and tells me she will "think about it". She was instructed to seek immediate medical attention if her wound worsens or she has pain in either leg.   2. Persistent atrial fibrillation -EKG today rate controlled atrial fibrillation.  She has declined cardioversion in the past.  Continue present metoprolol dose.  She is asymptomatic with no palpitations, shortness of breath.  Appropriately anticoagulated on Xarelto.  3. Long-term current use of anticoagulation -secondary to persistent atrial fibrillation.  Denies bleeding complications.  Continue Xarelto.  4. HFpEF - Reports no SOB nor DOE.  2+ pitting edema to bilateral lower extremities.  Increase Lasix to 40 mg twice daily x3 days and return to 40 mg daily.  BM today.  5. Bilateral lower extremity edema and BLE ulcer/abrasions -wound care nurse comes around twice per week.  Tells me her edema is about at her baseline, it did improve after her last office visit when Dr. And prescribed Lasix 40 twice daily for 1 week and then returned to 40 mg daily. Will increase Lasix for short term, as above, as bilateral LE with 2+ pitting edema. No  signs of infection on exam today. She does have a small "water blister" on her LLE that is covered with a bandage and healing well per her report.   6. HTN -BP well controlled today.  Continue present antihypertensive regimen.  7. HLD - Noted previous history.  Not presently on lipid-lowering therapy.  In setting of PAD will check lipid profile today.  Disposition: Follow up in 6 week(s) with Dr. Saunders Revel or APP.    Loel Dubonnet, NP 11/07/2019, 1:20 PM

## 2019-11-08 DIAGNOSIS — Z79899 Other long term (current) drug therapy: Secondary | ICD-10-CM | POA: Diagnosis not present

## 2019-11-08 DIAGNOSIS — Z7902 Long term (current) use of antithrombotics/antiplatelets: Secondary | ICD-10-CM | POA: Diagnosis not present

## 2019-11-08 DIAGNOSIS — I83018 Varicose veins of right lower extremity with ulcer other part of lower leg: Secondary | ICD-10-CM | POA: Diagnosis not present

## 2019-11-08 DIAGNOSIS — I10 Essential (primary) hypertension: Secondary | ICD-10-CM | POA: Diagnosis not present

## 2019-11-08 DIAGNOSIS — I4891 Unspecified atrial fibrillation: Secondary | ICD-10-CM | POA: Diagnosis not present

## 2019-11-08 DIAGNOSIS — Z4800 Encounter for change or removal of nonsurgical wound dressing: Secondary | ICD-10-CM | POA: Diagnosis not present

## 2019-11-08 DIAGNOSIS — L97819 Non-pressure chronic ulcer of other part of right lower leg with unspecified severity: Secondary | ICD-10-CM | POA: Diagnosis not present

## 2019-11-13 DIAGNOSIS — L97819 Non-pressure chronic ulcer of other part of right lower leg with unspecified severity: Secondary | ICD-10-CM | POA: Diagnosis not present

## 2019-11-13 DIAGNOSIS — Z7902 Long term (current) use of antithrombotics/antiplatelets: Secondary | ICD-10-CM | POA: Diagnosis not present

## 2019-11-13 DIAGNOSIS — Z4800 Encounter for change or removal of nonsurgical wound dressing: Secondary | ICD-10-CM | POA: Diagnosis not present

## 2019-11-13 DIAGNOSIS — I10 Essential (primary) hypertension: Secondary | ICD-10-CM | POA: Diagnosis not present

## 2019-11-13 DIAGNOSIS — Z79899 Other long term (current) drug therapy: Secondary | ICD-10-CM | POA: Diagnosis not present

## 2019-11-13 DIAGNOSIS — I83018 Varicose veins of right lower extremity with ulcer other part of lower leg: Secondary | ICD-10-CM | POA: Diagnosis not present

## 2019-11-13 DIAGNOSIS — I4891 Unspecified atrial fibrillation: Secondary | ICD-10-CM | POA: Diagnosis not present

## 2019-11-15 DIAGNOSIS — Z79899 Other long term (current) drug therapy: Secondary | ICD-10-CM | POA: Diagnosis not present

## 2019-11-15 DIAGNOSIS — Z7902 Long term (current) use of antithrombotics/antiplatelets: Secondary | ICD-10-CM | POA: Diagnosis not present

## 2019-11-15 DIAGNOSIS — I4891 Unspecified atrial fibrillation: Secondary | ICD-10-CM | POA: Diagnosis not present

## 2019-11-15 DIAGNOSIS — L97819 Non-pressure chronic ulcer of other part of right lower leg with unspecified severity: Secondary | ICD-10-CM | POA: Diagnosis not present

## 2019-11-15 DIAGNOSIS — I10 Essential (primary) hypertension: Secondary | ICD-10-CM | POA: Diagnosis not present

## 2019-11-15 DIAGNOSIS — I83018 Varicose veins of right lower extremity with ulcer other part of lower leg: Secondary | ICD-10-CM | POA: Diagnosis not present

## 2019-11-15 DIAGNOSIS — Z4800 Encounter for change or removal of nonsurgical wound dressing: Secondary | ICD-10-CM | POA: Diagnosis not present

## 2019-11-20 DIAGNOSIS — Z79899 Other long term (current) drug therapy: Secondary | ICD-10-CM | POA: Diagnosis not present

## 2019-11-20 DIAGNOSIS — L97819 Non-pressure chronic ulcer of other part of right lower leg with unspecified severity: Secondary | ICD-10-CM | POA: Diagnosis not present

## 2019-11-20 DIAGNOSIS — I10 Essential (primary) hypertension: Secondary | ICD-10-CM | POA: Diagnosis not present

## 2019-11-20 DIAGNOSIS — Z4800 Encounter for change or removal of nonsurgical wound dressing: Secondary | ICD-10-CM | POA: Diagnosis not present

## 2019-11-20 DIAGNOSIS — I83018 Varicose veins of right lower extremity with ulcer other part of lower leg: Secondary | ICD-10-CM | POA: Diagnosis not present

## 2019-11-20 DIAGNOSIS — I4891 Unspecified atrial fibrillation: Secondary | ICD-10-CM | POA: Diagnosis not present

## 2019-11-20 DIAGNOSIS — Z7902 Long term (current) use of antithrombotics/antiplatelets: Secondary | ICD-10-CM | POA: Diagnosis not present

## 2019-11-22 DIAGNOSIS — Z79899 Other long term (current) drug therapy: Secondary | ICD-10-CM | POA: Diagnosis not present

## 2019-11-22 DIAGNOSIS — Z4800 Encounter for change or removal of nonsurgical wound dressing: Secondary | ICD-10-CM | POA: Diagnosis not present

## 2019-11-22 DIAGNOSIS — I4891 Unspecified atrial fibrillation: Secondary | ICD-10-CM | POA: Diagnosis not present

## 2019-11-22 DIAGNOSIS — Z7902 Long term (current) use of antithrombotics/antiplatelets: Secondary | ICD-10-CM | POA: Diagnosis not present

## 2019-11-22 DIAGNOSIS — L97819 Non-pressure chronic ulcer of other part of right lower leg with unspecified severity: Secondary | ICD-10-CM | POA: Diagnosis not present

## 2019-11-22 DIAGNOSIS — I10 Essential (primary) hypertension: Secondary | ICD-10-CM | POA: Diagnosis not present

## 2019-11-22 DIAGNOSIS — I83018 Varicose veins of right lower extremity with ulcer other part of lower leg: Secondary | ICD-10-CM | POA: Diagnosis not present

## 2019-11-24 DIAGNOSIS — Z79899 Other long term (current) drug therapy: Secondary | ICD-10-CM | POA: Diagnosis not present

## 2019-11-24 DIAGNOSIS — I10 Essential (primary) hypertension: Secondary | ICD-10-CM | POA: Diagnosis not present

## 2019-11-24 DIAGNOSIS — Z7902 Long term (current) use of antithrombotics/antiplatelets: Secondary | ICD-10-CM | POA: Diagnosis not present

## 2019-11-24 DIAGNOSIS — I4891 Unspecified atrial fibrillation: Secondary | ICD-10-CM | POA: Diagnosis not present

## 2019-11-24 DIAGNOSIS — I83018 Varicose veins of right lower extremity with ulcer other part of lower leg: Secondary | ICD-10-CM | POA: Diagnosis not present

## 2019-11-24 DIAGNOSIS — Z4800 Encounter for change or removal of nonsurgical wound dressing: Secondary | ICD-10-CM | POA: Diagnosis not present

## 2019-11-24 DIAGNOSIS — L97819 Non-pressure chronic ulcer of other part of right lower leg with unspecified severity: Secondary | ICD-10-CM | POA: Diagnosis not present

## 2019-11-27 DIAGNOSIS — I4891 Unspecified atrial fibrillation: Secondary | ICD-10-CM | POA: Diagnosis not present

## 2019-11-27 DIAGNOSIS — I10 Essential (primary) hypertension: Secondary | ICD-10-CM | POA: Diagnosis not present

## 2019-11-27 DIAGNOSIS — L97819 Non-pressure chronic ulcer of other part of right lower leg with unspecified severity: Secondary | ICD-10-CM | POA: Diagnosis not present

## 2019-11-27 DIAGNOSIS — Z7902 Long term (current) use of antithrombotics/antiplatelets: Secondary | ICD-10-CM | POA: Diagnosis not present

## 2019-11-27 DIAGNOSIS — I83018 Varicose veins of right lower extremity with ulcer other part of lower leg: Secondary | ICD-10-CM | POA: Diagnosis not present

## 2019-11-27 DIAGNOSIS — Z4800 Encounter for change or removal of nonsurgical wound dressing: Secondary | ICD-10-CM | POA: Diagnosis not present

## 2019-11-27 DIAGNOSIS — Z79899 Other long term (current) drug therapy: Secondary | ICD-10-CM | POA: Diagnosis not present

## 2019-11-27 DIAGNOSIS — Z20822 Contact with and (suspected) exposure to covid-19: Secondary | ICD-10-CM | POA: Diagnosis not present

## 2019-11-29 DIAGNOSIS — I10 Essential (primary) hypertension: Secondary | ICD-10-CM | POA: Diagnosis not present

## 2019-11-29 DIAGNOSIS — Z7902 Long term (current) use of antithrombotics/antiplatelets: Secondary | ICD-10-CM | POA: Diagnosis not present

## 2019-11-29 DIAGNOSIS — L97819 Non-pressure chronic ulcer of other part of right lower leg with unspecified severity: Secondary | ICD-10-CM | POA: Diagnosis not present

## 2019-11-29 DIAGNOSIS — Z79899 Other long term (current) drug therapy: Secondary | ICD-10-CM | POA: Diagnosis not present

## 2019-11-29 DIAGNOSIS — I83018 Varicose veins of right lower extremity with ulcer other part of lower leg: Secondary | ICD-10-CM | POA: Diagnosis not present

## 2019-11-29 DIAGNOSIS — I4891 Unspecified atrial fibrillation: Secondary | ICD-10-CM | POA: Diagnosis not present

## 2019-11-29 DIAGNOSIS — Z4800 Encounter for change or removal of nonsurgical wound dressing: Secondary | ICD-10-CM | POA: Diagnosis not present

## 2019-12-01 DIAGNOSIS — R609 Edema, unspecified: Secondary | ICD-10-CM | POA: Diagnosis not present

## 2019-12-01 DIAGNOSIS — I10 Essential (primary) hypertension: Secondary | ICD-10-CM | POA: Diagnosis not present

## 2019-12-01 DIAGNOSIS — L97819 Non-pressure chronic ulcer of other part of right lower leg with unspecified severity: Secondary | ICD-10-CM | POA: Diagnosis not present

## 2019-12-01 DIAGNOSIS — Z7902 Long term (current) use of antithrombotics/antiplatelets: Secondary | ICD-10-CM | POA: Diagnosis not present

## 2019-12-01 DIAGNOSIS — I4891 Unspecified atrial fibrillation: Secondary | ICD-10-CM | POA: Diagnosis not present

## 2019-12-01 DIAGNOSIS — L309 Dermatitis, unspecified: Secondary | ICD-10-CM | POA: Diagnosis not present

## 2019-12-01 DIAGNOSIS — Z4800 Encounter for change or removal of nonsurgical wound dressing: Secondary | ICD-10-CM | POA: Diagnosis not present

## 2019-12-01 DIAGNOSIS — U071 COVID-19: Secondary | ICD-10-CM | POA: Diagnosis not present

## 2019-12-01 DIAGNOSIS — I83018 Varicose veins of right lower extremity with ulcer other part of lower leg: Secondary | ICD-10-CM | POA: Diagnosis not present

## 2019-12-01 DIAGNOSIS — J019 Acute sinusitis, unspecified: Secondary | ICD-10-CM | POA: Diagnosis not present

## 2019-12-01 DIAGNOSIS — Z03818 Encounter for observation for suspected exposure to other biological agents ruled out: Secondary | ICD-10-CM | POA: Diagnosis not present

## 2019-12-01 DIAGNOSIS — Z1159 Encounter for screening for other viral diseases: Secondary | ICD-10-CM | POA: Diagnosis not present

## 2019-12-01 DIAGNOSIS — Z79899 Other long term (current) drug therapy: Secondary | ICD-10-CM | POA: Diagnosis not present

## 2019-12-04 DIAGNOSIS — I83018 Varicose veins of right lower extremity with ulcer other part of lower leg: Secondary | ICD-10-CM | POA: Diagnosis not present

## 2019-12-04 DIAGNOSIS — Z7902 Long term (current) use of antithrombotics/antiplatelets: Secondary | ICD-10-CM | POA: Diagnosis not present

## 2019-12-04 DIAGNOSIS — I4891 Unspecified atrial fibrillation: Secondary | ICD-10-CM | POA: Diagnosis not present

## 2019-12-04 DIAGNOSIS — I10 Essential (primary) hypertension: Secondary | ICD-10-CM | POA: Diagnosis not present

## 2019-12-04 DIAGNOSIS — Z79899 Other long term (current) drug therapy: Secondary | ICD-10-CM | POA: Diagnosis not present

## 2019-12-04 DIAGNOSIS — Z4800 Encounter for change or removal of nonsurgical wound dressing: Secondary | ICD-10-CM | POA: Diagnosis not present

## 2019-12-04 DIAGNOSIS — L97819 Non-pressure chronic ulcer of other part of right lower leg with unspecified severity: Secondary | ICD-10-CM | POA: Diagnosis not present

## 2019-12-05 ENCOUNTER — Other Ambulatory Visit: Payer: Self-pay | Admitting: Internal Medicine

## 2019-12-06 DIAGNOSIS — I4891 Unspecified atrial fibrillation: Secondary | ICD-10-CM | POA: Diagnosis not present

## 2019-12-06 DIAGNOSIS — L97819 Non-pressure chronic ulcer of other part of right lower leg with unspecified severity: Secondary | ICD-10-CM | POA: Diagnosis not present

## 2019-12-06 DIAGNOSIS — Z79899 Other long term (current) drug therapy: Secondary | ICD-10-CM | POA: Diagnosis not present

## 2019-12-06 DIAGNOSIS — I10 Essential (primary) hypertension: Secondary | ICD-10-CM | POA: Diagnosis not present

## 2019-12-06 DIAGNOSIS — I83018 Varicose veins of right lower extremity with ulcer other part of lower leg: Secondary | ICD-10-CM | POA: Diagnosis not present

## 2019-12-06 DIAGNOSIS — Z4800 Encounter for change or removal of nonsurgical wound dressing: Secondary | ICD-10-CM | POA: Diagnosis not present

## 2019-12-06 DIAGNOSIS — Z7902 Long term (current) use of antithrombotics/antiplatelets: Secondary | ICD-10-CM | POA: Diagnosis not present

## 2019-12-08 DIAGNOSIS — I4891 Unspecified atrial fibrillation: Secondary | ICD-10-CM | POA: Diagnosis not present

## 2019-12-08 DIAGNOSIS — Z4800 Encounter for change or removal of nonsurgical wound dressing: Secondary | ICD-10-CM | POA: Diagnosis not present

## 2019-12-08 DIAGNOSIS — I10 Essential (primary) hypertension: Secondary | ICD-10-CM | POA: Diagnosis not present

## 2019-12-08 DIAGNOSIS — Z79899 Other long term (current) drug therapy: Secondary | ICD-10-CM | POA: Diagnosis not present

## 2019-12-08 DIAGNOSIS — I83018 Varicose veins of right lower extremity with ulcer other part of lower leg: Secondary | ICD-10-CM | POA: Diagnosis not present

## 2019-12-08 DIAGNOSIS — L97819 Non-pressure chronic ulcer of other part of right lower leg with unspecified severity: Secondary | ICD-10-CM | POA: Diagnosis not present

## 2019-12-08 DIAGNOSIS — Z7902 Long term (current) use of antithrombotics/antiplatelets: Secondary | ICD-10-CM | POA: Diagnosis not present

## 2019-12-11 ENCOUNTER — Telehealth: Payer: Self-pay | Admitting: Internal Medicine

## 2019-12-11 DIAGNOSIS — Z79899 Other long term (current) drug therapy: Secondary | ICD-10-CM | POA: Diagnosis not present

## 2019-12-11 DIAGNOSIS — R509 Fever, unspecified: Secondary | ICD-10-CM | POA: Diagnosis not present

## 2019-12-11 DIAGNOSIS — E860 Dehydration: Secondary | ICD-10-CM | POA: Diagnosis not present

## 2019-12-11 DIAGNOSIS — Z4800 Encounter for change or removal of nonsurgical wound dressing: Secondary | ICD-10-CM | POA: Diagnosis not present

## 2019-12-11 DIAGNOSIS — R05 Cough: Secondary | ICD-10-CM | POA: Diagnosis not present

## 2019-12-11 DIAGNOSIS — Z7902 Long term (current) use of antithrombotics/antiplatelets: Secondary | ICD-10-CM | POA: Diagnosis not present

## 2019-12-11 DIAGNOSIS — I10 Essential (primary) hypertension: Secondary | ICD-10-CM | POA: Diagnosis not present

## 2019-12-11 DIAGNOSIS — I4891 Unspecified atrial fibrillation: Secondary | ICD-10-CM | POA: Diagnosis not present

## 2019-12-11 DIAGNOSIS — I83018 Varicose veins of right lower extremity with ulcer other part of lower leg: Secondary | ICD-10-CM | POA: Diagnosis not present

## 2019-12-11 DIAGNOSIS — R5383 Other fatigue: Secondary | ICD-10-CM | POA: Diagnosis not present

## 2019-12-11 DIAGNOSIS — L97819 Non-pressure chronic ulcer of other part of right lower leg with unspecified severity: Secondary | ICD-10-CM | POA: Diagnosis not present

## 2019-12-11 NOTE — Telephone Encounter (Signed)
Patient son calling in curious about reducing or dropping patients fluid pill, furosemide 40mg . Son is stating that patient is covid positive and severely dehydrated. Patients PCP office Suzan Garibaldi) gave her a bag of fluid and now patient and son wonder if it is best to reduce or stop the fluid pill to aid in hydration  Patient son also calling to ask advice about patient taking Allerga.  Please advise

## 2019-12-11 NOTE — Telephone Encounter (Signed)
Spoke with patient's son. States patient tested positive for COVID on 11/22/19.  Went for f/u with PCP today and tested negative. Patient was dehydrated so PCP Suzan Garibaldi, NP in [place] gave patient fluids. Son said he was told to call us to see about decreasing patient's furosemide or not. We do not have any updated information on patient and son does not know patient's recent weights or BP readings. Advised it would be good to have records from PCP.  I called PCP's office and no answer. Left message without giving any patient information to call us back so we can gather more information.  Called son back to let him know I was not able to get any information this afternoon. He has our fax number and will as well tomorrow to see if they can fax COVID results, any other lab work and office note to [place].

## 2019-12-13 DIAGNOSIS — Z4800 Encounter for change or removal of nonsurgical wound dressing: Secondary | ICD-10-CM | POA: Diagnosis not present

## 2019-12-13 DIAGNOSIS — I10 Essential (primary) hypertension: Secondary | ICD-10-CM | POA: Diagnosis not present

## 2019-12-13 DIAGNOSIS — Z7902 Long term (current) use of antithrombotics/antiplatelets: Secondary | ICD-10-CM | POA: Diagnosis not present

## 2019-12-13 DIAGNOSIS — I4891 Unspecified atrial fibrillation: Secondary | ICD-10-CM | POA: Diagnosis not present

## 2019-12-13 DIAGNOSIS — L97819 Non-pressure chronic ulcer of other part of right lower leg with unspecified severity: Secondary | ICD-10-CM | POA: Diagnosis not present

## 2019-12-13 DIAGNOSIS — I83018 Varicose veins of right lower extremity with ulcer other part of lower leg: Secondary | ICD-10-CM | POA: Diagnosis not present

## 2019-12-13 DIAGNOSIS — Z79899 Other long term (current) drug therapy: Secondary | ICD-10-CM | POA: Diagnosis not present

## 2019-12-14 NOTE — Telephone Encounter (Signed)
Called and spoke with son, ok per DPR, and he verbalized understanding. He will let the patient know.

## 2019-12-14 NOTE — Telephone Encounter (Signed)
Ok to hold furosemide until oral intake returns to normal.  She should monitor her weight and leg swelling closely, particularly as Pedialyte has a high salt load.  [name], MD [facility]

## 2019-12-14 NOTE — Telephone Encounter (Signed)
Spoke with Melissa at [facility] Garibaldi, [place]. States they faxed the office note yesterday. I was not able to locate. She will refax office note along with Covid result and any other lab work.   Spoke with patient's son again. Offered appointment to see Dr End tomorrow; however, he is not able to bring her. States she is "doing fine." States she is drinking water and Pedialyte and urinating well to his knowledge.  He says PCP did not have them stop the furosemide yet and wanted him to ask Dr End. Son does not think they drew lab work. Awaiting any further notes or labs if available. Routing to Dr End for further review.

## 2019-12-15 DIAGNOSIS — Z79899 Other long term (current) drug therapy: Secondary | ICD-10-CM | POA: Diagnosis not present

## 2019-12-15 DIAGNOSIS — I4891 Unspecified atrial fibrillation: Secondary | ICD-10-CM | POA: Diagnosis not present

## 2019-12-15 DIAGNOSIS — I83018 Varicose veins of right lower extremity with ulcer other part of lower leg: Secondary | ICD-10-CM | POA: Diagnosis not present

## 2019-12-15 DIAGNOSIS — Z4800 Encounter for change or removal of nonsurgical wound dressing: Secondary | ICD-10-CM | POA: Diagnosis not present

## 2019-12-15 DIAGNOSIS — L97819 Non-pressure chronic ulcer of other part of right lower leg with unspecified severity: Secondary | ICD-10-CM | POA: Diagnosis not present

## 2019-12-15 DIAGNOSIS — I10 Essential (primary) hypertension: Secondary | ICD-10-CM | POA: Diagnosis not present

## 2019-12-15 DIAGNOSIS — Z7902 Long term (current) use of antithrombotics/antiplatelets: Secondary | ICD-10-CM | POA: Diagnosis not present

## 2019-12-18 DIAGNOSIS — Z79899 Other long term (current) drug therapy: Secondary | ICD-10-CM | POA: Diagnosis not present

## 2019-12-18 DIAGNOSIS — L97819 Non-pressure chronic ulcer of other part of right lower leg with unspecified severity: Secondary | ICD-10-CM | POA: Diagnosis not present

## 2019-12-18 DIAGNOSIS — I10 Essential (primary) hypertension: Secondary | ICD-10-CM | POA: Diagnosis not present

## 2019-12-18 DIAGNOSIS — Z7902 Long term (current) use of antithrombotics/antiplatelets: Secondary | ICD-10-CM | POA: Diagnosis not present

## 2019-12-18 DIAGNOSIS — Z4800 Encounter for change or removal of nonsurgical wound dressing: Secondary | ICD-10-CM | POA: Diagnosis not present

## 2019-12-18 DIAGNOSIS — I83018 Varicose veins of right lower extremity with ulcer other part of lower leg: Secondary | ICD-10-CM | POA: Diagnosis not present

## 2019-12-18 DIAGNOSIS — I4891 Unspecified atrial fibrillation: Secondary | ICD-10-CM | POA: Diagnosis not present

## 2019-12-20 ENCOUNTER — Encounter: Payer: Self-pay | Admitting: Internal Medicine

## 2019-12-20 ENCOUNTER — Ambulatory Visit (INDEPENDENT_AMBULATORY_CARE_PROVIDER_SITE_OTHER): Payer: Medicare Other | Admitting: Internal Medicine

## 2019-12-20 VITALS — BP 110/70 | HR 80 | Ht 62.0 in | Wt 132.0 lb

## 2019-12-20 DIAGNOSIS — I5032 Chronic diastolic (congestive) heart failure: Secondary | ICD-10-CM

## 2019-12-20 DIAGNOSIS — I739 Peripheral vascular disease, unspecified: Secondary | ICD-10-CM | POA: Diagnosis not present

## 2019-12-20 DIAGNOSIS — I10 Essential (primary) hypertension: Secondary | ICD-10-CM | POA: Diagnosis not present

## 2019-12-20 DIAGNOSIS — I4819 Other persistent atrial fibrillation: Secondary | ICD-10-CM

## 2019-12-20 MED ORDER — FUROSEMIDE 40 MG PO TABS: 40.0000 mg | ORAL_TABLET | Freq: Every day | ORAL | 3 refills | Status: DC | PRN

## 2019-12-20 NOTE — Patient Instructions (Addendum)
Medication Instructions:  Your physician has recommended you make the following change in your medication:  1 - CHANGE Furosemide to 40 mg daily as needed for weight gain >2 pounds in 24 hours or >5 pounds In 1 week.  *If you need a refill on your cardiac medications before your next appointment, please call your pharmacy*  Lab Work: none If you have labs (blood work) drawn today and your tests are completely normal, you will receive your results only by: Marland Kitchen MyChart Message (if you have MyChart) OR . A paper copy in the mail If you have any lab test that is abnormal or we need to change your treatment, we will call you to review the results.  Testing/Procedures: none  Follow-Up: At [facility], you and your health needs are our priority.  As part of our continuing mission to provide you with exceptional heart care, we have created designated Provider Care Teams.  These Care Teams include your primary Cardiologist (physician) and Advanced Practice Providers (APPs -  Physician Assistants and Nurse Practitioners) who all work together to provide you with the care you need, when you need it.  Your next appointment:   3 month(s)  The format for your next appointment:   In Person  Provider:    You may see [name], MD or one of the following Advanced Practice Providers on your designated Care Team:    Suddreth Hodgkins, NP  Christell Faith, PA-C  Marrianne Mood, PA-C

## 2019-12-20 NOTE — Progress Notes (Signed)
Follow-up Outpatient Visit Date: 12/20/2019  Primary Care Provider: Philmore Pali, NP [place] [address]  Chief Complaint: Follow-up atrial fibrillation, HFpEF, and PAD.  HPI:  Ms. Alsbrook is a 86 y.o. female with history of persistent atrial fibrillation, chronic HFpEF, heart disease with at least moderate right popliteal artery stenosis, hypertension, hyperlipidemia, prediabetes, and chronic kidney disease stage III, who presents for follow-up of atrial fibrillation and HFpEF.  Ms. Nan was last seen in our office in late December time she was doing well.  Abdominal aortogram with bilateral lower extremity runoff was recommended for further evaluation of PAD identified on noninvasive studies and slowly healing leg wounds concerning for critical limb ischemia.  The patient declined at that time.  Ms. Full contracted COVID-19 last month associated poor appetite and dehydration.  Furosemide was held.  She has since returned to normal PO intake.  While ill, she was drinking Pedialyte regularly and would like to continue drinking this.  She has not had any significant leg edema.  She has restarted furosemide 40 mg daily.  Ms. Franciosa only complaint today is of continued sinus and right ear congestion.  She denies chest pain, shortness of breath, palpitations, lightheadedness, edema, and leg pain.  She reports that her right calf wounds have almost completely healed.  --------------------------------------------------------------------------------------------------  Cardiovascular History & Procedures: Cardiovascular Problems:  Atrial fibrillation  HFpEF  Mitral, tricuspid, and aortic regurgitation  Risk Factors:  Hypertension, hyperlipidemia, prediabetes, and age > 91  Cath/PCI:  None  CV Surgery:  None  EP Procedures and Devices:  None  Non-Invasive Evaluation(s):  Bilateral lower extremity arterial Dopplers (11/03/2019): Unable to obtain accurate  ABI/TBI's.  Doppler examination indicates moderate (50 to 74%) right popliteal artery stenosis.  Occlusion to high-grade stenosis in mid to distal right posterior tibial artery noted.  TTE (09/12/2019): Normal LV size. LVEF 55-60% with moderate focal thickening of the basal septum. Normal RV systolic function with mild dilation. Mild to moderate left atrial enlargement. Mild right atrial enlargement. Moderate mitral and severe tricuspid regurgitation. Mild aortic regurgitation. Elevated central venous pressure. Mild pulmonary hypertension.  Recent CV Pertinent Labs: Lab Results  Component Value Date   CHOL 132 11/07/2019   HDL 42 11/07/2019   LDLCALC 76 11/07/2019   TRIG 71 11/07/2019   CHOLHDL 3.1 11/07/2019   K 4.0 11/07/2019   BUN 23 11/07/2019   BUN 22 10/04/2019   CREATININE 1.10 (H) 11/07/2019    Past medical and surgical history were reviewed and updated in EPIC.  Current Meds  Medication Sig  . aspirin 325 MG tablet Take 325 mg by mouth as needed.  . latanoprost (XALATAN) 0.005 % ophthalmic solution Place 1 drop into both eyes at bedtime.  . potassium chloride SA (KLOR-CON) 20 MEQ tablet Take 1 tablet (20 mEq total) by mouth daily.  . Rivaroxaban (XARELTO) 15 MG TABS tablet Take 1 tablet (15 mg total) by mouth daily with supper.  . [DISCONTINUED] ALPRAZolam (XANAX) 1 MG tablet Take 0.5 mg by mouth 2 (two) times daily.    Allergies: Other and Sulfa antibiotics  Social History   Tobacco Use  . Smoking status: Never Smoker  . Smokeless tobacco: Never Used  Substance Use Topics  . Alcohol use: No  . Drug use: Never    Family History  Problem Relation Age of Onset  . Stroke Mother   . Arrhythmia Son     Review of Systems: A 12-system review of systems was performed and  was negative except as noted in the HPI.  --------------------------------------------------------------------------------------------------  Physical Exam: BP 110/70 (BP Location: Right  Arm, Patient Position: Sitting, Cuff Size: Normal)   Pulse 80   Ht 5\' 2"  (1.575 m)   Wt 132 lb (59.9 kg)   SpO2 99%   BMI 24.14 kg/m   General:  NAD.  Accompanied by her son. HEENT: No conjunctival pallor or scleral icterus. Facemask in place. Neck: Supple without lymphadenopathy, thyromegaly, JVD, or HJR. Lungs: Normal work of breathing. Clear to auscultation bilaterally without wheezes or crackles. Heart: Irregularly irregular without murmurs. Abd: Bowel sounds present. Soft, NT/ND without hepatosplenomegaly Ext: No lower extremity edema. Trace pedal pulses bilaterally. Skin: Warm and dry.  Clean bandages noted along the posterolateral aspect of the right calf.  EKG:  Atrial fibrillation with borderline LVH and nonspecific T wave abnormality.  Ventricular rate 80 bpm.  Lab Results  Component Value Date   WBC 8.6 04/06/2015   HGB 13.0 04/06/2015   HCT 39.7 04/06/2015   MCV 92.7 04/06/2015   PLT 169 04/06/2015    Lab Results  Component Value Date   NA 137 11/07/2019   K 4.0 11/07/2019   CL 95 (L) 11/07/2019   CO2 31 11/07/2019   BUN 23 11/07/2019   CREATININE 1.10 (H) 11/07/2019   GLUCOSE 107 (H) 11/07/2019   ALT 17 04/06/2015    Lab Results  Component Value Date   CHOL 132 11/07/2019   HDL 42 11/07/2019   LDLCALC 76 11/07/2019   TRIG 71 11/07/2019   CHOLHDL 3.1 11/07/2019    --------------------------------------------------------------------------------------------------  ASSESSMENT AND PLAN: Persistent atrial fibrillation: Ms. Paskett remains asymptomatic with good ventricular rate control.  She has previously declined cardioversion, which I think is reasonable given her age and lack of symptoms.  We will continue indefinite anticoagulation with rivaroxaban 15 mg daily (dose reduced based on creatinine clearance).  PAD: BLE arterial Dopplers concerning for significant right popliteal and runoff disease that could be impacting her right calf wounds that have  been slow to heal.  Wounds were not undressed today, though Ms. Greenhill reports that they have almost completely healed.  She previously declined angiography to better define her arterial anatomy, given concern for critical limb ischemia.  She states today that she would like to think about it some more before committing to any additional procedures.  LDL minimally above goal at 76 on last check in 10/2019.  We will readdress adding a statin at follow-up.  Chronic HFpEF: Ms. Ossman appears euvolemic on exam today.  I think it is reasonable for her to use furosemide 40 mg daily as needed for weight gain and/or edema.  I encouraged her to stay adequately hydrated with water but asked that she avoid Pedialyte if she is otherwise eating and drinking normally in order to minimize sodium intake.  Hypertension: BP well-controlled today.  No medication changes at this time.  Follow-up: Return to clinic in 3 months.  [name], MD 12/21/2019 12:14 PM

## 2019-12-21 ENCOUNTER — Encounter: Payer: Self-pay | Admitting: Internal Medicine

## 2019-12-21 DIAGNOSIS — I739 Peripheral vascular disease, unspecified: Secondary | ICD-10-CM | POA: Insufficient documentation

## 2019-12-22 DIAGNOSIS — L97819 Non-pressure chronic ulcer of other part of right lower leg with unspecified severity: Secondary | ICD-10-CM | POA: Diagnosis not present

## 2019-12-22 DIAGNOSIS — I10 Essential (primary) hypertension: Secondary | ICD-10-CM | POA: Diagnosis not present

## 2019-12-22 DIAGNOSIS — I83018 Varicose veins of right lower extremity with ulcer other part of lower leg: Secondary | ICD-10-CM | POA: Diagnosis not present

## 2019-12-22 DIAGNOSIS — Z4800 Encounter for change or removal of nonsurgical wound dressing: Secondary | ICD-10-CM | POA: Diagnosis not present

## 2019-12-22 DIAGNOSIS — I4891 Unspecified atrial fibrillation: Secondary | ICD-10-CM | POA: Diagnosis not present

## 2019-12-22 DIAGNOSIS — Z7902 Long term (current) use of antithrombotics/antiplatelets: Secondary | ICD-10-CM | POA: Diagnosis not present

## 2019-12-22 DIAGNOSIS — Z79899 Other long term (current) drug therapy: Secondary | ICD-10-CM | POA: Diagnosis not present

## 2019-12-25 DIAGNOSIS — I10 Essential (primary) hypertension: Secondary | ICD-10-CM | POA: Diagnosis not present

## 2019-12-25 DIAGNOSIS — L97819 Non-pressure chronic ulcer of other part of right lower leg with unspecified severity: Secondary | ICD-10-CM | POA: Diagnosis not present

## 2019-12-25 DIAGNOSIS — Z79899 Other long term (current) drug therapy: Secondary | ICD-10-CM | POA: Diagnosis not present

## 2019-12-25 DIAGNOSIS — I4891 Unspecified atrial fibrillation: Secondary | ICD-10-CM | POA: Diagnosis not present

## 2019-12-25 DIAGNOSIS — I83018 Varicose veins of right lower extremity with ulcer other part of lower leg: Secondary | ICD-10-CM | POA: Diagnosis not present

## 2019-12-25 DIAGNOSIS — Z7902 Long term (current) use of antithrombotics/antiplatelets: Secondary | ICD-10-CM | POA: Diagnosis not present

## 2019-12-25 DIAGNOSIS — Z4800 Encounter for change or removal of nonsurgical wound dressing: Secondary | ICD-10-CM | POA: Diagnosis not present

## 2019-12-27 DIAGNOSIS — Z4800 Encounter for change or removal of nonsurgical wound dressing: Secondary | ICD-10-CM | POA: Diagnosis not present

## 2019-12-27 DIAGNOSIS — I83018 Varicose veins of right lower extremity with ulcer other part of lower leg: Secondary | ICD-10-CM | POA: Diagnosis not present

## 2019-12-27 DIAGNOSIS — I10 Essential (primary) hypertension: Secondary | ICD-10-CM | POA: Diagnosis not present

## 2019-12-27 DIAGNOSIS — I4891 Unspecified atrial fibrillation: Secondary | ICD-10-CM | POA: Diagnosis not present

## 2019-12-27 DIAGNOSIS — Z7902 Long term (current) use of antithrombotics/antiplatelets: Secondary | ICD-10-CM | POA: Diagnosis not present

## 2019-12-27 DIAGNOSIS — Z79899 Other long term (current) drug therapy: Secondary | ICD-10-CM | POA: Diagnosis not present

## 2019-12-27 DIAGNOSIS — L97819 Non-pressure chronic ulcer of other part of right lower leg with unspecified severity: Secondary | ICD-10-CM | POA: Diagnosis not present

## 2019-12-29 DIAGNOSIS — Z7902 Long term (current) use of antithrombotics/antiplatelets: Secondary | ICD-10-CM | POA: Diagnosis not present

## 2019-12-29 DIAGNOSIS — Z4800 Encounter for change or removal of nonsurgical wound dressing: Secondary | ICD-10-CM | POA: Diagnosis not present

## 2019-12-29 DIAGNOSIS — I83018 Varicose veins of right lower extremity with ulcer other part of lower leg: Secondary | ICD-10-CM | POA: Diagnosis not present

## 2019-12-29 DIAGNOSIS — I10 Essential (primary) hypertension: Secondary | ICD-10-CM | POA: Diagnosis not present

## 2019-12-29 DIAGNOSIS — I4891 Unspecified atrial fibrillation: Secondary | ICD-10-CM | POA: Diagnosis not present

## 2019-12-29 DIAGNOSIS — L97819 Non-pressure chronic ulcer of other part of right lower leg with unspecified severity: Secondary | ICD-10-CM | POA: Diagnosis not present

## 2019-12-29 DIAGNOSIS — Z79899 Other long term (current) drug therapy: Secondary | ICD-10-CM | POA: Diagnosis not present

## 2020-01-01 DIAGNOSIS — Z79899 Other long term (current) drug therapy: Secondary | ICD-10-CM | POA: Diagnosis not present

## 2020-01-01 DIAGNOSIS — Z4800 Encounter for change or removal of nonsurgical wound dressing: Secondary | ICD-10-CM | POA: Diagnosis not present

## 2020-01-01 DIAGNOSIS — Z7902 Long term (current) use of antithrombotics/antiplatelets: Secondary | ICD-10-CM | POA: Diagnosis not present

## 2020-01-01 DIAGNOSIS — I4891 Unspecified atrial fibrillation: Secondary | ICD-10-CM | POA: Diagnosis not present

## 2020-01-01 DIAGNOSIS — I10 Essential (primary) hypertension: Secondary | ICD-10-CM | POA: Diagnosis not present

## 2020-01-01 DIAGNOSIS — L97819 Non-pressure chronic ulcer of other part of right lower leg with unspecified severity: Secondary | ICD-10-CM | POA: Diagnosis not present

## 2020-01-01 DIAGNOSIS — I83018 Varicose veins of right lower extremity with ulcer other part of lower leg: Secondary | ICD-10-CM | POA: Diagnosis not present

## 2020-01-03 DIAGNOSIS — I10 Essential (primary) hypertension: Secondary | ICD-10-CM | POA: Diagnosis not present

## 2020-01-03 DIAGNOSIS — Z4800 Encounter for change or removal of nonsurgical wound dressing: Secondary | ICD-10-CM | POA: Diagnosis not present

## 2020-01-03 DIAGNOSIS — Z7902 Long term (current) use of antithrombotics/antiplatelets: Secondary | ICD-10-CM | POA: Diagnosis not present

## 2020-01-03 DIAGNOSIS — Z79899 Other long term (current) drug therapy: Secondary | ICD-10-CM | POA: Diagnosis not present

## 2020-01-03 DIAGNOSIS — L97819 Non-pressure chronic ulcer of other part of right lower leg with unspecified severity: Secondary | ICD-10-CM | POA: Diagnosis not present

## 2020-01-03 DIAGNOSIS — I83018 Varicose veins of right lower extremity with ulcer other part of lower leg: Secondary | ICD-10-CM | POA: Diagnosis not present

## 2020-01-03 DIAGNOSIS — I4891 Unspecified atrial fibrillation: Secondary | ICD-10-CM | POA: Diagnosis not present

## 2020-01-11 ENCOUNTER — Telehealth: Payer: Self-pay | Admitting: Internal Medicine

## 2020-01-11 NOTE — Telephone Encounter (Signed)
Patients son calling to state patient noticed a small amount of blood in her urine. Patient states this has happened two days within the last week. Patient said Dr. Saunders Revel advised to call in if this happened, it might be due to medication issues.  Please advise

## 2020-01-11 NOTE — Telephone Encounter (Signed)
I recommend that Kristen Graham stop anticoagulation until urine clears and she has been assessed by her PCP or a urologist.  [name], MD [place]

## 2020-01-11 NOTE — Telephone Encounter (Signed)
Spoke with patient's son, ok per DPR.  Patient reports having pink-tinged urine on two occasions over the past week.  No bright red blood. Denies dizziness, lightheadedness, shortness of breath, palpitations, or chest pain. Reports patient was out in her yard planting flowers today with no problems.  Her BP was 102/71 earlier this week but asymptomatic. Last BP son took was around 120/71. Advised them to continue to medications and to monitor and if new or worsening symptoms develop over night to go to ER or call on call physician. Advised tomorrow for them to call her PCP. In the meantime, I will forward to Dr End for advice.

## 2020-01-12 ENCOUNTER — Telehealth: Payer: Self-pay | Admitting: *Deleted

## 2020-01-12 DIAGNOSIS — R310 Gross hematuria: Secondary | ICD-10-CM | POA: Diagnosis not present

## 2020-01-12 DIAGNOSIS — N390 Urinary tract infection, site not specified: Secondary | ICD-10-CM | POA: Diagnosis not present

## 2020-01-12 DIAGNOSIS — R300 Dysuria: Secondary | ICD-10-CM | POA: Diagnosis not present

## 2020-01-12 NOTE — Telephone Encounter (Signed)
Received incoming call from patient's PCP and found patient has significant UTI with the presence of blood in urine. Plan is to have patient take Cipro for 10 days then recheck U/A on 01/22/20 once antibiotics completed. They want to make sure this plan is ok with Dr End. They will call us back after the repeat to let us know if blood is still present.  She expressed the son is concerned about the patient being off Xarelto for this period of time. Advised for patient to remain off Xarelto as advised in recent entry by Dr End and I will route to him for review at this time. She was appreciative.

## 2020-01-12 NOTE — Telephone Encounter (Signed)
Patient's son notified to have patient stop Xarelto until urine clears and she is evaluated by PCP or urologist. Patient is going to her PCP today for evaluation.

## 2020-01-15 NOTE — Telephone Encounter (Signed)
Faxed to [facility] at [phone].  Patient's son called with these recommendations as well and he verbalized understanding. He was very [employer] attorney.

## 2020-01-15 NOTE — Telephone Encounter (Signed)
There is potential interaction between rivaroxaban and ciprofloxacin, with increased anticoagulation affect when these two medications are taken together.  I think the risk of stroke is low if rivaroxaban is held for a short period of time.  I therefore recommend holding rivaroxaban until after hematuria has resolved and course of ciprofloxacin completed.  Prior dose of rivaroxaban can then be restarted.  [name], MD [facility]

## 2020-01-22 DIAGNOSIS — I482 Chronic atrial fibrillation, unspecified: Secondary | ICD-10-CM | POA: Diagnosis not present

## 2020-01-22 DIAGNOSIS — R300 Dysuria: Secondary | ICD-10-CM | POA: Diagnosis not present

## 2020-01-22 DIAGNOSIS — N390 Urinary tract infection, site not specified: Secondary | ICD-10-CM | POA: Diagnosis not present

## 2020-02-07 DIAGNOSIS — Z961 Presence of intraocular lens: Secondary | ICD-10-CM | POA: Diagnosis not present

## 2020-02-07 DIAGNOSIS — H40003 Preglaucoma, unspecified, bilateral: Secondary | ICD-10-CM | POA: Diagnosis not present

## 2020-02-18 ENCOUNTER — Other Ambulatory Visit: Payer: Self-pay | Admitting: Internal Medicine

## 2020-02-19 NOTE — Telephone Encounter (Signed)
Refill Request.  

## 2020-02-20 ENCOUNTER — Telehealth: Payer: Self-pay | Admitting: Internal Medicine

## 2020-02-20 DIAGNOSIS — N390 Urinary tract infection, site not specified: Secondary | ICD-10-CM | POA: Diagnosis not present

## 2020-02-20 DIAGNOSIS — R300 Dysuria: Secondary | ICD-10-CM | POA: Diagnosis not present

## 2020-02-20 NOTE — Telephone Encounter (Signed)
Per son pharmacy says that  Microbid 100 mg po q d x 10 days  abx should be ok to take with xarelto please call to confirm

## 2020-02-20 NOTE — Telephone Encounter (Signed)
I see no obvious interactions between Xarelto and Macrobid.  [name], MD [facility]

## 2020-02-20 NOTE — Telephone Encounter (Signed)
Patient son is calling, states patient has a UTI, please call to advise what patient can take that will not interact with Xarelto.

## 2020-02-21 NOTE — Telephone Encounter (Signed)
Patient's son notified and verbalized understanding of Dr End's recommendations.  

## 2020-02-22 DIAGNOSIS — E785 Hyperlipidemia, unspecified: Secondary | ICD-10-CM | POA: Diagnosis not present

## 2020-02-22 DIAGNOSIS — Z0000 Encounter for general adult medical examination without abnormal findings: Secondary | ICD-10-CM | POA: Diagnosis not present

## 2020-02-22 DIAGNOSIS — Z9181 History of falling: Secondary | ICD-10-CM | POA: Diagnosis not present

## 2020-03-08 DIAGNOSIS — N390 Urinary tract infection, site not specified: Secondary | ICD-10-CM | POA: Diagnosis not present

## 2020-03-18 DIAGNOSIS — L97929 Non-pressure chronic ulcer of unspecified part of left lower leg with unspecified severity: Secondary | ICD-10-CM | POA: Diagnosis not present

## 2020-03-18 DIAGNOSIS — I83019 Varicose veins of right lower extremity with ulcer of unspecified site: Secondary | ICD-10-CM | POA: Diagnosis not present

## 2020-03-18 DIAGNOSIS — L97919 Non-pressure chronic ulcer of unspecified part of right lower leg with unspecified severity: Secondary | ICD-10-CM | POA: Diagnosis not present

## 2020-03-18 DIAGNOSIS — R296 Repeated falls: Secondary | ICD-10-CM | POA: Diagnosis not present

## 2020-03-18 DIAGNOSIS — I83029 Varicose veins of left lower extremity with ulcer of unspecified site: Secondary | ICD-10-CM | POA: Diagnosis not present

## 2020-03-20 ENCOUNTER — Ambulatory Visit: Payer: Medicare Other | Admitting: Internal Medicine

## 2020-03-31 ENCOUNTER — Other Ambulatory Visit: Payer: Self-pay | Admitting: Family

## 2020-04-05 DIAGNOSIS — R202 Paresthesia of skin: Secondary | ICD-10-CM | POA: Diagnosis not present

## 2020-04-05 DIAGNOSIS — Z79899 Other long term (current) drug therapy: Secondary | ICD-10-CM | POA: Diagnosis not present

## 2020-04-05 DIAGNOSIS — D485 Neoplasm of uncertain behavior of skin: Secondary | ICD-10-CM | POA: Diagnosis not present

## 2020-04-09 DIAGNOSIS — C44529 Squamous cell carcinoma of skin of other part of trunk: Secondary | ICD-10-CM | POA: Diagnosis not present

## 2020-04-09 DIAGNOSIS — Z139 Encounter for screening, unspecified: Secondary | ICD-10-CM | POA: Diagnosis not present

## 2020-04-16 DIAGNOSIS — R200 Anesthesia of skin: Secondary | ICD-10-CM | POA: Diagnosis not present

## 2020-04-16 DIAGNOSIS — C4441 Basal cell carcinoma of skin of scalp and neck: Secondary | ICD-10-CM | POA: Diagnosis not present

## 2020-04-16 DIAGNOSIS — D485 Neoplasm of uncertain behavior of skin: Secondary | ICD-10-CM | POA: Diagnosis not present

## 2020-05-06 ENCOUNTER — Ambulatory Visit (INDEPENDENT_AMBULATORY_CARE_PROVIDER_SITE_OTHER): Payer: Medicare Other | Admitting: Internal Medicine

## 2020-05-06 ENCOUNTER — Encounter: Payer: Self-pay | Admitting: Internal Medicine

## 2020-05-06 ENCOUNTER — Other Ambulatory Visit: Payer: Self-pay

## 2020-05-06 VITALS — BP 110/72 | Ht 61.0 in | Wt 144.5 lb

## 2020-05-06 DIAGNOSIS — I5032 Chronic diastolic (congestive) heart failure: Secondary | ICD-10-CM

## 2020-05-06 DIAGNOSIS — I10 Essential (primary) hypertension: Secondary | ICD-10-CM

## 2020-05-06 DIAGNOSIS — R319 Hematuria, unspecified: Secondary | ICD-10-CM | POA: Diagnosis not present

## 2020-05-06 DIAGNOSIS — I4821 Permanent atrial fibrillation: Secondary | ICD-10-CM | POA: Diagnosis not present

## 2020-05-06 DIAGNOSIS — Z79899 Other long term (current) drug therapy: Secondary | ICD-10-CM | POA: Diagnosis not present

## 2020-05-06 MED ORDER — FUROSEMIDE 40 MG PO TABS: ORAL_TABLET | ORAL | 1 refills | Status: DC

## 2020-05-06 MED ORDER — RIVAROXABAN 15 MG PO TABS: ORAL_TABLET | ORAL | 2 refills | Status: DC

## 2020-05-06 MED ORDER — METOPROLOL SUCCINATE ER 25 MG PO TB24: 37.5000 mg | ORAL_TABLET | Freq: Two times a day (BID) | ORAL | 2 refills | Status: DC

## 2020-05-06 NOTE — Patient Instructions (Signed)
Medication Instructions:  Your physician has recommended you make the following change in your medication:  1- INCREASE Furosemide to 40 mg (1 Tablet) by mouth two times a day for 1 week, then DECREASE back to 40 mg (1 tablet) by mouth once a day.  *If you need a refill on your cardiac medications before your next appointment, please call your pharmacy*   Lab Work: Your physician recommends that you return for lab work in: [place].   If you have labs (blood work) drawn today and your tests are completely normal, you will receive your results only by: Marland Kitchen MyChart Message (if you have MyChart) OR . A paper copy in the mail If you have any lab test that is abnormal or we need to change your treatment, we will call you to review the results.   Testing/Procedures: none   Follow-Up: At [facility], you and your health needs are our priority.  As part of our continuing mission to provide you with exceptional heart care, we have created designated Provider Care Teams.  These Care Teams include your primary Cardiologist (physician) and Advanced Practice Providers (APPs -  Physician Assistants and Nurse Practitioners) who all work together to provide you with the care you need, when you need it.  We recommend signing up for the patient portal called "MyChart".  Sign up information is provided on this After Visit Summary.  MyChart is used to connect with patients for Virtual Visits (Telemedicine).  Patients are able to view lab/test results, encounter notes, upcoming appointments, etc.  Non-urgent messages can be sent to your provider as well.   To learn more about what you can do with MyChart, go to [email or web address].    Your next appointment:   2-3 month(s)  The format for your next appointment:   In Person  Provider:    You may see [name], MD or one of the following Advanced Practice Providers on your designated Care Team:    Turko Hodgkins, NP  Christell Faith,  PA-C  Marrianne Mood, PA-C

## 2020-05-06 NOTE — Progress Notes (Signed)
Follow-up Outpatient Visit Date: 05/06/2020  Primary Care Provider: Philmore Pali, NP [place] [address]  Chief Complaint: Leg selling  HPI:  Ms. Kristen Graham is a 86 y.o. female with history of persistent atrial fibrillation, chronic HFpEF, heart disease with at least moderate right popliteal artery stenosis, hypertension, hyperlipidemia, prediabetes, and chronic kidney disease stage III, who presents for follow-up of atrial fibrillation and HFpEF.  I last saw Ms. Chamberland in 12/2019, at which time her only complaint was of sinus and right ear congestion.  Her right calf wounds had almost completely healed.  We agreed to make her furosemide as needed for weight gain/edema rather than having it be standing.  No other medication changes or additional testing were pursued.  Today, Ms. Bousquet reports that she has been doing well, though she has noticed increased swelling in both legs over the last few weeks.  She reports being compliant with her medications.  She has a healing abrasion overlying the left shin but otherwise does not have any wounds.  She denies chest pain, shortness of breath, palpitations, and lightheadedness.  She endorses some blood-tinged urine a few months ago in the setting of UTI.  This has since resolved and reports a normal UA through her PCP a few weeks ago.  --------------------------------------------------------------------------------------------------  Cardiovascular History & Procedures: Cardiovascular Problems:  Atrial fibrillation  HFpEF  Mitral, tricuspid, and aortic regurgitation  Risk Factors:  Hypertension, hyperlipidemia, prediabetes, and age > 65  Cath/PCI:  None  CV Surgery:  None  EP Procedures and Devices:  None  Non-Invasive Evaluation(s):  Bilateral lower extremity arterial Dopplers (11/03/2019): Unable to obtain accurate ABI/TBI's.  Doppler examination indicates moderate (50 to 74%) right popliteal artery stenosis.   Occlusion to high-grade stenosis in mid to distal right posterior tibial artery noted.  TTE (09/12/2019): Normal LV size. LVEF 55-60% with moderate focal thickening of the basal septum. Normal RV systolic function with mild dilation. Mild to moderate left atrial enlargement. Mild right atrial enlargement. Moderate mitral and severe tricuspid regurgitation. Mild aortic regurgitation. Elevated central venous pressure. Mild pulmonary hypertension.  Recent CV Pertinent Labs: Lab Results  Component Value Date   CHOL 132 11/07/2019   HDL 42 11/07/2019   LDLCALC 76 11/07/2019   TRIG 71 11/07/2019   CHOLHDL 3.1 11/07/2019   K 4.0 11/07/2019   BUN 23 11/07/2019   BUN 22 10/04/2019   CREATININE 1.10 (H) 11/07/2019    Past medical and surgical history were reviewed and updated in EPIC.  Current Meds  Medication Sig  . aspirin 325 MG tablet Take 325 mg by mouth as needed.  . furosemide (LASIX) 40 MG tablet Take 1 tablet (40 mg total) by mouth daily as needed (Weight gain >2lb in 24 hours or >5 lb in 1 week.).  Marland Kitchen latanoprost (XALATAN) 0.005 % ophthalmic solution Place 1 drop into both eyes at bedtime.  . metoprolol succinate (TOPROL-XL) 25 MG 24 hr tablet TAKE 1.5 TABLETS (37.5 MG TOTAL) BY MOUTH 2 (TWO) TIMES DAILY.  Marland Kitchen oxyCODONE (OXY IR/ROXICODONE) 5 MG immediate release tablet Take 1-2 tablets (5-10 mg total) by mouth every 4 (four) hours as needed for moderate pain.  . potassium chloride SA (KLOR-CON) 20 MEQ tablet Take 1 tablet (20 mEq total) by mouth daily.  Alveda Reasons 15 MG TABS tablet TAKE 1 TABLET BY MOUTH EVERY DAY WITH SUPPER    Allergies: Other and Sulfa antibiotics  Social History   Tobacco Use  . Smoking status: Never  Smoker  . Smokeless tobacco: Never Used  Vaping Use  . Vaping Use: Never used  Substance Use Topics  . Alcohol use: No  . Drug use: Never    Family History  Problem Relation Age of Onset  . Stroke Mother   . Arrhythmia Son     Review of  Systems: A 12-system review of systems was performed and was negative except as noted in the HPI.  --------------------------------------------------------------------------------------------------  Physical Exam: BP 110/72 (BP Location: Left Arm, Patient Position: Sitting, Cuff Size: Normal)   Ht 5\' 1"  (1.549 m)   Wt 144 lb 8 oz (65.5 kg)   SpO2 97%   BMI 27.30 kg/m   General:  NAD.  Accompanied by her son. Neck: No JVD or HJR. Lungs: Mildly diminished breath sounds throughout without wheezes or crackles. Heart: Irregularly irregular without murmurs, rubs, or gallops. Abd: Soft, NT/ND. Ext: 2+ calf edema bilaterally.  EKG:  Atrial fibrillation (ventricular rate 86 bpm), LAD, and non-specific T wave changes.  Lab Results  Component Value Date   WBC 8.6 04/06/2015   HGB 13.0 04/06/2015   HCT 39.7 04/06/2015   MCV 92.7 04/06/2015   PLT 169 04/06/2015    Lab Results  Component Value Date   NA 137 11/07/2019   K 4.0 11/07/2019   CL 95 (L) 11/07/2019   CO2 31 11/07/2019   BUN 23 11/07/2019   CREATININE 1.10 (H) 11/07/2019   GLUCOSE 107 (H) 11/07/2019   ALT 17 04/06/2015    Lab Results  Component Value Date   CHOL 132 11/07/2019   HDL 42 11/07/2019   LDLCALC 76 11/07/2019   TRIG 71 11/07/2019   CHOLHDL 3.1 11/07/2019    --------------------------------------------------------------------------------------------------  ASSESSMENT AND PLAN: Chronic HFpEF: BLE has increased with associated 10-12 pound weight gain since our last visit in February.  Ms. Kegg has been taking furosemide 40 mg daily.  I have recommended increasing this to 40 mg BID for at least 1 week, until swelling and weight are back to baseline.  We will check a BMP today to ensure stable renal function and potassium.  Permanent atrial fibrillation: Ventricular rate is adequately controlled.  We will continue current doses of metoprolol succinate and rivaroxaban.  Hematuria: Ms. Meikle reports  blood-tinged urine a few months ago in the setting of UTI.  She is somewhat ambivalent about resolution of this, though she denies having seen more hematuria since having a UA done by her PCP a few weeks ago (she believes it was normal).  I have advised her to contact her PCP immediately if she has further hematuria.  Follow-up: Return to clinic in 2-3 months.  [name], MD 05/06/2020 11:35 AM

## 2020-05-07 ENCOUNTER — Telehealth: Payer: Self-pay | Admitting: *Deleted

## 2020-05-07 LAB — BASIC METABOLIC PANEL
BUN/Creatinine Ratio: 24 (ref 12–28)
BUN: 26 mg/dL (ref 8–27)
CO2: 24 mmol/L (ref 20–29)
Calcium: 9.4 mg/dL (ref 8.7–10.3)
Chloride: 98 mmol/L (ref 96–106)
Creatinine, Ser: 1.09 mg/dL — ABNORMAL HIGH (ref 0.57–1.00)
GFR calc Af Amer: 52 mL/min/{1.73_m2} — ABNORMAL LOW (ref >59)
GFR calc non Af Amer: 45 mL/min/{1.73_m2} — ABNORMAL LOW (ref >59)
Glucose: 94 mg/dL (ref 65–99)
Potassium: 3.4 mmol/L — ABNORMAL LOW (ref 3.5–5.2)
Sodium: 138 mmol/L (ref 134–144)

## 2020-05-07 MED ORDER — POTASSIUM CHLORIDE CRYS ER 20 MEQ PO TBCR: 40.0000 meq | EXTENDED_RELEASE_TABLET | Freq: Every day | ORAL | 1 refills | Status: DC

## 2020-05-07 NOTE — Telephone Encounter (Signed)
-----   Message from [name], MD sent at 05/07/2020  6:44 AM EDT ----- Labs stable, though potassium is slightly low.  I recommend that Ms. Schindler increase her potassium chloride to 40 mEq daily.

## 2020-05-07 NOTE — Telephone Encounter (Signed)
Results called to patient's son, ok per DPR. He verbalized understanding to increase the potassium to 40 mEq (2 tablets) by mouth once a day. Rx sent to pharmacy.

## 2020-07-01 ENCOUNTER — Encounter: Payer: Self-pay | Admitting: Internal Medicine

## 2020-07-01 DIAGNOSIS — N183 Chronic kidney disease, stage 3 unspecified: Secondary | ICD-10-CM | POA: Diagnosis not present

## 2020-07-01 DIAGNOSIS — Z6822 Body mass index (BMI) 22.0-22.9, adult: Secondary | ICD-10-CM | POA: Diagnosis not present

## 2020-07-01 DIAGNOSIS — I4891 Unspecified atrial fibrillation: Secondary | ICD-10-CM | POA: Diagnosis not present

## 2020-07-12 DIAGNOSIS — R5383 Other fatigue: Secondary | ICD-10-CM | POA: Diagnosis not present

## 2020-07-12 DIAGNOSIS — N390 Urinary tract infection, site not specified: Secondary | ICD-10-CM | POA: Diagnosis not present

## 2020-07-12 DIAGNOSIS — R300 Dysuria: Secondary | ICD-10-CM | POA: Diagnosis not present

## 2020-07-12 DIAGNOSIS — R609 Edema, unspecified: Secondary | ICD-10-CM | POA: Diagnosis not present

## 2020-08-13 DIAGNOSIS — H353130 Nonexudative age-related macular degeneration, bilateral, stage unspecified: Secondary | ICD-10-CM | POA: Diagnosis not present

## 2020-08-13 NOTE — Progress Notes (Signed)
Follow-up Outpatient Visit Date: 08/14/2020  Primary Care Provider: [place] [id number]  Chief Complaint: Follow-up leg swelling and atrial fibrillatoin  HPI:  Ms. Gunia is a 86 y.o. female with history of persistent atrial fibrillation, chronic HFpEF, heart disease with at least moderate right popliteal artery stenosis, hypertension, hyperlipidemia, prediabetes, and chronic kidney disease stage III, who presents for follow-up of a-fib and heart failure.  I last saw her in June, at which time she reported increased swelling in both legs over a few weks.  Her weight was also up 10-12 pounds since February.  I recommended increasing furosemide to 40 mg twice daily x 1 week (or until weight was back to baseline).  Today, Ms. Ergle reports that she is feeling well other than bruising and mild discomfort in her left foot/ankle after recently dropping a heavy flashlight onto this area.  Her right calf wounds have finally healed.  She has mild ankle/calf edema but notes that it typically resolves when she elevates her legs.  She denies chest pain, shortness of breath, palpitations, lightheadedness, and bleeding.  --------------------------------------------------------------------------------------------------  Cardiovascular History & Procedures: Cardiovascular Problems:  Atrial fibrillation  HFpEF  Mitral, tricuspid, and aortic regurgitation  Risk Factors:  Hypertension, hyperlipidemia, prediabetes, and age > 53  Cath/PCI:  None  CV Surgery:  None  EP Procedures and Devices:  None  Non-Invasive Evaluation(s):  Bilateral lower extremity arterial Dopplers (11/03/2019): Unable to obtain accurate ABI/TBI's. Doppler examination indicates moderate (50 to 74%) right popliteal artery stenosis. Occlusion to high-grade stenosis in mid to distal right posterior tibial artery noted.  TTE (09/12/2019): Normal LV size. LVEF 55-60% with moderate  focal thickening of the basal septum. Normal RV systolic function with mild dilation. Mild to moderate left atrial enlargement. Mild right atrial enlargement. Moderate mitral and severe tricuspid regurgitation. Mild aortic regurgitation. Elevated central venous pressure. Mild pulmonary hypertension.  Recent CV Pertinent Labs: Lab Results  Component Value Date   CHOL 132 11/07/2019   HDL 42 11/07/2019   LDLCALC 76 11/07/2019   TRIG 71 11/07/2019   CHOLHDL 3.1 11/07/2019   K 3.4 (L) 05/06/2020   BUN 26 05/06/2020   CREATININE 1.09 (H) 05/06/2020    Past medical and surgical history were reviewed and updated in EPIC.  Current Meds  Medication Sig  . ALPRAZolam (XANAX) 0.5 MG tablet Take 0.5 mg by mouth 2 (two) times daily as needed.  Marland Kitchen aspirin 325 MG tablet Take 325 mg by mouth as needed.  . furosemide (LASIX) 40 MG tablet Take 40 mg (1 tablet) by mouth two times a day for 1 week, then decrease back to 40 mg  (1 tablet) by mouth daily. (Patient taking differently: Take 40 mg by mouth daily. )  . metoprolol succinate (TOPROL-XL) 25 MG 24 hr tablet Take 1.5 tablets (37.5 mg total) by mouth 2 (two) times daily.  Marland Kitchen oxyCODONE (OXY IR/ROXICODONE) 5 MG immediate release tablet Take 1-2 tablets (5-10 mg total) by mouth every 4 (four) hours as needed for moderate pain.  . potassium chloride SA (KLOR-CON) 20 MEQ tablet Take 2 tablets (40 mEq total) by mouth daily.  . Rivaroxaban (XARELTO) 15 MG TABS tablet TAKE 1 TABLET BY MOUTH EVERY DAY WITH SUPPER    Allergies: Other and Sulfa antibiotics  Social History   Tobacco Use  . Smoking status: Never Smoker  . Smokeless tobacco: Never Used  Vaping Use  . Vaping Use: Never used  Substance Use Topics  .  Alcohol use: No  . Drug use: Never    Family History  Problem Relation Age of Onset  . Stroke Mother   . Arrhythmia Son     Review of Systems: A 12-system review of systems was performed and was negative except as noted in the  HPI.  --------------------------------------------------------------------------------------------------  Physical Exam: BP 110/64 (BP Location: Right Arm, Patient Position: Sitting, Cuff Size: Normal)   Pulse 92   Ht 5\' 1"  (1.549 m)   Wt 139 lb 6 oz (63.2 kg)   SpO2 97%   BMI 26.33 kg/m   General:  NAD.  Accompanied by her son. Neck: No JVD or HJR. Lungs: Normal work of breathing. Clear to auscultation bilaterally without wheezes or crackles. Heart: Irregularly irregular without murmurs, robs, or gallops. Abd: Bowel sounds present. Soft, NT/ND. Ext: 2+ ankle edema extending to the distal 1/3 of both calves. Skin: Bruising noted above the left ankle.  No wounds/ulcers.  EKG:  Atrial fibrillation with left axis deviation, low voltage, and non-specific ST/T changes.  No significant change since 05/06/2020.  Lab Results  Component Value Date   WBC 8.6 04/06/2015   HGB 13.0 04/06/2015   HCT 39.7 04/06/2015   MCV 92.7 04/06/2015   PLT 169 04/06/2015    Lab Results  Component Value Date   NA 138 05/06/2020   K 3.4 (L) 05/06/2020   CL 98 05/06/2020   CO2 24 05/06/2020   BUN 26 05/06/2020   CREATININE 1.09 (H) 05/06/2020   GLUCOSE 94 05/06/2020   ALT 17 04/06/2015    Lab Results  Component Value Date   CHOL 132 11/07/2019   HDL 42 11/07/2019   LDLCALC 76 11/07/2019   TRIG 71 11/07/2019   CHOLHDL 3.1 11/07/2019    --------------------------------------------------------------------------------------------------  ASSESSMENT AND PLAN: Chronic HFpEF and leg edema: Ms. Kintz appears relatively stable since our last visit with continued leg edema.  However, she reports that the swelling essentially resolves with leg elevation, suggesting a component of venous insufficiency that is contributing.  She otherwise denies symptoms of heart failure, though her functional capacity is limited.  I have recommended that she try taking furosemide 40 mg BID for the next few days until  her leg edema has resolved, after which time she can return back to daily dosing.  I am reluctant to have her wear compression stockings given underlying PAD.  Permanent atrial fibrillation: Ms. Empie continues to have reasonable rate control.  We will continue metoprolol succinate 375 mg BID.  I am reluctant to increase this further in the setting of low normal blood pressure.  We will continue rivaroxaban 15 mg daily for stroke prophylaxis (GFR < 50).  PAD: Severe RLE PAD noted on non-invasive studies this spring.  Ms. Tregre previously refused angiography/intervention, which she confirms today.  Fortunately, she is asymptomatic with interval healing of left calf wounds.  She is on rivaroxaban in lieu of aspirin given atrial fibrillation.  Defer addition of statin at this time, given advanced age and reasonable lipids on most recent check in 10/2019 (LDL 76).  Follow-up: Return to clinic in 4 months.  [name], MD 08/15/2020 7:11 AM

## 2020-08-14 ENCOUNTER — Other Ambulatory Visit: Payer: Self-pay

## 2020-08-14 ENCOUNTER — Encounter: Payer: Self-pay | Admitting: Internal Medicine

## 2020-08-14 ENCOUNTER — Ambulatory Visit: Payer: Medicare Other | Admitting: Internal Medicine

## 2020-08-14 VITALS — BP 110/64 | HR 92 | Ht 61.0 in | Wt 139.4 lb

## 2020-08-14 DIAGNOSIS — I739 Peripheral vascular disease, unspecified: Secondary | ICD-10-CM

## 2020-08-14 DIAGNOSIS — I5032 Chronic diastolic (congestive) heart failure: Secondary | ICD-10-CM

## 2020-08-14 DIAGNOSIS — R600 Localized edema: Secondary | ICD-10-CM | POA: Diagnosis not present

## 2020-08-14 DIAGNOSIS — I4821 Permanent atrial fibrillation: Secondary | ICD-10-CM | POA: Diagnosis not present

## 2020-08-14 NOTE — Patient Instructions (Signed)
Medication Instructions:  Your physician recommends that you continue on your current medications as directed. Please refer to the Current Medication list given to you today.  *If you need a refill on your cardiac medications before your next appointment, please call your pharmacy*  Follow-Up: At [facility], you and your health needs are our priority.  As part of our continuing mission to provide you with exceptional heart care, we have created designated Provider Care Teams.  These Care Teams include your primary Cardiologist (physician) and Advanced Practice Providers (APPs -  Physician Assistants and Nurse Practitioners) who all work together to provide you with the care you need, when you need it.  We recommend signing up for the patient portal called "MyChart".  Sign up information is provided on this After Visit Summary.  MyChart is used to connect with patients for Virtual Visits (Telemedicine).  Patients are able to view lab/test results, encounter notes, upcoming appointments, etc.  Non-urgent messages can be sent to your provider as well.   To learn more about what you can do with MyChart, go to [email or web address].    Your next appointment:   4 months  The format for your next appointment:   In Person  Provider:   You may see Christopher End, MD or one of the following Advanced Practice Providers on your designated Care Team:    Christopher Berge, NP  Ryan Dunn, PA-C  Jacquelyn Visser, PA-C  Cadence Furth, PA-C   

## 2020-08-15 ENCOUNTER — Encounter: Payer: Self-pay | Admitting: Internal Medicine

## 2020-08-15 DIAGNOSIS — I4821 Permanent atrial fibrillation: Secondary | ICD-10-CM | POA: Insufficient documentation

## 2020-08-20 DIAGNOSIS — Z139 Encounter for screening, unspecified: Secondary | ICD-10-CM | POA: Diagnosis not present

## 2020-08-20 DIAGNOSIS — L986 Other infiltrative disorders of the skin and subcutaneous tissue: Secondary | ICD-10-CM | POA: Diagnosis not present

## 2020-08-20 DIAGNOSIS — R52 Pain, unspecified: Secondary | ICD-10-CM | POA: Diagnosis not present

## 2020-08-20 DIAGNOSIS — R609 Edema, unspecified: Secondary | ICD-10-CM | POA: Diagnosis not present

## 2020-08-20 DIAGNOSIS — L299 Pruritus, unspecified: Secondary | ICD-10-CM | POA: Diagnosis not present

## 2020-08-20 DIAGNOSIS — C44529 Squamous cell carcinoma of skin of other part of trunk: Secondary | ICD-10-CM | POA: Diagnosis not present

## 2020-08-20 DIAGNOSIS — Z23 Encounter for immunization: Secondary | ICD-10-CM | POA: Diagnosis not present

## 2020-08-22 DIAGNOSIS — S9002XA Contusion of left ankle, initial encounter: Secondary | ICD-10-CM | POA: Diagnosis not present

## 2020-08-22 DIAGNOSIS — R52 Pain, unspecified: Secondary | ICD-10-CM | POA: Diagnosis not present

## 2020-08-22 DIAGNOSIS — R609 Edema, unspecified: Secondary | ICD-10-CM | POA: Diagnosis not present

## 2020-08-29 DIAGNOSIS — S81811D Laceration without foreign body, right lower leg, subsequent encounter: Secondary | ICD-10-CM | POA: Diagnosis not present

## 2020-08-29 DIAGNOSIS — S81811A Laceration without foreign body, right lower leg, initial encounter: Secondary | ICD-10-CM | POA: Diagnosis not present

## 2020-09-19 DIAGNOSIS — L97919 Non-pressure chronic ulcer of unspecified part of right lower leg with unspecified severity: Secondary | ICD-10-CM | POA: Diagnosis not present

## 2020-09-19 DIAGNOSIS — L97129 Non-pressure chronic ulcer of left thigh with unspecified severity: Secondary | ICD-10-CM | POA: Diagnosis not present

## 2020-09-19 DIAGNOSIS — L97929 Non-pressure chronic ulcer of unspecified part of left lower leg with unspecified severity: Secondary | ICD-10-CM | POA: Diagnosis not present

## 2020-09-19 DIAGNOSIS — I83019 Varicose veins of right lower extremity with ulcer of unspecified site: Secondary | ICD-10-CM | POA: Diagnosis not present

## 2020-09-30 ENCOUNTER — Emergency Department: Payer: Medicare Other

## 2020-09-30 ENCOUNTER — Encounter: Payer: Self-pay | Admitting: *Deleted

## 2020-09-30 ENCOUNTER — Emergency Department
Admission: EM | Admit: 2020-09-30 | Discharge: 2020-09-30 | Disposition: A | Discharge status: Home / Self Care | Payer: Medicare Other | Attending: Emergency Medicine | Admitting: Emergency Medicine

## 2020-09-30 ENCOUNTER — Telehealth: Payer: Self-pay | Admitting: Internal Medicine

## 2020-09-30 DIAGNOSIS — R9082 White matter disease, unspecified: Secondary | ICD-10-CM | POA: Diagnosis not present

## 2020-09-30 DIAGNOSIS — N3000 Acute cystitis without hematuria: Secondary | ICD-10-CM

## 2020-09-30 DIAGNOSIS — I509 Heart failure, unspecified: Secondary | ICD-10-CM | POA: Diagnosis not present

## 2020-09-30 DIAGNOSIS — Z7982 Long term (current) use of aspirin: Secondary | ICD-10-CM | POA: Insufficient documentation

## 2020-09-30 DIAGNOSIS — R410 Disorientation, unspecified: Secondary | ICD-10-CM | POA: Diagnosis not present

## 2020-09-30 DIAGNOSIS — I4891 Unspecified atrial fibrillation: Secondary | ICD-10-CM | POA: Diagnosis not present

## 2020-09-30 DIAGNOSIS — N390 Urinary tract infection, site not specified: Secondary | ICD-10-CM | POA: Diagnosis not present

## 2020-09-30 DIAGNOSIS — I110 Hypertensive heart disease with heart failure: Secondary | ICD-10-CM | POA: Insufficient documentation

## 2020-09-30 DIAGNOSIS — Z79899 Other long term (current) drug therapy: Secondary | ICD-10-CM | POA: Diagnosis not present

## 2020-09-30 DIAGNOSIS — G9340 Encephalopathy, unspecified: Secondary | ICD-10-CM | POA: Diagnosis not present

## 2020-09-30 DIAGNOSIS — L03116 Cellulitis of left lower limb: Secondary | ICD-10-CM | POA: Diagnosis not present

## 2020-09-30 DIAGNOSIS — Z7901 Long term (current) use of anticoagulants: Secondary | ICD-10-CM | POA: Diagnosis not present

## 2020-09-30 DIAGNOSIS — R531 Weakness: Secondary | ICD-10-CM | POA: Diagnosis not present

## 2020-09-30 DIAGNOSIS — R4182 Altered mental status, unspecified: Secondary | ICD-10-CM | POA: Diagnosis present

## 2020-09-30 DIAGNOSIS — L03115 Cellulitis of right lower limb: Secondary | ICD-10-CM | POA: Diagnosis not present

## 2020-09-30 DIAGNOSIS — I4821 Permanent atrial fibrillation: Secondary | ICD-10-CM | POA: Insufficient documentation

## 2020-09-30 DIAGNOSIS — Z6822 Body mass index (BMI) 22.0-22.9, adult: Secondary | ICD-10-CM | POA: Diagnosis not present

## 2020-09-30 LAB — URINALYSIS, COMPLETE (UACMP) WITH MICROSCOPIC
Bilirubin Urine: NEGATIVE
Glucose, UA: NEGATIVE mg/dL
Ketones, ur: NEGATIVE mg/dL
Nitrite: NEGATIVE
Protein, ur: NEGATIVE mg/dL
Specific Gravity, Urine: 1.02 (ref 1.005–1.030)
pH: 5 (ref 5.0–8.0)

## 2020-09-30 LAB — BASIC METABOLIC PANEL
Anion gap: 12 (ref 5–15)
BUN: 26 mg/dL — ABNORMAL HIGH (ref 8–23)
CO2: 22 mmol/L (ref 22–32)
Calcium: 9 mg/dL (ref 8.9–10.3)
Chloride: 99 mmol/L (ref 98–111)
Creatinine, Ser: 1.24 mg/dL — ABNORMAL HIGH (ref 0.44–1.00)
GFR, Estimated: 42 mL/min — ABNORMAL LOW (ref >60)
Glucose, Bld: 118 mg/dL — ABNORMAL HIGH (ref 70–99)
Potassium: 3.1 mmol/L — ABNORMAL LOW (ref 3.5–5.1)
Sodium: 133 mmol/L — ABNORMAL LOW (ref 135–145)

## 2020-09-30 LAB — CBC WITH DIFFERENTIAL/PLATELET
Abs Immature Granulocytes: 0.06 10*3/uL (ref 0.00–0.07)
Basophils Absolute: 0.1 10*3/uL (ref 0.0–0.1)
Basophils Relative: 0 %
Eosinophils Absolute: 0 10*3/uL (ref 0.0–0.5)
Eosinophils Relative: 0 %
HCT: 43.8 % (ref 36.0–46.0)
Hemoglobin: 14.6 g/dL (ref 12.0–15.0)
Immature Granulocytes: 0 %
Lymphocytes Relative: 11 %
Lymphs Abs: 1.5 10*3/uL (ref 0.7–4.0)
MCH: 31.3 pg (ref 26.0–34.0)
MCHC: 33.3 g/dL (ref 30.0–36.0)
MCV: 94 fL (ref 80.0–100.0)
Monocytes Absolute: 0.6 10*3/uL (ref 0.1–1.0)
Monocytes Relative: 4 %
Neutro Abs: 11.7 10*3/uL — ABNORMAL HIGH (ref 1.7–7.7)
Neutrophils Relative %: 85 %
Platelets: 251 10*3/uL (ref 150–400)
RBC: 4.66 MIL/uL (ref 3.87–5.11)
RDW: 17.1 % — ABNORMAL HIGH (ref 11.5–15.5)
WBC: 13.8 10*3/uL — ABNORMAL HIGH (ref 4.0–10.5)
nRBC: 0 % (ref 0.0–0.2)

## 2020-09-30 MED ORDER — ACETAMINOPHEN 500 MG PO TABS
1000.0000 mg | ORAL_TABLET | Freq: Once | ORAL | Status: AC
  Administered 2020-09-30: 1000 mg via ORAL
  Filled 2020-09-30: qty 2

## 2020-09-30 MED ORDER — CEPHALEXIN 500 MG PO CAPS: 500.0000 mg | ORAL_CAPSULE | Freq: Two times a day (BID) | ORAL | 0 refills | Status: AC

## 2020-09-30 MED ORDER — METOPROLOL SUCCINATE ER 25 MG PO TB24
37.5000 mg | ORAL_TABLET | Freq: Once | ORAL | Status: AC
  Administered 2020-09-30: 37.5 mg via ORAL
  Filled 2020-09-30: qty 1.5

## 2020-09-30 MED ORDER — METOPROLOL TARTRATE 5 MG/5ML IV SOLN
5.0000 mg | INTRAVENOUS | Status: DC | PRN
  Administered 2020-09-30 (×2): 5 mg via INTRAVENOUS
  Filled 2020-09-30 (×2): qty 5

## 2020-09-30 MED ORDER — CEPHALEXIN 500 MG PO CAPS
500.0000 mg | ORAL_CAPSULE | Freq: Once | ORAL | Status: AC
  Administered 2020-09-30: 500 mg via ORAL
  Filled 2020-09-30: qty 1

## 2020-09-30 MED ORDER — POTASSIUM CHLORIDE CRYS ER 20 MEQ PO TBCR
40.0000 meq | EXTENDED_RELEASE_TABLET | Freq: Once | ORAL | Status: AC
  Administered 2020-09-30: 40 meq via ORAL
  Filled 2020-09-30: qty 2

## 2020-09-30 NOTE — ED Triage Notes (Signed)
Pt from home via [facility]. Family states pt has increasing confusion. Pt has hx dementia. Pt has skin tear on R upper arm. VSS. Hx ASFIB.

## 2020-09-30 NOTE — ED Notes (Signed)
Wounds on right and left lower leg dressed per MD verbal order.

## 2020-09-30 NOTE — ED Notes (Signed)
Pts right arm redressed by this RN due to bleeding. Family agreeable to discharge. Pt taken to car via wheelchair and then assisted into car by ED staff and family.

## 2020-09-30 NOTE — Telephone Encounter (Signed)
STAT if HR is under 50 or over 120 (normal HR is 60-100 beats per minute)  1) What is your heart rate? 150  2) Do you have a log of your heart rate readings (document readings)? No recent ed visit for HR pcp suggested contacting Cards  3) Do you have any other symptoms? Per son patient was not responding well and was sent to ed for hr 156 .  Son concerned about sustained HR and hx Afib .  BP 120/70 .    Requested notes pcp office visit from today .  Scheduled with End 11/17. Patient declined same day visit .

## 2020-09-30 NOTE — ED Notes (Signed)
Per patients family - Pt to ED

## 2020-09-30 NOTE — Telephone Encounter (Signed)
No answer. Left message to call back.   

## 2020-09-30 NOTE — ED Notes (Signed)
Pt resting in bed with daughter at bedside. Agreeable with plan to this point and denying further questions. NAD noted.

## 2020-09-30 NOTE — ED Provider Notes (Signed)
[facility] Emergency Department Provider Note   ____________________________________________   First MD Initiated Contact with Patient 09/30/20 0031     (approximate)  I have reviewed the triage vital signs and the nursing notes.   HISTORY  Chief Complaint Altered Mental Status    HPI Kristen Graham is a 86 y.o. female with past medical history of hypertension, hyperlipidemia, permanent A. fib on Xarelto, PAD, and CHF who presents to the ED for altered mental status.  History is limited due to patient's confusion, she states she is not sure why she is here.  Per EMS, family called 911 this evening after patient was more confused than usual.  Speaking with her daughter over the phone, patient has not been diagnosed with dementia but for the past couple of months she has seemed to get confused whenever the sun goes down.  She had a similar episode this evening, but family was concerned she was more confused than usual.  Patient does not remember this, states she has been feeling fine.  Family denies any recent fevers, cough, chest pain, shortness of breath, vomiting, or diarrhea.  She has chronic wounds to her left lower extremity and chronic swelling in her lower extremities, which family states have been gradually improving.  She arrives with dressings in place to her bilateral lower extremities.  Patient currently denies any pain to her legs or any other complaints.        Past Medical History:  Diagnosis Date  . Atrial fibrillation ([place]) 2020  . Glaucoma   . Hyperlipidemia   . Hypertension   . Left wrist fracture   . Prediabetes     Patient Active Problem List   Diagnosis Date Noted  . Permanent atrial fibrillation ([place]) 08/15/2020  . Hematuria 05/06/2020  . PAD (peripheral artery disease) ([place]) 12/21/2019  . Chronic heart failure with preserved ejection fraction (HFpEF) ([place]) 09/14/2019  . Persistent atrial fibrillation ([place]) 08/29/2019  . Leg  swelling 08/29/2019  . Skin ulcer of right calf, limited to breakdown of skin ([place]) 08/29/2019  . Essential hypertension 08/29/2019  . Open forearm fracture 04/06/2015    Past Surgical History:  Procedure Laterality Date  . CATARACT EXTRACTION    . I & D EXTREMITY Left 04/06/2015   Procedure: IRRIGATION AND DEBRIDEMENT left forearm;  Surgeon: Hessie Knows, MD;  Location: [facility] ORS;  Service: Orthopedics;  Laterality: Left;  . I&D leg    . ORIF RADIAL FRACTURE Left 04/06/2015   Procedure: OPEN REDUCTION INTERNAL FIXATION (ORIF) RADIAL FRACTURE;  Surgeon: Hessie Knows, MD;  Location: [facility] ORS;  Service: Orthopedics;  Laterality: Left;    Prior to Admission medications   Medication Sig Start Date End Date Taking? Authorizing Provider  ALPRAZolam Duanne Moron) 0.5 MG tablet Take 0.5 mg by mouth 2 (two) times daily as needed. 07/01/20   [provider]  aspirin 325 MG tablet Take 325 mg by mouth as needed.    [provider]  cephALEXin (KEFLEX) 500 MG capsule Take 1 capsule (500 mg total) by mouth 2 (two) times daily for 7 days. 09/30/20 10/07/20  Blake Divine, MD  furosemide (LASIX) 40 MG tablet Take 40 mg (1 tablet) by mouth two times a day for 1 week, then decrease back to 40 mg  (1 tablet) by mouth daily. Patient taking differently: Take 40 mg by mouth daily.  05/06/20   End, Harrell Gave, MD  metoprolol succinate (TOPROL-XL) 25 MG 24 hr tablet Take 1.5 tablets (37.5 mg total)  by mouth 2 (two) times daily. 05/06/20   End, Harrell Gave, MD  oxyCODONE (OXY IR/ROXICODONE) 5 MG immediate release tablet Take 1-2 tablets (5-10 mg total) by mouth every 4 (four) hours as needed for moderate pain. 04/08/15   Reche Dixon, PA-C  potassium chloride SA (KLOR-CON) 20 MEQ tablet Take 2 tablets (40 mEq total) by mouth daily. 05/07/20   End, Harrell Gave, MD  Rivaroxaban (XARELTO) 15 MG TABS tablet TAKE 1 TABLET BY MOUTH EVERY DAY WITH SUPPER 05/06/20   End, Harrell Gave, MD    Allergies Other and  Sulfa antibiotics  Family History  Problem Relation Age of Onset  . Stroke Mother   . Arrhythmia Son     Social History Social History   Tobacco Use  . Smoking status: Never Smoker  . Smokeless tobacco: Never Used  Vaping Use  . Vaping Use: Never used  Substance Use Topics  . Alcohol use: No  . Drug use: Never    Review of Systems  Constitutional: No fever/chills.  Positive for confusion. Eyes: No visual changes. ENT: No sore throat. Cardiovascular: Denies chest pain. Respiratory: Denies shortness of breath. Gastrointestinal: No abdominal pain.  No nausea, no vomiting.  No diarrhea.  No constipation. Genitourinary: Negative for dysuria. Musculoskeletal: Negative for back pain. Skin: Negative for rash. Neurological: Negative for headaches, focal weakness or numbness.  ____________________________________________   PHYSICAL EXAM:  VITAL SIGNS: ED Triage Vitals [09/30/20 0037]  Enc Vitals Group     BP      Pulse      Resp      Temp      Temp src      SpO2      Weight      Height      Head Circumference      Peak Flow      Pain Score 0     Pain Loc      Pain Edu?      Excl. in [place]?     Constitutional: Alert and oriented. Eyes: Conjunctivae are normal. Head: Atraumatic. Nose: No congestion/rhinnorhea. Mouth/Throat: Mucous membranes are moist. Neck: Normal ROM Cardiovascular: Tachycardic, irregularly irregular rhythm. Grossly normal heart sounds.  2+ DP pulses bilaterally. Respiratory: Normal respiratory effort.  No retractions. Lungs CTAB. Gastrointestinal: Soft and nontender. No distention. Genitourinary: deferred Musculoskeletal: Pitting edema to knees bilaterally with healing wound to anterior left lower extremity.  Mild erythema and warmth surrounding wound to left anterior lower leg, no focal fluctuance noted.  Minimal tenderness to palpation at the area of wound. Neurologic:  Normal speech and language. No gross focal neurologic deficits are  appreciated. Skin: Skin tear to right upper arm. Psychiatric: Mood and affect are normal. Speech and behavior are normal.  ____________________________________________   LABS (all labs ordered are listed, but only abnormal results are displayed)  Labs Reviewed  CBC WITH DIFFERENTIAL/PLATELET - Abnormal; Notable for the following components:      Result Value   WBC 13.8 (*)    RDW 17.1 (*)    Neutro Abs 11.7 (*)    All other components within normal limits  BASIC METABOLIC PANEL - Abnormal; Notable for the following components:   Sodium 133 (*)    Potassium 3.1 (*)    Glucose, Bld 118 (*)    BUN 26 (*)    Creatinine, Ser 1.24 (*)    GFR, Estimated 42 (*)    All other components within normal limits  URINALYSIS, COMPLETE (UACMP) WITH MICROSCOPIC - Abnormal; Notable for the  following components:   Color, Urine AMBER (*)    APPearance HAZY (*)    Hgb urine dipstick MODERATE (*)    Leukocytes,Ua TRACE (*)    Bacteria, UA MANY (*)    All other components within normal limits  URINE CULTURE   ____________________________________________  EKG  ED ECG REPORT I, Blake Divine, the attending physician, personally viewed and interpreted this ECG.   Date: 09/30/2020  EKG Time: 0:59  Rate: 141  Rhythm: atrial fibrillation, rate 141  Axis: LAD  Intervals:left anterior fascicular block  ST&T Change: None   PROCEDURES  Procedure(s) performed (including Critical Care):  Procedures   ____________________________________________   INITIAL IMPRESSION / ASSESSMENT AND PLAN / ED COURSE       86 year old female with past medical history of hypertension, hyperlipidemia, atrial fibrillation on Xarelto, PAD, and CHF who presents to the ED for episode of confusion this evening similar to prior episodes occurring when the sun goes down over the past 2 months.  On arrival, patient is alert and oriented x4 with no focal neurologic deficit.  Given her episodes of confusion, we will  check CT head but I have a low suspicion for stroke given lack of focal deficits at this time.  Patient was noted to have skin tear to her right upper arm, no bony tenderness associated with this and Xeroform dressing was applied.  She has edema to both of her lower extremities, which family reports is chronic and unchanged, patient denies any shortness of breath and I doubt CHF exacerbation.  She does have a chronic appearing wound to her left anterior lower extremity which is somewhat tender to palpation with some surrounding erythema and warmth.  There is likely some superimposed infection that would benefit from antibiotics.  We will screen labs and UA given her confusion.  EKG shows atrial fibrillation with rapid rate, unclear if patient has been taking her medications appropriately.  We will give IV dose of metoprolol and reassess heart rate.  No ischemic changes noted and I doubt ACS.  Patient's heart rate gradually improving following IV dose of metoprolol as well as her usual p.o. dose.  I suspect heart rate is elevated due to her taking her medications incorrectly.  Patient remains at her baseline mental status, CT head is negative for acute process.  Chest x-ray reviewed by me and shows no infiltrate, edema, or effusion.  No signs to suggest CHF exacerbation at this time.  UA is concerning for infection and we will treat this as well as possible lower extremity cellulitis with Keflex.  Patient is eager to be discharged home and has plans to follow-up with her PCP in 1 day.  She and daughter were counseled to return to the ED for new or worsening symptoms, patient agrees with plan.      ____________________________________________   FINAL CLINICAL IMPRESSION(S) / ED DIAGNOSES  Final diagnoses:  Acute cystitis without hematuria  Cellulitis of left lower extremity  Permanent atrial fibrillation [facility])     ED Discharge Orders         Ordered    cephALEXin (KEFLEX) 500 MG capsule  2 times  daily        09/30/20 0351           Note:  This document was prepared using Dragon voice recognition software and may include unintentional dictation errors.   Blake Divine, MD 09/30/20 [phone]

## 2020-10-01 LAB — URINE CULTURE

## 2020-10-02 ENCOUNTER — Ambulatory Visit (INDEPENDENT_AMBULATORY_CARE_PROVIDER_SITE_OTHER): Payer: Medicare Other | Admitting: Internal Medicine

## 2020-10-02 ENCOUNTER — Encounter: Payer: Self-pay | Admitting: Internal Medicine

## 2020-10-02 ENCOUNTER — Other Ambulatory Visit: Payer: Self-pay

## 2020-10-02 VITALS — BP 108/72 | HR 99 | Ht 61.0 in

## 2020-10-02 DIAGNOSIS — I739 Peripheral vascular disease, unspecified: Secondary | ICD-10-CM

## 2020-10-02 DIAGNOSIS — E785 Hyperlipidemia, unspecified: Secondary | ICD-10-CM

## 2020-10-02 DIAGNOSIS — I5032 Chronic diastolic (congestive) heart failure: Secondary | ICD-10-CM | POA: Diagnosis not present

## 2020-10-02 DIAGNOSIS — I4821 Permanent atrial fibrillation: Secondary | ICD-10-CM | POA: Diagnosis not present

## 2020-10-02 DIAGNOSIS — R600 Localized edema: Secondary | ICD-10-CM | POA: Diagnosis not present

## 2020-10-02 MED ORDER — FUROSEMIDE 40 MG PO TABS: 40.0000 mg | ORAL_TABLET | Freq: Two times a day (BID) | ORAL | 0 refills | Status: DC

## 2020-10-02 MED ORDER — METOPROLOL SUCCINATE ER 25 MG PO TB24: ORAL_TABLET | ORAL | 0 refills | Status: DC

## 2020-10-02 NOTE — Patient Instructions (Signed)
Medication Instructions:  Your physician has recommended you make the following change in your medication:  1- CHANGE Furosemide to 40 mg by mouth two times a day. 2- STOP Aspirin. 3- INCREASE Metoprolol to 50 mg (2 tablets) by mouth every morning and 37.5 mg (1.5 tablets) by mouth every evening.  *If you need a refill on your cardiac medications before your next appointment, please call your pharmacy*  Follow-Up: At [facility], you and your health needs are our priority.  As part of our continuing mission to provide you with exceptional heart care, we have created designated Provider Care Teams.  These Care Teams include your primary Cardiologist (physician) and Advanced Practice Providers (APPs -  Physician Assistants and Nurse Practitioners) who all work together to provide you with the care you need, when you need it.  We recommend signing up for the patient portal called "MyChart".  Sign up information is provided on this After Visit Summary.  MyChart is used to connect with patients for Virtual Visits (Telemedicine).  Patients are able to view lab/test results, encounter notes, upcoming appointments, etc.  Non-urgent messages can be sent to your provider as well.   To learn more about what you can do with MyChart, go to [email or web address].    Your next appointment:   2 week(s)  The format for your next appointment:   In Person  Provider:   You may see [name], MD or one of the following Advanced Practice Providers on your designated Care Team:    Baba Hodgkins, NP  Christell Faith, PA-C  Marrianne Mood, PA-C  Cadence [place], [place]  Laurann [place], NP

## 2020-10-02 NOTE — Telephone Encounter (Signed)
Patient here for her appointment today and is being roomed.  Closing encounter.

## 2020-10-02 NOTE — Progress Notes (Signed)
Follow-up Outpatient Visit Date: 10/02/2020  Primary Care Provider: [place] Pal[place], [place] [id number]  Chief Complaint: Confusion  HPI:  Ms. Bonini is a 86 y.o. female with history of persistent atrial fibrillation, chronic HFpEF, heart disease with at least moderate right popliteal artery stenosis, hypertension, hyperlipidemia, prediabetes, and chronic kidney disease stage III, who presents for follow-up of tachycardia.  I last saw her in late September, at which time she was doing well other than mild discomfort and bruising of the left foot/ankle after recently having dropped a flashlight onto this area.  She presented to the [facility] emergency department 2 days ago, as her family was concerned about increasing confusion.  She was found to be in atrial fibrillation with rapid ventricular response.  She was given IV metoprolol with improvement in her heart rate.  Urinalysis was concerning for urinary tract infection.  Infection of chronic left calf wound was also entertained.  Ms. Vo was discharged on antibiotics.  Today, Ms. Pund reports that she feels well.  However her son, who accompanies her today, is concerned about his mother's overall wellbeing.  On the day of her ED visit, she was very lethargic and hard to arouse.  She seems to have improved a little bit in this regard, though she still has a very difficult time getting around.  She has not had any chest pain, shortness of breath, palpitations, or lightheadedness.  However, her leg swelling has worsened.  She also has bruising in her right thigh after falling out of her chair on the day of her ED visit.  She remains compliant with her medications.  She was recently seen by her PCP, who suggested escalation of metoprolol to improve rate control.  However, Ms. Aro and her son were reluctant to do this given history of soft blood pressures.  Ms. Michelle remains on anticoagulation with apixaban.  She has wounds on both  calves, which her family is dressing on a daily basis.  She feels like they are gradually improving.  --------------------------------------------------------------------------------------------------  Cardiovascular History & Procedures: Cardiovascular Problems:  Atrial fibrillation  HFpEF  Mitral, tricuspid, and aortic regurgitation  Risk Factors:  Hypertension, hyperlipidemia, prediabetes, and age > 65  Cath/PCI:  None  CV Surgery:  None  EP Procedures and Devices:  None  Non-Invasive Evaluation(s):  Bilateral lower extremity arterial Dopplers (11/03/2019): Unable to obtain accurate ABI/TBI's. Doppler examination indicates moderate (50 to 74%) right popliteal artery stenosis. Occlusion to high-grade stenosis in mid to distal right posterior tibial artery noted.  TTE (09/12/2019): Normal LV size. LVEF 55-60% with moderate focal thickening of the basal septum. Normal RV systolic function with mild dilation. Mild to moderate left atrial enlargement. Mild right atrial enlargement. Moderate mitral and severe tricuspid regurgitation. Mild aortic regurgitation. Elevated central venous pressure. Mild pulmonary hypertension.  Recent CV Pertinent Labs: Lab Results  Component Value Date   CHOL 132 11/07/2019   HDL 42 11/07/2019   LDLCALC 76 11/07/2019   TRIG 71 11/07/2019   CHOLHDL 3.1 11/07/2019   K 3.1 (L) 09/30/2020   BUN 26 (H) 09/30/2020   BUN 26 05/06/2020   CREATININE 1.24 (H) 09/30/2020    Past medical and surgical history were reviewed and updated in EPIC.  Current Meds  Medication Sig  . ALPRAZolam (XANAX) 0.5 MG tablet Take 0.5 mg by mouth 2 (two) times daily as needed.  . cephALEXin (KEFLEX) 500 MG capsule Take 1 capsule (500 mg total) by mouth 2 (two)  times daily for 7 days.  . metoprolol succinate (TOPROL-XL) 25 MG 24 hr tablet Take 50 mg (2 tablets) by mouth every morning and take 37.5 mg (1.5 tablets) by mouth every evening.  Marland Kitchen oxyCODONE  (OXY IR/ROXICODONE) 5 MG immediate release tablet Take 1-2 tablets (5-10 mg total) by mouth every 4 (four) hours as needed for moderate pain.  . potassium chloride SA (KLOR-CON) 20 MEQ tablet Take 2 tablets (40 mEq total) by mouth daily.  . Rivaroxaban (XARELTO) 15 MG TABS tablet TAKE 1 TABLET BY MOUTH EVERY DAY WITH SUPPER  . [DISCONTINUED] aspirin 325 MG tablet Take 325 mg by mouth as needed.  . [DISCONTINUED] furosemide (LASIX) 40 MG tablet Take 40 mg (1 tablet) by mouth two times a day for 1 week, then decrease back to 40 mg  (1 tablet) by mouth daily. (Patient taking differently: Take 40 mg by mouth daily. )  . [DISCONTINUED] metoprolol succinate (TOPROL-XL) 25 MG 24 hr tablet Take 1.5 tablets (37.5 mg total) by mouth 2 (two) times daily.    Allergies: Other and Sulfa antibiotics  Social History   Tobacco Use  . Smoking status: Never Smoker  . Smokeless tobacco: Never Used  Vaping Use  . Vaping Use: Never used  Substance Use Topics  . Alcohol use: No  . Drug use: Never    Family History  Problem Relation Age of Onset  . Stroke Mother   . Arrhythmia Son     Review of Systems: A 12-system review of systems was performed and was negative except as noted in the HPI.  --------------------------------------------------------------------------------------------------  Physical Exam: BP 108/72 (BP Location: Right Arm, Patient Position: Sitting, Cuff Size: Normal)   Pulse 99   Ht 5\' 1"  (1.549 m)   SpO2 97%   BMI 28.34 kg/m   General: Chronically ill-appearing woman, seated in a wheelchair.  She is accompanied by her son. Neck: JVP approximately 8 cm without HJR. Lungs: Mildly diminished breath sounds throughout without wheezes or crackles. Heart: Irregularly irregular without murmurs, rubs, or gallops. Abdomen: Soft, nontender, nondistended. Extremities: Both calves are wrapped with gauze.  There is 2+ pitting edema in the proximal calves and thighs.  Pedal pulses are  nonpalpable.  EKG: Atrial fibrillation (ventricular rate 99 bpm) with left axis deviation and poor R wave progression.  Heart rate has decreased since 09/30/2020.  Otherwise, there has been no significant interval change.  Lab Results  Component Value Date   WBC 13.8 (H) 09/30/2020   HGB 14.6 09/30/2020   HCT 43.8 09/30/2020   MCV 94.0 09/30/2020   PLT 251 09/30/2020    Lab Results  Component Value Date   NA 133 (L) 09/30/2020   K 3.1 (L) 09/30/2020   CL 99 09/30/2020   CO2 22 09/30/2020   BUN 26 (H) 09/30/2020   CREATININE 1.24 (H) 09/30/2020   GLUCOSE 118 (H) 09/30/2020   ALT 17 04/06/2015    Lab Results  Component Value Date   CHOL 132 11/07/2019   HDL 42 11/07/2019   LDLCALC 76 11/07/2019   TRIG 71 11/07/2019   CHOLHDL 3.1 11/07/2019    --------------------------------------------------------------------------------------------------  ASSESSMENT AND PLAN: Permanent atrial fibrillation: Ventricular rate control has improved since recent hospitalization.  Rapid ventricular response was likely driven at that time by UTI and cellulitis.  Given borderline elevated heart rates today, I have recommended that we increase morning metoprolol succinate dose to 50 mg and to continue 37.5 mg in the evening.  We will need to be  very cautious with further up titration given soft blood pressures.  We have discussed the risks and benefits of ongoing anticoagulation, given recent falls.  For now, we will plan to continue rivaroxaban 15 mg daily.  I have instructed her to stop taking aspirin, which she has been using intermittently for pain.  Leg edema and chronic HFpEF: This is likely multifactorial including a component of HFpEF and venous stasis.  I have encouraged Ms. Shinn to try to elevate her legs as much as possible.  We will increase her furosemide to 40 mg twice daily.  PAD: Significant lower extremity PAD noted on prior noninvasive studies, worse on the right.  Ms. Droessler  continues to have recurrent wounds on both calves.  I am concerned that some of these wounds may be a manifestation of critical limb ischemia.  However, Ms. Mcneary has declined angiography/intervention in the past.  I have asked her to continue with diligent wound care.  We will readdress further work-up of her PAD at follow-up.  Hyperlipidemia: LDL borderline elevated at 76 on last check a year ago.  Ms. Smucker would benefit from statin therapy given her PAD, though she has been reluctant in the past given her age and other comorbidities.  We will readdress this at follow-up.  Follow-up: Return to clinic in 2 weeks.  [name], MD 10/03/2020 6:59 AM

## 2020-10-03 ENCOUNTER — Encounter: Payer: Self-pay | Admitting: Internal Medicine

## 2020-10-03 DIAGNOSIS — E785 Hyperlipidemia, unspecified: Secondary | ICD-10-CM | POA: Insufficient documentation

## 2020-10-03 DIAGNOSIS — R600 Localized edema: Secondary | ICD-10-CM | POA: Insufficient documentation

## 2020-10-15 DIAGNOSIS — I83019 Varicose veins of right lower extremity with ulcer of unspecified site: Secondary | ICD-10-CM | POA: Diagnosis not present

## 2020-10-15 DIAGNOSIS — L97929 Non-pressure chronic ulcer of unspecified part of left lower leg with unspecified severity: Secondary | ICD-10-CM | POA: Diagnosis not present

## 2020-10-15 DIAGNOSIS — L97919 Non-pressure chronic ulcer of unspecified part of right lower leg with unspecified severity: Secondary | ICD-10-CM | POA: Diagnosis not present

## 2020-10-15 DIAGNOSIS — N390 Urinary tract infection, site not specified: Secondary | ICD-10-CM | POA: Diagnosis not present

## 2020-10-15 DIAGNOSIS — I83029 Varicose veins of left lower extremity with ulcer of unspecified site: Secondary | ICD-10-CM | POA: Diagnosis not present

## 2020-10-17 DIAGNOSIS — I83019 Varicose veins of right lower extremity with ulcer of unspecified site: Secondary | ICD-10-CM | POA: Diagnosis not present

## 2020-10-17 DIAGNOSIS — L97919 Non-pressure chronic ulcer of unspecified part of right lower leg with unspecified severity: Secondary | ICD-10-CM | POA: Diagnosis not present

## 2020-10-17 DIAGNOSIS — I739 Peripheral vascular disease, unspecified: Secondary | ICD-10-CM | POA: Diagnosis not present

## 2020-10-17 DIAGNOSIS — I83029 Varicose veins of left lower extremity with ulcer of unspecified site: Secondary | ICD-10-CM | POA: Diagnosis not present

## 2020-10-17 DIAGNOSIS — L97929 Non-pressure chronic ulcer of unspecified part of left lower leg with unspecified severity: Secondary | ICD-10-CM | POA: Diagnosis not present

## 2020-10-19 DIAGNOSIS — L97919 Non-pressure chronic ulcer of unspecified part of right lower leg with unspecified severity: Secondary | ICD-10-CM | POA: Diagnosis not present

## 2020-10-19 DIAGNOSIS — I83019 Varicose veins of right lower extremity with ulcer of unspecified site: Secondary | ICD-10-CM | POA: Diagnosis not present

## 2020-10-19 DIAGNOSIS — L97929 Non-pressure chronic ulcer of unspecified part of left lower leg with unspecified severity: Secondary | ICD-10-CM | POA: Diagnosis not present

## 2020-10-20 NOTE — Progress Notes (Deleted)
Cardiology Office Note  Date:  10/20/2020   ID:  Kristen Graham, DOB 07-01-1929, MRN [id number]  PCP:  Philmore Pali, NP  Cardiologist:  Dr. Saunders Revel  _____________  2 weeks f/u  _____________   History of Present Illness: Kristen Graham is a 86 y.o. female with pmh of persistent afib, chronic HFpEF, heart disease with at least moderate right popliteal artery stenosis, HTN, HLD, prediabetes, CKD stage 3 who presents for 2 week follow up for tachycardia. She was seen in the ED for a fall found to be in Afib RVR in Spetember 2021 in the setting of UTI. She was started on Xarelto and meroprolol. The patient was last seen 10/02/20 and metoprolol was incresed to 50 mg in the at and 37.5mg  in the evening. Xarelto 15mg  for history of falls. Due to lower leg edema lasix was increased to 40mg  daily.    Today, she denies symptoms of palpitations, chest pain, shortness of breath, orthopnea, PND, lower extremity edema, claudication, dizziness, presyncope, syncope, bleeding, or neurologic sequela. The patient is tolerating medications without difficulties and is otherwise without complaint today.    _____________   Past Medical History:  Diagnosis Date  . Atrial fibrillation ([place]) 2020  . Glaucoma   . Hyperlipidemia   . Hypertension   . Left wrist fracture   . Prediabetes    Past Surgical History:  Procedure Laterality Date  . CATARACT EXTRACTION    . I & D EXTREMITY Left 04/06/2015   Procedure: IRRIGATION AND DEBRIDEMENT left forearm;  Surgeon: Hessie Knows, MD;  Location: [facility] ORS;  Service: Orthopedics;  Laterality: Left;  . I&D leg    . ORIF RADIAL FRACTURE Left 04/06/2015   Procedure: OPEN REDUCTION INTERNAL FIXATION (ORIF) RADIAL FRACTURE;  Surgeon: Hessie Knows, MD;  Location: [facility] ORS;  Service: Orthopedics;  Laterality: Left;   _____________  Current Outpatient Medications  Medication Sig Dispense Refill  . ALPRAZolam (XANAX) 0.5 MG tablet Take 0.5 mg by mouth 2 (two) times daily as  needed.    . furosemide (LASIX) 40 MG tablet Take 1 tablet (40 mg total) by mouth 2 (two) times daily. 180 tablet 0  . metoprolol succinate (TOPROL-XL) 25 MG 24 hr tablet Take 50 mg (2 tablets) by mouth every morning and take 37.5 mg (1.5 tablets) by mouth every evening. 315 tablet 0  . oxyCODONE (OXY IR/ROXICODONE) 5 MG immediate release tablet Take 1-2 tablets (5-10 mg total) by mouth every 4 (four) hours as needed for moderate pain. 30 tablet 0  . potassium chloride SA (KLOR-CON) 20 MEQ tablet Take 2 tablets (40 mEq total) by mouth daily. 180 tablet 1  . Rivaroxaban (XARELTO) 15 MG TABS tablet TAKE 1 TABLET BY MOUTH EVERY DAY WITH SUPPER 30 tablet 2   No current facility-administered medications for this visit.   _____________   Allergies:   Other and Sulfa antibiotics  _____________   Social History:  The patient  reports that she has never smoked. She has never used smokeless tobacco. She reports that she does not drink alcohol and does not use drugs.  _____________   Family History:  The patient's family history includes Arrhythmia in her son; Stroke in her mother.  _____________   ROS:  Please see the history of present illness.   Positive for ***,   All other systems are reviewed and negative.  _____________   PHYSICAL EXAM: VS:  There were no vitals taken for this visit. , BMI There is no height  or weight on file to calculate BMI. GEN: Well nourished, well developed, in no acute distress  HEENT: normal  Neck: no JVD, carotid bruits, or masses Cardiac: RRR; no murmurs, rubs, or gallops. No clubbing, cyanosis, edema.  Radials/DP/PT 2+ and equal bilaterally.  Respiratory:  clear to auscultation bilaterally, normal work of breathing GI: soft, nontender, nondistended, + BS MS: no deformity or atrophy  Skin: warm and dry, no rash Neuro:  Strength and sensation are intact Psych: euthymic mood, full affect _____________  EKG:   The ekg ordered today shows ***  Recent  Labs: 09/30/2020: BUN 26; Creatinine, Ser 1.24; Hemoglobin 14.6; Platelets 251; Potassium 3.1; Sodium 133  11/07/2019: Cholesterol 132; HDL 42; LDL Cholesterol 76; Total CHOL/HDL Ratio 3.1; Triglycerides 71; VLDL 14  CrCl cannot be calculated (Unknown ideal weight.).  Wt Readings from Last 3 Encounters:  09/30/20 150 lb (68 kg)  08/14/20 139 lb 6 oz (63.2 kg)  05/06/20 144 lb 8 oz (65.5 kg)    Relevant Studies: Echo 10/202  1. Left ventricular ejection fraction, by visual estimation, is 55 to  60%. The left ventricle has normal function. There is no left ventricular  hypertrophy.  2. Left ventricular diastolic Doppler parameters are indeterminate  pattern of LV diastolic filling.  3. There is moderate focal thickening of the basal septum.  4. Global right ventricle has normal systolic function.The right  ventricular size is mildly enlarged. No increase in right ventricular wall  thickness.  5. Left atrial size was mild-moderately dilated.  6. Right atrial size was mildly dilated.  7. Mild mitral annular calcification.  8. The mitral valve is degenerative. Moderate mitral valve regurgitation.  9. The tricuspid valve is grossly normal. Tricuspid valve regurgitation  severe.  10. The aortic valve is tricuspid Aortic valve regurgitation is mild by  color flow Doppler. Mild to moderate aortic valve sclerosis/calcification  without any evidence of aortic stenosis.  11. The pulmonic valve was not well visualized. Pulmonic valve  regurgitation is mild by color flow Doppler.  12. Mildly elevated pulmonary artery systolic pressure.  13. The inferior vena cava is dilated in size with <50% respiratory  variability, suggesting right atrial pressure of 15 mmHg.  14. The interatrial septum was not well visualized.  _____________   ASSESSMENT AND PLAN:  Permanent Afib Was in Afib RVR in the setting of UTI on metoprolol 50mg  in the am and 37.5mg  in the pm. Xarelto 15mg  for  anticoagulation.   Leg edema and chronic HFpEF susected multifactorial frm chornic chf and venous stasis. Elevation encouraged. Lasix increased to 40 mg daily  PAD Has declined angiography in the past. Wounds and follows with wound care.   HLD LDL at 76. Reluctant in the past to start statin due to comorbidites and age. Start statin  Disposition:   FU with ***   Signed, Yula Crotwell Ninfa Meeker, PA-C 10/20/2020 9:15 PM    _____________ [facility] [address] [place] [address]  [phone] (office) [phone] (fax)

## 2020-10-21 ENCOUNTER — Telehealth: Payer: Self-pay | Admitting: Internal Medicine

## 2020-10-21 ENCOUNTER — Ambulatory Visit: Payer: Medicare Other | Admitting: Medical

## 2020-10-21 DIAGNOSIS — N183 Chronic kidney disease, stage 3 unspecified: Secondary | ICD-10-CM | POA: Diagnosis not present

## 2020-10-21 DIAGNOSIS — Z6822 Body mass index (BMI) 22.0-22.9, adult: Secondary | ICD-10-CM | POA: Diagnosis not present

## 2020-10-21 DIAGNOSIS — I4821 Permanent atrial fibrillation: Secondary | ICD-10-CM

## 2020-10-21 DIAGNOSIS — Z87440 Personal history of urinary (tract) infections: Secondary | ICD-10-CM | POA: Diagnosis not present

## 2020-10-21 DIAGNOSIS — Z7901 Long term (current) use of anticoagulants: Secondary | ICD-10-CM | POA: Diagnosis not present

## 2020-10-21 DIAGNOSIS — R7303 Prediabetes: Secondary | ICD-10-CM | POA: Diagnosis not present

## 2020-10-21 DIAGNOSIS — I5032 Chronic diastolic (congestive) heart failure: Secondary | ICD-10-CM

## 2020-10-21 DIAGNOSIS — I739 Peripheral vascular disease, unspecified: Secondary | ICD-10-CM

## 2020-10-21 DIAGNOSIS — I4819 Other persistent atrial fibrillation: Secondary | ICD-10-CM | POA: Diagnosis not present

## 2020-10-21 DIAGNOSIS — L97819 Non-pressure chronic ulcer of other part of right lower leg with unspecified severity: Secondary | ICD-10-CM | POA: Diagnosis not present

## 2020-10-21 DIAGNOSIS — I5030 Unspecified diastolic (congestive) heart failure: Secondary | ICD-10-CM | POA: Diagnosis not present

## 2020-10-21 DIAGNOSIS — R350 Frequency of micturition: Secondary | ICD-10-CM | POA: Diagnosis not present

## 2020-10-21 DIAGNOSIS — G4700 Insomnia, unspecified: Secondary | ICD-10-CM | POA: Diagnosis not present

## 2020-10-21 DIAGNOSIS — Z993 Dependence on wheelchair: Secondary | ICD-10-CM | POA: Diagnosis not present

## 2020-10-21 DIAGNOSIS — I70238 Atherosclerosis of native arteries of right leg with ulceration of other part of lower right leg: Secondary | ICD-10-CM | POA: Diagnosis not present

## 2020-10-21 DIAGNOSIS — E785 Hyperlipidemia, unspecified: Secondary | ICD-10-CM | POA: Diagnosis not present

## 2020-10-21 DIAGNOSIS — I70248 Atherosclerosis of native arteries of left leg with ulceration of other part of lower left leg: Secondary | ICD-10-CM | POA: Diagnosis not present

## 2020-10-21 DIAGNOSIS — L97829 Non-pressure chronic ulcer of other part of left lower leg with unspecified severity: Secondary | ICD-10-CM | POA: Diagnosis not present

## 2020-10-21 DIAGNOSIS — Z4800 Encounter for change or removal of nonsurgical wound dressing: Secondary | ICD-10-CM | POA: Diagnosis not present

## 2020-10-21 NOTE — Telephone Encounter (Signed)
Pt c/o BP issue: STAT if pt c/o blurred vision, one-sided weakness or slurred speech  1. What are your last 5 BP readings? 98/48  2. Are you having any other symptoms (ex. Dizziness, headache, blurred vision, passed out)? none  3. What is your BP issue? Low BP before taking medication

## 2020-10-21 NOTE — Telephone Encounter (Signed)
Spoke with patient's son. He verbalized understanding and will talk to his mom about what Dr End said. He says her legs have gone down some.  Home health is wrapping them very well but they have not gone down to her baseline yet. He will continue the furosemide twice a day for now and see what Dr End thinks at appointment on Wednesday. Home Health nurse also encouraged patient to drink water and patient has been doing that very well this afternoon.

## 2020-10-21 NOTE — Telephone Encounter (Signed)
As long as [name] is asymptomatic, I would not hold any further doses of metoprolol.  It is okay to decrease furosemide to 40 mg daily if leg swelling is back to baseline.  If Ms. Kristen Graham becomes symptomatic (lightheaded, worsening fatigue, confusion), her family should contact us right away or take her to the nearest medical facility for further evaluation.  [name], MD [facility]

## 2020-10-21 NOTE — Telephone Encounter (Signed)
Spoke with patient's son. States the home health nurse came to the house this morning and patient's BP was 98/48, HR between 105-120. Patient denies dizziness, lightheadedness or any other symptoms.  Home health nurse told son to hold the morning metoprolol this morning and patient increase intake of water.  They do not have a BP cuff at home. Son is going to get one this afternoon. I encouraged him to get one so that he can continue checking the BP and HR and advised patient does need some metoprolol to help with her HR.  Advised I will route to Dr End for advice and any further parameters.

## 2020-10-23 ENCOUNTER — Other Ambulatory Visit: Payer: Self-pay

## 2020-10-23 ENCOUNTER — Ambulatory Visit: Payer: Medicare Other | Admitting: Internal Medicine

## 2020-10-23 ENCOUNTER — Encounter: Payer: Self-pay | Admitting: Internal Medicine

## 2020-10-23 VITALS — BP 88/56 | HR 87 | Ht 61.0 in

## 2020-10-23 DIAGNOSIS — I4821 Permanent atrial fibrillation: Secondary | ICD-10-CM

## 2020-10-23 DIAGNOSIS — Z79899 Other long term (current) drug therapy: Secondary | ICD-10-CM | POA: Diagnosis not present

## 2020-10-23 DIAGNOSIS — I70248 Atherosclerosis of native arteries of left leg with ulceration of other part of lower left leg: Secondary | ICD-10-CM | POA: Diagnosis not present

## 2020-10-23 DIAGNOSIS — I70238 Atherosclerosis of native arteries of right leg with ulceration of other part of lower right leg: Secondary | ICD-10-CM | POA: Diagnosis not present

## 2020-10-23 DIAGNOSIS — N183 Chronic kidney disease, stage 3 unspecified: Secondary | ICD-10-CM | POA: Diagnosis not present

## 2020-10-23 DIAGNOSIS — L97829 Non-pressure chronic ulcer of other part of left lower leg with unspecified severity: Secondary | ICD-10-CM | POA: Diagnosis not present

## 2020-10-23 DIAGNOSIS — R350 Frequency of micturition: Secondary | ICD-10-CM | POA: Diagnosis not present

## 2020-10-23 DIAGNOSIS — L97819 Non-pressure chronic ulcer of other part of right lower leg with unspecified severity: Secondary | ICD-10-CM | POA: Diagnosis not present

## 2020-10-23 DIAGNOSIS — Z4800 Encounter for change or removal of nonsurgical wound dressing: Secondary | ICD-10-CM | POA: Diagnosis not present

## 2020-10-23 DIAGNOSIS — Z993 Dependence on wheelchair: Secondary | ICD-10-CM | POA: Diagnosis not present

## 2020-10-23 DIAGNOSIS — I5030 Unspecified diastolic (congestive) heart failure: Secondary | ICD-10-CM | POA: Diagnosis not present

## 2020-10-23 DIAGNOSIS — I739 Peripheral vascular disease, unspecified: Secondary | ICD-10-CM | POA: Diagnosis not present

## 2020-10-23 DIAGNOSIS — R4182 Altered mental status, unspecified: Secondary | ICD-10-CM

## 2020-10-23 DIAGNOSIS — I5032 Chronic diastolic (congestive) heart failure: Secondary | ICD-10-CM | POA: Diagnosis not present

## 2020-10-23 DIAGNOSIS — I4819 Other persistent atrial fibrillation: Secondary | ICD-10-CM | POA: Diagnosis not present

## 2020-10-23 DIAGNOSIS — E785 Hyperlipidemia, unspecified: Secondary | ICD-10-CM | POA: Diagnosis not present

## 2020-10-23 DIAGNOSIS — R7303 Prediabetes: Secondary | ICD-10-CM | POA: Diagnosis not present

## 2020-10-23 DIAGNOSIS — G4700 Insomnia, unspecified: Secondary | ICD-10-CM | POA: Diagnosis not present

## 2020-10-23 DIAGNOSIS — Z6822 Body mass index (BMI) 22.0-22.9, adult: Secondary | ICD-10-CM | POA: Diagnosis not present

## 2020-10-23 DIAGNOSIS — Z87440 Personal history of urinary (tract) infections: Secondary | ICD-10-CM | POA: Diagnosis not present

## 2020-10-23 DIAGNOSIS — Z7901 Long term (current) use of anticoagulants: Secondary | ICD-10-CM | POA: Diagnosis not present

## 2020-10-23 MED ORDER — FUROSEMIDE 40 MG PO TABS: 40.0000 mg | ORAL_TABLET | Freq: Every day | ORAL | 0 refills | Status: DC

## 2020-10-23 MED ORDER — METOPROLOL SUCCINATE ER 25 MG PO TB24: 37.5000 mg | ORAL_TABLET | Freq: Two times a day (BID) | ORAL | 3 refills | Status: DC

## 2020-10-23 MED ORDER — POTASSIUM CHLORIDE CRYS ER 20 MEQ PO TBCR: 20.0000 meq | EXTENDED_RELEASE_TABLET | Freq: Every day | ORAL | 1 refills | Status: AC

## 2020-10-23 NOTE — Progress Notes (Signed)
Follow-up Outpatient Visit Date: 10/23/2020  Primary Care Provider: Philmore Pali, NP [place] [id number]  Chief Complaint: Follow-up atrial fibrillation, HFpEF, and low blood pressures  HPI:  Kristen Graham is a 86 y.o. female with history of persistent atrial fibrillation, chronic HFpEF, heart disease with at least moderate right popliteal artery stenosis, hypertension, hyperlipidemia, prediabetes, and chronic kidney disease stage III, who presents for follow-up of atrial fibrillation, HFpEF, and PAD.  I last saw Kristen Graham in mid November following preceding ED visit for confusion.  She was noted to be tachycardic with evidence of UTI.  Kristen Graham reported feeling well, though Kristen Graham son was concerned about lethargy and difficulty getting around at home.  She remained in atrial fibrillation with borderline elevated ventricular rates, prompting [place] to increase metoprolol succinate to 50 mg QAM and 37.5 mg QPM.  Furosemide was also increased to 40 mg twice daily due to increased leg edema.  Kristen Graham son reached out to [place] 2 days ago after home health RN found Kristen Graham BP to be 98/48 with heart rates of 105-120 bpm.  I recommended discontinuation of furosemide and continuation of metoprolol as discussed at our last visit. Despite this, Kristen Graham has continued to take furosemide 40 mg twice daily.  Overall, she seems to be doing better today per Kristen Graham and Kristen Graham son's reports. Leg swelling has improved. She is being seen by home health wound care nurse, who is wrapping Kristen Graham calves. She still has some swelling in Kristen Graham thighs. She denies chest pain, shortness of breath, palpitations, and lightheadedness. Kristen Graham son is concerned that Kristen Graham mother recently had an episode of lethargy. He thinks that she may have taken alprazolam (potentially excessive doses) leading up to this episode. He asked about stopping this  medication.  --------------------------------------------------------------------------------------------------  Cardiovascular History & Procedures: Cardiovascular Problems:  Atrial fibrillation  HFpEF  Mitral, tricuspid, and aortic regurgitation  Risk Factors:  Hypertension, hyperlipidemia, prediabetes, and age > 63  Cath/PCI:  None  CV Surgery:  None  EP Procedures and Devices:  None  Non-Invasive Evaluation(s):  Bilateral lower extremity arterial Dopplers (11/03/2019): Unable to obtain accurate ABI/TBI's. Doppler examination indicates moderate (50 to 74%) right popliteal artery stenosis. Occlusion to high-grade stenosis in mid to distal right posterior tibial artery noted.  TTE (09/12/2019): Normal LV size. LVEF 55-60% with moderate focal thickening of the basal septum. Normal RV systolic function with mild dilation. Mild to moderate left atrial enlargement. Mild right atrial enlargement. Moderate mitral and severe tricuspid regurgitation. Mild aortic regurgitation. Elevated central venous pressure. Mild pulmonary hypertension.  Recent CV Pertinent Labs: Lab Results  Component Value Date   CHOL 132 11/07/2019   HDL 42 11/07/2019   LDLCALC 76 11/07/2019   TRIG 71 11/07/2019   CHOLHDL 3.1 11/07/2019   K 3.9 10/23/2020   BUN 29 (H) 10/23/2020   CREATININE 1.28 (H) 10/23/2020    Past medical and surgical history were reviewed and updated in EPIC.  Current Meds  Medication Sig  . ALPRAZolam (XANAX) 0.5 MG tablet Take 0.5 mg by mouth 2 (two) times daily as needed.  . furosemide (LASIX) 40 MG tablet Take 1 tablet (40 mg total) by mouth daily.  . metoprolol succinate (TOPROL-XL) 25 MG 24 hr tablet Take 1.5 tablets (37.5 mg total) by mouth in the morning and at bedtime.  Marland Kitchen oxyCODONE (OXY IR/ROXICODONE) 5 MG immediate release tablet Take 1-2 tablets (5-10 mg total) by mouth every 4 (four) hours as needed for moderate  pain.  . potassium chloride SA  (KLOR-CON) 20 MEQ tablet Take 1 tablet (20 mEq total) by mouth daily.  . Rivaroxaban (XARELTO) 15 MG TABS tablet TAKE 1 TABLET BY MOUTH EVERY DAY WITH SUPPER  . [DISCONTINUED] furosemide (LASIX) 40 MG tablet Take 1 tablet (40 mg total) by mouth 2 (two) times daily.  . [DISCONTINUED] metoprolol succinate (TOPROL-XL) 25 MG 24 hr tablet Take 50 mg (2 tablets) by mouth every morning and take 37.5 mg (1.5 tablets) by mouth every evening.  . [DISCONTINUED] potassium chloride SA (KLOR-CON) 20 MEQ tablet Take 2 tablets (40 mEq total) by mouth daily.    Allergies: Other and Sulfa antibiotics  Social History   Tobacco Use  . Smoking status: Never Smoker  . Smokeless tobacco: Never Used  Vaping Use  . Vaping Use: Never used  Substance Use Topics  . Alcohol use: No  . Drug use: Never    Family History  Problem Relation Age of Onset  . Stroke Mother   . Arrhythmia Son     Review of Systems: A 12-system review of systems was performed and was negative except as noted in the HPI.  --------------------------------------------------------------------------------------------------  Physical Exam: BP (!) 88/56 (BP Location: Right Arm, Patient Position: Sitting, Cuff Size: Normal)   Pulse 87   Ht 5\' 1"  (1.549 m)   BMI 28.34 kg/m   General: NAD. Seated in a wheelchair and accompanied by Kristen Graham son. Neck: No JVD or HJR. Lungs: Coarse breath sounds without wheezes or crackles. Heart: Irregularly irregular without murmurs, rubs, or gallops. Abdomen: Soft, nontender, nondistended. Extremities: Both calves wrapped. There is 2+ pitting edema in the distal thighs.  EKG: Atrial fibrillation (ventricular rate 87 bpm) with left axis deviation and poor R wave progression.  Lab Results  Component Value Date   WBC 9.6 10/23/2020   HGB 14.7 10/23/2020   HCT 44.7 10/23/2020   MCV 94 10/23/2020   PLT 225 10/23/2020    Lab Results  Component Value Date   NA 134 10/23/2020   K 3.9 10/23/2020   CL  97 10/23/2020   CO2 25 10/23/2020   BUN 29 (H) 10/23/2020   CREATININE 1.28 (H) 10/23/2020   GLUCOSE 81 10/23/2020   ALT 17 04/06/2015    Lab Results  Component Value Date   CHOL 132 11/07/2019   HDL 42 11/07/2019   LDLCALC 76 11/07/2019   TRIG 71 11/07/2019   CHOLHDL 3.1 11/07/2019    --------------------------------------------------------------------------------------------------  ASSESSMENT AND PLAN: Chronic HFpEF: Volume status remains challenging to assess, though Kristen Graham has persistent thigh edema. Kristen Graham calf swelling has improved. Given soft blood pressure as well as reports of some intermittent diarrhea recently, I have recommended de-escalation of furosemide and potassium chloride to 40 mg daily and 20 mEq daily, respectively. Kristen Graham should continue to minimize Kristen Graham sodium intake and elevate Kristen Graham legs when possible. I will check a BMP today to ensure stable renal function.  Permanent atrial fibrillation: Ventricular rate reasonable. In the setting of mild hypotension albeit asymptomatic, I have recommended decreasing metoprolol succinate back to 37.5 mg twice daily. We will continue anticoagulation with rivaroxaban. I will check a CBC today to ensure stable hemoglobin and platelets.  PAD: No symptoms reported by Kristen Graham. She and Kristen Graham son note that wounds are improving with wound care nurse coming to Kristen Graham home. She has previously refused further work-up/intervention on Kristen Graham severe right lower extremity PAD by noninvasive studies.  Altered mental status: This continues to be an ongoing  problem for Kristen Graham and is likely multifactorial. I am concerned about Kristen Graham son's report of continued alprazolam use, potentially in higher doses than prescribed. I think any sedating medication, particularly a benzodiazepine, is not a good choice for Kristen Graham. I have instructed Kristen Graham and Kristen Graham son to avoid taking this if at all possible and to speak with Kristen Graham PCP about alternatives for  this.  Follow-up: Return to clinic in 1 month.  [name], MD 10/24/2020 10:31 AM

## 2020-10-23 NOTE — Patient Instructions (Signed)
DO NOT TAKE XANAX IF AT ALL POSSIBLE. TALK TO YOUR PRIMARY CARE ABOUT ALTERNATIVE MEDICATION.  Medication Instructions:  Your physician has recommended you make the following change in your medication:  1- DECREASE Furosemide to 40 mg by mouth once a day. 2- DECREASE Potassium to 20 mEq by mouth once a day. 3- DECREASE Metoprolol to 37.5 mg (1.5 tablets) by mouth two times a day.  *If you need a refill on your cardiac medications before your next appointment, please call your pharmacy*  Lab Work: Your physician recommends that you return for lab work in: [place], BMET. . If you have labs (blood work) drawn today and your tests are completely normal, you will receive your results only by: Marland Kitchen MyChart Message (if you have MyChart) OR . A paper copy in the mail If you have any lab test that is abnormal or we need to change your treatment, we will call you to review the results.  Testing/Procedures: none  Follow-Up: At [facility], you and your health needs are our priority.  As part of our continuing mission to provide you with exceptional heart care, we have created designated Provider Care Teams.  These Care Teams include your primary Cardiologist (physician) and Advanced Practice Providers (APPs -  Physician Assistants and Nurse Practitioners) who all work together to provide you with the care you need, when you need it.  We recommend signing up for the patient portal called "MyChart".  Sign up information is provided on this After Visit Summary.  MyChart is used to connect with patients for Virtual Visits (Telemedicine).  Patients are able to view lab/test results, encounter notes, upcoming appointments, etc.  Non-urgent messages can be sent to your provider as well.   To learn more about what you can do with MyChart, go to [email or web address].    Your next appointment:   1 month(s)  The format for your next appointment:   In Person  Provider:   You may see [name], MD or one of the following Advanced Practice Providers on your designated Care Team:    Efferson Hodgkins, NP  Christell Faith, PA-C  Marrianne Mood, PA-C  Cadence Gal[place], [place]  Laurann Montana, NP

## 2020-10-24 DIAGNOSIS — R4182 Altered mental status, unspecified: Secondary | ICD-10-CM | POA: Insufficient documentation

## 2020-10-24 LAB — CBC
Hematocrit: 44.7 % (ref 34.0–46.6)
Hemoglobin: 14.7 g/dL (ref 11.1–15.9)
MCH: 31 pg (ref 26.6–33.0)
MCHC: 32.9 g/dL (ref 31.5–35.7)
MCV: 94 fL (ref 79–97)
Platelets: 225 10*3/uL (ref 150–450)
RBC: 4.74 x10E6/uL (ref 3.77–5.28)
RDW: 14.5 % (ref 11.7–15.4)
WBC: 9.6 10*3/uL (ref 3.4–10.8)

## 2020-10-24 LAB — BASIC METABOLIC PANEL
BUN/Creatinine Ratio: 23 (ref 12–28)
BUN: 29 mg/dL — ABNORMAL HIGH (ref 8–27)
CO2: 25 mmol/L (ref 20–29)
Calcium: 8.8 mg/dL (ref 8.7–10.3)
Chloride: 97 mmol/L (ref 96–106)
Creatinine, Ser: 1.28 mg/dL — ABNORMAL HIGH (ref 0.57–1.00)
GFR calc Af Amer: 43 mL/min/{1.73_m2} — ABNORMAL LOW (ref >59)
GFR calc non Af Amer: 37 mL/min/{1.73_m2} — ABNORMAL LOW (ref >59)
Glucose: 81 mg/dL (ref 65–99)
Potassium: 3.9 mmol/L (ref 3.5–5.2)
Sodium: 134 mmol/L (ref 134–144)

## 2020-10-25 DIAGNOSIS — L97221 Non-pressure chronic ulcer of left calf limited to breakdown of skin: Secondary | ICD-10-CM | POA: Diagnosis not present

## 2020-10-25 DIAGNOSIS — L97211 Non-pressure chronic ulcer of right calf limited to breakdown of skin: Secondary | ICD-10-CM | POA: Diagnosis not present

## 2020-10-27 ENCOUNTER — Other Ambulatory Visit: Payer: Self-pay | Admitting: Internal Medicine

## 2020-10-27 DIAGNOSIS — L97221 Non-pressure chronic ulcer of left calf limited to breakdown of skin: Secondary | ICD-10-CM | POA: Diagnosis not present

## 2020-10-27 DIAGNOSIS — L97211 Non-pressure chronic ulcer of right calf limited to breakdown of skin: Secondary | ICD-10-CM | POA: Diagnosis not present

## 2020-10-28 DIAGNOSIS — R7303 Prediabetes: Secondary | ICD-10-CM | POA: Diagnosis not present

## 2020-10-28 DIAGNOSIS — I4819 Other persistent atrial fibrillation: Secondary | ICD-10-CM | POA: Diagnosis not present

## 2020-10-28 DIAGNOSIS — L97819 Non-pressure chronic ulcer of other part of right lower leg with unspecified severity: Secondary | ICD-10-CM | POA: Diagnosis not present

## 2020-10-28 DIAGNOSIS — N183 Chronic kidney disease, stage 3 unspecified: Secondary | ICD-10-CM | POA: Diagnosis not present

## 2020-10-28 DIAGNOSIS — E785 Hyperlipidemia, unspecified: Secondary | ICD-10-CM | POA: Diagnosis not present

## 2020-10-28 DIAGNOSIS — Z993 Dependence on wheelchair: Secondary | ICD-10-CM | POA: Diagnosis not present

## 2020-10-28 DIAGNOSIS — I70248 Atherosclerosis of native arteries of left leg with ulceration of other part of lower left leg: Secondary | ICD-10-CM | POA: Diagnosis not present

## 2020-10-28 DIAGNOSIS — G4700 Insomnia, unspecified: Secondary | ICD-10-CM | POA: Diagnosis not present

## 2020-10-28 DIAGNOSIS — I5030 Unspecified diastolic (congestive) heart failure: Secondary | ICD-10-CM | POA: Diagnosis not present

## 2020-10-28 DIAGNOSIS — L97829 Non-pressure chronic ulcer of other part of left lower leg with unspecified severity: Secondary | ICD-10-CM | POA: Diagnosis not present

## 2020-10-28 DIAGNOSIS — Z7901 Long term (current) use of anticoagulants: Secondary | ICD-10-CM | POA: Diagnosis not present

## 2020-10-28 DIAGNOSIS — Z87440 Personal history of urinary (tract) infections: Secondary | ICD-10-CM | POA: Diagnosis not present

## 2020-10-28 DIAGNOSIS — I70238 Atherosclerosis of native arteries of right leg with ulceration of other part of lower right leg: Secondary | ICD-10-CM | POA: Diagnosis not present

## 2020-10-28 DIAGNOSIS — R350 Frequency of micturition: Secondary | ICD-10-CM | POA: Diagnosis not present

## 2020-10-28 DIAGNOSIS — Z4800 Encounter for change or removal of nonsurgical wound dressing: Secondary | ICD-10-CM | POA: Diagnosis not present

## 2020-10-28 DIAGNOSIS — Z6822 Body mass index (BMI) 22.0-22.9, adult: Secondary | ICD-10-CM | POA: Diagnosis not present

## 2020-10-29 ENCOUNTER — Encounter: Payer: Self-pay | Admitting: Emergency Medicine

## 2020-10-29 ENCOUNTER — Emergency Department
Admission: EM | Admit: 2020-10-29 | Discharge: 2020-10-29 | Disposition: A | Discharge status: Home / Self Care | Payer: Medicare Other | Attending: Emergency Medicine | Admitting: Emergency Medicine

## 2020-10-29 ENCOUNTER — Emergency Department: Payer: Medicare Other

## 2020-10-29 ENCOUNTER — Other Ambulatory Visit: Payer: Self-pay

## 2020-10-29 DIAGNOSIS — Y999 Unspecified external cause status: Secondary | ICD-10-CM | POA: Insufficient documentation

## 2020-10-29 DIAGNOSIS — Y9389 Activity, other specified: Secondary | ICD-10-CM | POA: Diagnosis not present

## 2020-10-29 DIAGNOSIS — S199XXA Unspecified injury of neck, initial encounter: Secondary | ICD-10-CM | POA: Diagnosis not present

## 2020-10-29 DIAGNOSIS — I4891 Unspecified atrial fibrillation: Secondary | ICD-10-CM | POA: Diagnosis not present

## 2020-10-29 DIAGNOSIS — M47812 Spondylosis without myelopathy or radiculopathy, cervical region: Secondary | ICD-10-CM | POA: Diagnosis not present

## 2020-10-29 DIAGNOSIS — I110 Hypertensive heart disease with heart failure: Secondary | ICD-10-CM | POA: Diagnosis not present

## 2020-10-29 DIAGNOSIS — Z7901 Long term (current) use of anticoagulants: Secondary | ICD-10-CM | POA: Insufficient documentation

## 2020-10-29 DIAGNOSIS — S0101XA Laceration without foreign body of scalp, initial encounter: Secondary | ICD-10-CM | POA: Insufficient documentation

## 2020-10-29 DIAGNOSIS — S0003XA Contusion of scalp, initial encounter: Secondary | ICD-10-CM | POA: Diagnosis not present

## 2020-10-29 DIAGNOSIS — G319 Degenerative disease of nervous system, unspecified: Secondary | ICD-10-CM | POA: Diagnosis not present

## 2020-10-29 DIAGNOSIS — W19XXXA Unspecified fall, initial encounter: Secondary | ICD-10-CM

## 2020-10-29 DIAGNOSIS — Z23 Encounter for immunization: Secondary | ICD-10-CM | POA: Insufficient documentation

## 2020-10-29 DIAGNOSIS — W010XXA Fall on same level from slipping, tripping and stumbling without subsequent striking against object, initial encounter: Secondary | ICD-10-CM | POA: Diagnosis not present

## 2020-10-29 DIAGNOSIS — Z79899 Other long term (current) drug therapy: Secondary | ICD-10-CM | POA: Insufficient documentation

## 2020-10-29 DIAGNOSIS — Y92038 Other place in apartment as the place of occurrence of the external cause: Secondary | ICD-10-CM | POA: Insufficient documentation

## 2020-10-29 DIAGNOSIS — I509 Heart failure, unspecified: Secondary | ICD-10-CM | POA: Insufficient documentation

## 2020-10-29 DIAGNOSIS — R9082 White matter disease, unspecified: Secondary | ICD-10-CM | POA: Diagnosis not present

## 2020-10-29 MED ORDER — TETANUS-DIPHTH-ACELL PERTUSSIS 5-2.5-18.5 LF-MCG/0.5 IM SUSY
0.5000 mL | PREFILLED_SYRINGE | Freq: Once | INTRAMUSCULAR | Status: AC
  Administered 2020-10-29: 16:00:00 0.5 mL via INTRAMUSCULAR
  Filled 2020-10-29: qty 0.5

## 2020-10-29 NOTE — ED Triage Notes (Signed)
Fall,  Hit back of head.  Small laceration to head, bleeding controlled.  AAOx3. Skin warm and dry. NAD

## 2020-10-29 NOTE — ED Provider Notes (Signed)
[facility] Emergency Department Provider Note  ____________________________________________  Time seen: Approximately 3:48 PM  I have reviewed the triage vital signs and the nursing notes.   HISTORY  Chief Complaint Fall    HPI Kristen Graham is a 86 y.o. female who presents the emergency department secondary to a mechanical fall.  Patient was in her apartment, tripped over a rug and fell striking the back of her head.  She staying a small laceration in the occipital skull region.  Patient has no other injuries or complaints.  Daughter states that she is at her baseline.  Patient denies any loss of consciousness.  Patient is on a blood thinner.  Unsure of last tetanus shot.   Patient denies any headache, visual changes, neck pain, radicular symptoms in the upper or lower extremity.  No back pain, hip pain or upper extremity pain.        Past Medical History:  Diagnosis Date  . Atrial fibrillation ([place]) 2020  . Glaucoma   . Hyperlipidemia   . Hypertension   . Left wrist fracture   . Prediabetes     Patient Active Problem List   Diagnosis Date Noted  . Altered mental status 10/24/2020  . Leg edema 10/03/2020  . Hyperlipidemia LDL goal <70 10/03/2020  . Permanent atrial fibrillation ([place]) 08/15/2020  . Hematuria 05/06/2020  . PAD (peripheral artery disease) ([place]) 12/21/2019  . Chronic heart failure with preserved ejection fraction (HFpEF) ([place]) 09/14/2019  . Persistent atrial fibrillation ([place]) 08/29/2019  . Leg swelling 08/29/2019  . Skin ulcer of right calf, limited to breakdown of skin ([place]) 08/29/2019  . Essential hypertension 08/29/2019  . Open forearm fracture 04/06/2015    Past Surgical History:  Procedure Laterality Date  . CATARACT EXTRACTION    . I & D EXTREMITY Left 04/06/2015   Procedure: IRRIGATION AND DEBRIDEMENT left forearm;  Surgeon: Hessie Knows, MD;  Location: [facility] ORS;  Service: Orthopedics;  Laterality: Left;  . I&D leg     . ORIF RADIAL FRACTURE Left 04/06/2015   Procedure: OPEN REDUCTION INTERNAL FIXATION (ORIF) RADIAL FRACTURE;  Surgeon: Hessie Knows, MD;  Location: [facility] ORS;  Service: Orthopedics;  Laterality: Left;    Prior to Admission medications   Medication Sig Start Date End Date Taking? Authorizing Provider  ALPRAZolam Duanne Moron) 0.5 MG tablet Take 0.5 mg by mouth 2 (two) times daily as needed. 07/01/20   [provider]  furosemide (LASIX) 40 MG tablet Take 1 tablet (40 mg total) by mouth daily. 10/23/20 01/21/21  End, Harrell Gave, MD  metoprolol succinate (TOPROL-XL) 25 MG 24 hr tablet Take 1.5 tablets (37.5 mg total) by mouth in the morning and at bedtime. 10/23/20   End, Harrell Gave, MD  oxyCODONE (OXY IR/ROXICODONE) 5 MG immediate release tablet Take 1-2 tablets (5-10 mg total) by mouth every 4 (four) hours as needed for moderate pain. 04/08/15   Reche Dixon, PA-C  potassium chloride SA (KLOR-CON) 20 MEQ tablet Take 1 tablet (20 mEq total) by mouth daily. 10/23/20   End, Harrell Gave, MD  Rivaroxaban (XARELTO) 15 MG TABS tablet TAKE 1 TABLET BY MOUTH EVERY DAY WITH SUPPER 05/06/20   End, Harrell Gave, MD    Allergies Other and Sulfa antibiotics  Family History  Problem Relation Age of Onset  . Stroke Mother   . Arrhythmia Son     Social History Social History   Tobacco Use  . Smoking status: Never Smoker  . Smokeless tobacco: Never Used  Vaping Use  . Vaping  Use: Never used  Substance Use Topics  . Alcohol use: No  . Drug use: Never     Review of Systems  Constitutional: No fever/chills Eyes: No visual changes. No discharge ENT: No upper respiratory complaints. Cardiovascular: no chest pain. Respiratory: no cough. No SOB. Gastrointestinal: No abdominal pain.  No nausea, no vomiting.  No diarrhea.  No constipation. Musculoskeletal: Negative for musculoskeletal pain. Skin: Positive for scalp laceration Neurological: Negative for headaches, focal weakness or numbness.  10 System  ROS otherwise negative.  ____________________________________________   PHYSICAL EXAM:  VITAL SIGNS: ED Triage Vitals  Enc Vitals Group     BP 10/29/20 1312 102/83     Pulse Rate 10/29/20 1312 82     Resp 10/29/20 1312 15     Temp 10/29/20 1312 97.7 F (36.5 C)     Temp Source 10/29/20 1312 Oral     SpO2 10/29/20 1312 99 %     Weight 10/29/20 1302 149 lb 14.6 oz (68 kg)     Height 10/29/20 1302 5\' 1"  (1.549 m)     Head Circumference --      Peak Flow --      Pain Score 10/29/20 1301 0     Pain Loc --      Pain Edu? --      Excl. in [place]? --      Constitutional: Alert and oriented. Well appearing and in no acute distress. Eyes: Conjunctivae are normal. PERRL. EOMI. Head: Visualization of the scalp reveals approximately 1.5 cm laceration to the occipital skull.  Edges are minimally separated.  No active bleeding.  Patient is nontender to palpation of the osseous structures surrounding this laceration.  No battle signs, raccoon eyes, serosanguineous fluid drainage from the ears or nares. ENT:      Ears:       Nose: No congestion/rhinnorhea.      Mouth/Throat: Mucous membranes are moist.  Neck: No stridor.  No cervical spine tenderness to palpation.  Cardiovascular: Normal rate, regular rhythm. Normal S1 and S2.  Good peripheral circulation. Respiratory: Normal respiratory effort without tachypnea or retractions. Lungs CTAB. Good air entry to the bases with no decreased or absent breath sounds. Musculoskeletal: Full range of motion to all extremities. No gross deformities appreciated. Neurologic:  Normal speech and language. No gross focal neurologic deficits are appreciated.  Skin:  Skin is warm, dry and intact. No rash noted. Psychiatric: Mood and affect are normal. Speech and behavior are normal. Patient exhibits appropriate insight and judgement.   ____________________________________________   LABS (all labs ordered are listed, but only abnormal results are  displayed)  Labs Reviewed - No data to display ____________________________________________  EKG   ____________________________________________  RADIOLOGY I personally viewed and evaluated these images as part of my medical decision making, as well as reviewing the written report by the radiologist  ED Provider Interpretation: I concur with radiologist finding of no acute traumatic findings with intracranial hemorrhage or skull fracture.  CT Head Wo Contrast  Result Date: 10/29/2020 CLINICAL DATA:  [place] and hit head today. EXAM: CT HEAD WITHOUT CONTRAST CT CERVICAL SPINE WITHOUT CONTRAST TECHNIQUE: Multidetector CT imaging of the head and cervical spine was performed following the standard protocol without intravenous contrast. Multiplanar CT image reconstructions of the cervical spine were also generated. COMPARISON:  Head CT 09/30/2020 FINDINGS: CT HEAD FINDINGS Brain: Stable age related cerebral atrophy, ventriculomegaly and periventricular white matter disease. No extra-axial fluid collections are identified. No CT findings for acute hemispheric infarction  or intracranial hemorrhage. No mass lesions. The brainstem and cerebellum are normal. Vascular: Stable advanced vascular calcifications. No aneurysm or hyperdense vessels. Skull: No skull fracture or bone lesions. Sinuses/Orbits: The paranasal sinuses and mastoid air cells are clear. The globes are intact. Other: Small scalp hematoma noted at the right posterior vertex. No underlying skull fracture. CT CERVICAL SPINE FINDINGS Alignment: Normal overall alignment of the cervical vertebral bodies. Moderate degenerative cervical spondylosis with mild multilevel degenerative subluxations. Skull base and vertebrae: No acute fracture. No primary bone lesion or focal pathologic process. Soft tissues and spinal canal: No prevertebral fluid or swelling. No visible canal hematoma. Disc levels: The spinal canal is fairly generous. No large disc  protrusions or canal stenosis. Mild multilevel foraminal narrowing due to uncinate spurring and facet disease. Upper chest: The lung apices are grossly clear. Other: None IMPRESSION: 1. Stable age related cerebral atrophy, ventriculomegaly and periventricular white matter disease. 2. No acute intracranial findings or skull fracture. 3. Small scalp hematoma at the right posterior vertex without underlying skull fracture. 4. Degenerative cervical spondylosis with multilevel degenerative subluxations but no acute cervical spine fracture. Electronically Signed   By: Marijo Sanes M.D.   On: 10/29/2020 14:03   CT Cervical Spine Wo Contrast  Result Date: 10/29/2020 CLINICAL DATA:  [place] and hit head today. EXAM: CT HEAD WITHOUT CONTRAST CT CERVICAL SPINE WITHOUT CONTRAST TECHNIQUE: Multidetector CT imaging of the head and cervical spine was performed following the standard protocol without intravenous contrast. Multiplanar CT image reconstructions of the cervical spine were also generated. COMPARISON:  Head CT 09/30/2020 FINDINGS: CT HEAD FINDINGS Brain: Stable age related cerebral atrophy, ventriculomegaly and periventricular white matter disease. No extra-axial fluid collections are identified. No CT findings for acute hemispheric infarction or intracranial hemorrhage. No mass lesions. The brainstem and cerebellum are normal. Vascular: Stable advanced vascular calcifications. No aneurysm or hyperdense vessels. Skull: No skull fracture or bone lesions. Sinuses/Orbits: The paranasal sinuses and mastoid air cells are clear. The globes are intact. Other: Small scalp hematoma noted at the right posterior vertex. No underlying skull fracture. CT CERVICAL SPINE FINDINGS Alignment: Normal overall alignment of the cervical vertebral bodies. Moderate degenerative cervical spondylosis with mild multilevel degenerative subluxations. Skull base and vertebrae: No acute fracture. No primary bone lesion or focal pathologic process.  Soft tissues and spinal canal: No prevertebral fluid or swelling. No visible canal hematoma. Disc levels: The spinal canal is fairly generous. No large disc protrusions or canal stenosis. Mild multilevel foraminal narrowing due to uncinate spurring and facet disease. Upper chest: The lung apices are grossly clear. Other: None IMPRESSION: 1. Stable age related cerebral atrophy, ventriculomegaly and periventricular white matter disease. 2. No acute intracranial findings or skull fracture. 3. Small scalp hematoma at the right posterior vertex without underlying skull fracture. 4. Degenerative cervical spondylosis with multilevel degenerative subluxations but no acute cervical spine fracture. Electronically Signed   By: Marijo Sanes M.D.   On: 10/29/2020 14:03    ____________________________________________    PROCEDURES  Procedure(s) performed:    Marland KitchenMarland KitchenLaceration Repair  Date/Time: 10/29/2020 4:13 PM Performed by: Darletta Moll, PA-C Authorized by: Darletta Moll, PA-C   Consent:    Consent obtained:  Verbal   Consent given by:  Patient   Risks discussed:  Pain Universal protocol:    Procedure explained and questions answered to patient or proxy's satisfaction: yes     Patient identity confirmed:  Verbally with patient Anesthesia:    Anesthesia method:  None Laceration details:  Location:  Scalp   Scalp location:  Occipital   Length (cm):  1.5   Depth (mm):  5 Exploration:    Limited defect created (wound extended): no     Hemostasis achieved with:  Direct pressure   Wound exploration: wound explored through full range of motion and entire depth of wound visualized     Wound extent: no foreign bodies/material noted and no underlying fracture noted     Contaminated: no   Treatment:    Area cleansed with:  Saline   Amount of cleaning:  Standard   Irrigation method:  Syringe   Debridement:  None Skin repair:    Repair method:  Staples   Number of staples:   2 Approximation:    Approximation:  Close Repair type:    Repair type:  Simple Post-procedure details:    Dressing:  Open (no dressing)   Procedure completion:  Tolerated      Medications  Tdap (BOOSTRIX) injection 0.5 mL (has no administration in time range)     ____________________________________________   INITIAL IMPRESSION / ASSESSMENT AND PLAN / ED COURSE  Pertinent labs & imaging results that were available during my care of the patient were reviewed by me and considered in my medical decision making (see chart for details).  Review of the [place] CSRS was performed in accordance of [place] prior to dispensing any controlled drugs.           Patient's diagnosis is consistent with fall, scalp laceration.  Patient presented to the emergency department after mechanical fall.  She did strike her head and is on a blood thinner but had no headache or loss of consciousness.  Imaging revealed no acute traumatic findings of skull fracture or intracranial hemorrhage.  Patient had a small laceration to the occipital skull that was closed as described above.  Tetanus shot updated today.  Wound instructions are given to the patient and the patient's daughter who is present with the patient at this time.  Follow-up with primary care in 1 week for staple removal..  Patient is given ED precautions to return to the ED for any worsening or new symptoms.     ____________________________________________  FINAL CLINICAL IMPRESSION(S) / ED DIAGNOSES  Final diagnoses:  Fall, initial encounter  Laceration of scalp, initial encounter      NEW MEDICATIONS STARTED DURING THIS VISIT:  ED Discharge Orders    None          This chart was dictated using voice recognition software/Dragon. Despite best efforts to proofread, errors can occur which can change the meaning. Any change was purely unintentional.    Darletta Moll, PA-C 10/29/20 1615    Nena Polio,  MD 10/30/20 [phone]

## 2020-10-31 DIAGNOSIS — I70238 Atherosclerosis of native arteries of right leg with ulceration of other part of lower right leg: Secondary | ICD-10-CM | POA: Diagnosis not present

## 2020-10-31 DIAGNOSIS — Z993 Dependence on wheelchair: Secondary | ICD-10-CM | POA: Diagnosis not present

## 2020-10-31 DIAGNOSIS — Z7901 Long term (current) use of anticoagulants: Secondary | ICD-10-CM | POA: Diagnosis not present

## 2020-10-31 DIAGNOSIS — R7303 Prediabetes: Secondary | ICD-10-CM | POA: Diagnosis not present

## 2020-10-31 DIAGNOSIS — Z6822 Body mass index (BMI) 22.0-22.9, adult: Secondary | ICD-10-CM | POA: Diagnosis not present

## 2020-10-31 DIAGNOSIS — G4700 Insomnia, unspecified: Secondary | ICD-10-CM | POA: Diagnosis not present

## 2020-10-31 DIAGNOSIS — E785 Hyperlipidemia, unspecified: Secondary | ICD-10-CM | POA: Diagnosis not present

## 2020-10-31 DIAGNOSIS — I4819 Other persistent atrial fibrillation: Secondary | ICD-10-CM | POA: Diagnosis not present

## 2020-10-31 DIAGNOSIS — Z87440 Personal history of urinary (tract) infections: Secondary | ICD-10-CM | POA: Diagnosis not present

## 2020-10-31 DIAGNOSIS — N183 Chronic kidney disease, stage 3 unspecified: Secondary | ICD-10-CM | POA: Diagnosis not present

## 2020-10-31 DIAGNOSIS — Z4800 Encounter for change or removal of nonsurgical wound dressing: Secondary | ICD-10-CM | POA: Diagnosis not present

## 2020-10-31 DIAGNOSIS — I70248 Atherosclerosis of native arteries of left leg with ulceration of other part of lower left leg: Secondary | ICD-10-CM | POA: Diagnosis not present

## 2020-10-31 DIAGNOSIS — L97829 Non-pressure chronic ulcer of other part of left lower leg with unspecified severity: Secondary | ICD-10-CM | POA: Diagnosis not present

## 2020-10-31 DIAGNOSIS — R350 Frequency of micturition: Secondary | ICD-10-CM | POA: Diagnosis not present

## 2020-10-31 DIAGNOSIS — L97819 Non-pressure chronic ulcer of other part of right lower leg with unspecified severity: Secondary | ICD-10-CM | POA: Diagnosis not present

## 2020-10-31 DIAGNOSIS — I5030 Unspecified diastolic (congestive) heart failure: Secondary | ICD-10-CM | POA: Diagnosis not present

## 2020-11-01 ENCOUNTER — Other Ambulatory Visit: Payer: Self-pay

## 2020-11-01 ENCOUNTER — Emergency Department: Payer: Medicare Other

## 2020-11-01 ENCOUNTER — Emergency Department
Admission: EM | Admit: 2020-11-01 | Discharge: 2020-11-01 | Disposition: A | Discharge status: Home / Self Care | Payer: Medicare Other | Attending: Emergency Medicine | Admitting: Emergency Medicine

## 2020-11-01 DIAGNOSIS — Z7901 Long term (current) use of anticoagulants: Secondary | ICD-10-CM | POA: Insufficient documentation

## 2020-11-01 DIAGNOSIS — M79605 Pain in left leg: Secondary | ICD-10-CM | POA: Diagnosis not present

## 2020-11-01 DIAGNOSIS — M25559 Pain in unspecified hip: Secondary | ICD-10-CM

## 2020-11-01 DIAGNOSIS — Y92009 Unspecified place in unspecified non-institutional (private) residence as the place of occurrence of the external cause: Secondary | ICD-10-CM | POA: Insufficient documentation

## 2020-11-01 DIAGNOSIS — S3282XA Multiple fractures of pelvis without disruption of pelvic ring, initial encounter for closed fracture: Secondary | ICD-10-CM

## 2020-11-01 DIAGNOSIS — I110 Hypertensive heart disease with heart failure: Secondary | ICD-10-CM | POA: Insufficient documentation

## 2020-11-01 DIAGNOSIS — S3993XA Unspecified injury of pelvis, initial encounter: Secondary | ICD-10-CM | POA: Diagnosis present

## 2020-11-01 DIAGNOSIS — M25552 Pain in left hip: Secondary | ICD-10-CM | POA: Diagnosis not present

## 2020-11-01 DIAGNOSIS — Z79899 Other long term (current) drug therapy: Secondary | ICD-10-CM | POA: Diagnosis not present

## 2020-11-01 DIAGNOSIS — S32512A Fracture of superior rim of left pubis, initial encounter for closed fracture: Secondary | ICD-10-CM | POA: Diagnosis not present

## 2020-11-01 DIAGNOSIS — I5022 Chronic systolic (congestive) heart failure: Secondary | ICD-10-CM | POA: Diagnosis not present

## 2020-11-01 DIAGNOSIS — W1830XA Fall on same level, unspecified, initial encounter: Secondary | ICD-10-CM | POA: Insufficient documentation

## 2020-11-01 LAB — CBC WITH DIFFERENTIAL/PLATELET
Abs Immature Granulocytes: 0.06 10*3/uL (ref 0.00–0.07)
Basophils Absolute: 0 10*3/uL (ref 0.0–0.1)
Basophils Relative: 0 %
Eosinophils Absolute: 0 10*3/uL (ref 0.0–0.5)
Eosinophils Relative: 0 %
HCT: 44.7 % (ref 36.0–46.0)
Hemoglobin: 14.6 g/dL (ref 12.0–15.0)
Immature Granulocytes: 1 %
Lymphocytes Relative: 13 %
Lymphs Abs: 1.5 10*3/uL (ref 0.7–4.0)
MCH: 31.6 pg (ref 26.0–34.0)
MCHC: 32.7 g/dL (ref 30.0–36.0)
MCV: 96.8 fL (ref 80.0–100.0)
Monocytes Absolute: 1.1 10*3/uL — ABNORMAL HIGH (ref 0.1–1.0)
Monocytes Relative: 9 %
Neutro Abs: 9.2 10*3/uL — ABNORMAL HIGH (ref 1.7–7.7)
Neutrophils Relative %: 77 %
Platelets: 204 10*3/uL (ref 150–400)
RBC: 4.62 MIL/uL (ref 3.87–5.11)
RDW: 18.6 % — ABNORMAL HIGH (ref 11.5–15.5)
WBC: 11.9 10*3/uL — ABNORMAL HIGH (ref 4.0–10.5)
nRBC: 0 % (ref 0.0–0.2)

## 2020-11-01 LAB — COMPREHENSIVE METABOLIC PANEL
ALT: 11 U/L (ref 0–44)
AST: 23 U/L (ref 15–41)
Albumin: 2.7 g/dL — ABNORMAL LOW (ref 3.5–5.0)
Alkaline Phosphatase: 40 U/L (ref 38–126)
Anion gap: 11 (ref 5–15)
BUN: 41 mg/dL — ABNORMAL HIGH (ref 8–23)
CO2: 24 mmol/L (ref 22–32)
Calcium: 9 mg/dL (ref 8.9–10.3)
Chloride: 97 mmol/L — ABNORMAL LOW (ref 98–111)
Creatinine, Ser: 1.66 mg/dL — ABNORMAL HIGH (ref 0.44–1.00)
GFR, Estimated: 29 mL/min — ABNORMAL LOW (ref >60)
Glucose, Bld: 98 mg/dL (ref 70–99)
Potassium: 4.5 mmol/L (ref 3.5–5.1)
Sodium: 132 mmol/L — ABNORMAL LOW (ref 135–145)
Total Bilirubin: 3 mg/dL — ABNORMAL HIGH (ref 0.3–1.2)
Total Protein: 7.3 g/dL (ref 6.5–8.1)

## 2020-11-01 NOTE — ED Notes (Signed)
Pt left room for imaging. Will collect blood-work once pt back to room.

## 2020-11-01 NOTE — ED Triage Notes (Signed)
Pt here with daughter from home.  Pt fell on 12/14, seen here form a head lac which was stapled. Pt now complaining of L leg and hip pain. Pt usually ambulatory with cane, until fall on Tuesday and is now unable to ambulate without pain.

## 2020-11-01 NOTE — ED Provider Notes (Signed)
Emergency Department Provider Note  ____________________________________________  Time seen: Approximately 3:57 PM  I have reviewed the triage vital signs and the nursing notes.   HISTORY  Chief Complaint Leg Pain   Historian Patient    HPI Kristen Graham is a 86 y.o. female with a history of atrial fibrillation and hypertension, presents to the emergency department with left hip pain.  Patient sustained a mechanical fall on 1214 and had a laceration repair in this emergency department.  Patient is accompanied by her daughter who states that since fall, patient has not been able to ambulate to the restroom on her own.  Prior to the fall, patient was ambulatory with a cane.  Patient's daughter also states that she would like her mom checked for UTI as she is prone to them.  No chest pain, chest tightness or abdominal pain.  Patient has chronic lower extremity edema and is under the care of wound care.  No other alleviating measures have been attempted.   Past Medical History:  Diagnosis Date  . Atrial fibrillation ([place]) 2020  . Glaucoma   . Hyperlipidemia   . Hypertension   . Left wrist fracture   . Prediabetes      Immunizations up to date:  Yes.     Past Medical History:  Diagnosis Date  . Atrial fibrillation ([place]) 2020  . Glaucoma   . Hyperlipidemia   . Hypertension   . Left wrist fracture   . Prediabetes     Patient Active Problem List   Diagnosis Date Noted  . Altered mental status 10/24/2020  . Leg edema 10/03/2020  . Hyperlipidemia LDL goal <70 10/03/2020  . Permanent atrial fibrillation ([place]) 08/15/2020  . Hematuria 05/06/2020  . PAD (peripheral artery disease) ([place]) 12/21/2019  . Chronic heart failure with preserved ejection fraction (HFpEF) ([place]) 09/14/2019  . Persistent atrial fibrillation ([place]) 08/29/2019  . Leg swelling 08/29/2019  . Skin ulcer of right calf, limited to breakdown of skin ([place]) 08/29/2019  . Essential hypertension 08/29/2019   . Open forearm fracture 04/06/2015    Past Surgical History:  Procedure Laterality Date  . CATARACT EXTRACTION    . I & D EXTREMITY Left 04/06/2015   Procedure: IRRIGATION AND DEBRIDEMENT left forearm;  Surgeon: Hessie Knows, MD;  Location: [facility] ORS;  Service: Orthopedics;  Laterality: Left;  . I&D leg    . ORIF RADIAL FRACTURE Left 04/06/2015   Procedure: OPEN REDUCTION INTERNAL FIXATION (ORIF) RADIAL FRACTURE;  Surgeon: Hessie Knows, MD;  Location: [facility] ORS;  Service: Orthopedics;  Laterality: Left;    Prior to Admission medications   Medication Sig Start Date End Date Taking? Authorizing Provider  ALPRAZolam Duanne Moron) 0.5 MG tablet Take 0.5 mg by mouth 2 (two) times daily as needed. 07/01/20   [provider]  furosemide (LASIX) 40 MG tablet Take 1 tablet (40 mg total) by mouth daily. 10/23/20 01/21/21  End, Harrell Gave, MD  metoprolol succinate (TOPROL-XL) 25 MG 24 hr tablet Take 1.5 tablets (37.5 mg total) by mouth in the morning and at bedtime. 10/23/20   End, Harrell Gave, MD  oxyCODONE (OXY IR/ROXICODONE) 5 MG immediate release tablet Take 1-2 tablets (5-10 mg total) by mouth every 4 (four) hours as needed for moderate pain. 04/08/15   Reche Dixon, PA-C  potassium chloride SA (KLOR-CON) 20 MEQ tablet Take 1 tablet (20 mEq total) by mouth daily. 10/23/20   End, Harrell Gave, MD  Rivaroxaban (XARELTO) 15 MG TABS tablet TAKE 1 TABLET BY MOUTH EVERY DAY WITH SUPPER  05/06/20   End, Harrell Gave, MD    Allergies Other and Sulfa antibiotics  Family History  Problem Relation Age of Onset  . Stroke Mother   . Arrhythmia Son     Social History Social History   Tobacco Use  . Smoking status: Never Smoker  . Smokeless tobacco: Never Used  Vaping Use  . Vaping Use: Never used  Substance Use Topics  . Alcohol use: No  . Drug use: Never     Review of Systems  Constitutional: No fever/chills Eyes:  No discharge ENT: No upper respiratory complaints. Respiratory: no cough. No SOB/  use of accessory muscles to breath Gastrointestinal:   No nausea, no vomiting.  No diarrhea.  No constipation. Musculoskeletal: Patient has left hip pain.  Skin: Negative for rash, abrasions, lacerations, ecchymosis.    ____________________________________________   PHYSICAL EXAM:  VITAL SIGNS: ED Triage Vitals  Enc Vitals Group     BP 11/01/20 1500 95/78     Pulse Rate 11/01/20 1500 89     Resp 11/01/20 1500 16     Temp 11/01/20 1500 97.7 F (36.5 C)     Temp Source 11/01/20 1500 Oral     SpO2 11/01/20 1500 100 %     Weight 11/01/20 1501 149 lb 14.6 oz (68 kg)     Height 11/01/20 1501 5\' 1"  (1.549 m)     Head Circumference --      Peak Flow --      Pain Score 11/01/20 1501 2     Pain Loc --      Pain Edu? --      Excl. in [place]? --      Constitutional: Alert and oriented. Well appearing and in no acute distress. Eyes: Conjunctivae are normal. PERRL. EOMI. Head: Atraumatic. ENT:      Nose: No congestion/rhinnorhea.      Mouth/Throat: Mucous membranes are moist.  Neck: No stridor.  FROM.  Cardiovascular: Normal rate, regular rhythm. Normal S1 and S2.  Good peripheral circulation. Respiratory: Normal respiratory effort without tachypnea or retractions. Lungs CTAB. Good air entry to the bases with no decreased or absent breath sounds Gastrointestinal: Bowel sounds x 4 quadrants. Soft and nontender to palpation. No guarding or rigidity. No distention. Musculoskeletal: Full range of motion to all extremities. No obvious deformities noted.  Patient has 3+ pitting edema of the bilateral lower extremities. Neurologic:  Normal for age. No gross focal neurologic deficits are appreciated.  Skin:  Skin is warm, dry and intact. No rash noted. Psychiatric: Mood and affect are normal for age. Speech and behavior are normal.   ____________________________________________   LABS (all labs ordered are listed, but only abnormal results are displayed)  Labs Reviewed  CBC WITH  DIFFERENTIAL/PLATELET - Abnormal; Notable for the following components:      Result Value   WBC 11.9 (*)    RDW 18.6 (*)    Neutro Abs 9.2 (*)    Monocytes Absolute 1.1 (*)    All other components within normal limits  COMPREHENSIVE METABOLIC PANEL - Abnormal; Notable for the following components:   Sodium 132 (*)    Chloride 97 (*)    BUN 41 (*)    Creatinine, Ser 1.66 (*)    Albumin 2.7 (*)    Total Bilirubin 3.0 (*)    GFR, Estimated 29 (*)    All other components within normal limits  URINE CULTURE  URINALYSIS, COMPLETE (UACMP) WITH MICROSCOPIC   ____________________________________________  EKG   ____________________________________________  RADIOLOGY I, Lannie Fields, personally viewed and evaluated these images (plain radiographs) as part of my medical decision making, as well as reviewing the written report by the radiologist.    [place] 2-3 VIEWS LEFT  Result Date: 11/01/2020 CLINICAL DATA:  [place] 2 days ago.  Left leg and hip pain. EXAM: DG HIP (WITH OR WITHOUT PELVIS) 2-3V LEFT COMPARISON:  None. FINDINGS: Superior and inferior rami fractures on the left. No other visible pelvic fracture. No femur fracture. IMPRESSION: Superior and inferior rami fractures on the left. Electronically Signed   By: Nelson Chimes M.D.   On: 11/01/2020 16:29    ____________________________________________    PROCEDURES  Procedure(s) performed:     Procedures     Medications - No data to display   ____________________________________________   INITIAL IMPRESSION / ASSESSMENT AND PLAN / ED COURSE  Pertinent labs & imaging results that were available during my care of the patient were reviewed by me and considered in my medical decision making (see chart for details).    Assessment and Plan: Superior and inferior pubic rami fractures Wound care 86 year old female presents to the emergency department with persistent left-sided hip discomfort since a  fall on Tuesday.  Vital signs are reassuring at triage.  On physical exam, patient was alert, active and oriented to time and place.  Patient did have some discomfort with internal and external rotation at the left hip.  X-ray of the left hip indicated superior and inferior pubic rami fractures.  Patient has been able to bear weight with assistance back and forth to the bathroom.  Recommended nonweightbearing unless ambulating to the restroom.  Patient has good social support from family who lives close by.  Patient's white blood cell count was mildly elevated with an associated left shift.  A urinalysis and urine culture were ordered.  Patient stated that she could not produce a urine sample and I offered catheterization.  Patient declined and patient's daughter stated that she would like to follow-up with PCP for a urinalysis.  Patient's creatinine was elevated at 1.6 which had trended up from patient's last creatinine which was 1.28.  Recommended decreasing furosemide from 40 mg to 20 mg for the next 2 days and then resuming 40 mg of furosemide daily as prescribed by cardiology.  Patient has a follow-up cardiology appointment in 3 weeks and I recommended discussing elevated creatinine at next visit.  Patient has wound care that visits her home once weekly.  Patient's daughter asked for her dressings to be changed.  Wounds were inspected and the emergency department.  Patient has several pressure ulcers are in various stages of healing along the lower extremities.  Patient's dressings were changed without complication.  I recommended Tylenol for discomfort as patient has had superior and inferior pubic rami fractures since Tuesday and has only had discomfort with attempted ambulation.  Patient's daughter feels comfortable with pain management plan.  Patient was advised to follow-up with orthopedics, Dr. Rudene Christians.  Return precautions were given to return with new or worsening  symptoms.   ____________________________________________  FINAL CLINICAL IMPRESSION(S) / ED DIAGNOSES  Final diagnoses:  Hip pain  Multiple closed fractures of pelvis without disruption of pelvic ring, initial encounter ([place])      NEW MEDICATIONS STARTED DURING THIS VISIT:  ED Discharge Orders    None          This chart was dictated using voice recognition software/Dragon. Despite best efforts to proofread, errors can occur  which can change the meaning. Any change was purely unintentional.     Lannie Fields, PA-C 11/01/20 1811    Delman Kitten, MD 11/01/20 2120

## 2020-11-01 NOTE — Discharge Instructions (Addendum)
Please take Tylenol for pain. Please make follow-up with orthopedics.  Phone number provided in discharge paperwork. Please take 20 mg of furosemide for the next 2 days instead of 40 mg.  Resume 40 mg of furosemide after the 2-day period. Patient's creatinine was mildly elevated during today's emergency department encounter.  I would like for you to share that with cardiology during patient's next appointment in 3 weeks.

## 2020-11-04 DIAGNOSIS — Z6822 Body mass index (BMI) 22.0-22.9, adult: Secondary | ICD-10-CM | POA: Diagnosis not present

## 2020-11-04 DIAGNOSIS — I4819 Other persistent atrial fibrillation: Secondary | ICD-10-CM | POA: Diagnosis not present

## 2020-11-04 DIAGNOSIS — L97829 Non-pressure chronic ulcer of other part of left lower leg with unspecified severity: Secondary | ICD-10-CM | POA: Diagnosis not present

## 2020-11-04 DIAGNOSIS — G4700 Insomnia, unspecified: Secondary | ICD-10-CM | POA: Diagnosis not present

## 2020-11-04 DIAGNOSIS — Z4800 Encounter for change or removal of nonsurgical wound dressing: Secondary | ICD-10-CM | POA: Diagnosis not present

## 2020-11-04 DIAGNOSIS — L97819 Non-pressure chronic ulcer of other part of right lower leg with unspecified severity: Secondary | ICD-10-CM | POA: Diagnosis not present

## 2020-11-04 DIAGNOSIS — I5030 Unspecified diastolic (congestive) heart failure: Secondary | ICD-10-CM | POA: Diagnosis not present

## 2020-11-04 DIAGNOSIS — R350 Frequency of micturition: Secondary | ICD-10-CM | POA: Diagnosis not present

## 2020-11-04 DIAGNOSIS — N183 Chronic kidney disease, stage 3 unspecified: Secondary | ICD-10-CM | POA: Diagnosis not present

## 2020-11-04 DIAGNOSIS — E785 Hyperlipidemia, unspecified: Secondary | ICD-10-CM | POA: Diagnosis not present

## 2020-11-04 DIAGNOSIS — Z87440 Personal history of urinary (tract) infections: Secondary | ICD-10-CM | POA: Diagnosis not present

## 2020-11-04 DIAGNOSIS — I70248 Atherosclerosis of native arteries of left leg with ulceration of other part of lower left leg: Secondary | ICD-10-CM | POA: Diagnosis not present

## 2020-11-04 DIAGNOSIS — Z993 Dependence on wheelchair: Secondary | ICD-10-CM | POA: Diagnosis not present

## 2020-11-04 DIAGNOSIS — I70238 Atherosclerosis of native arteries of right leg with ulceration of other part of lower right leg: Secondary | ICD-10-CM | POA: Diagnosis not present

## 2020-11-04 DIAGNOSIS — Z7901 Long term (current) use of anticoagulants: Secondary | ICD-10-CM | POA: Diagnosis not present

## 2020-11-04 DIAGNOSIS — R7303 Prediabetes: Secondary | ICD-10-CM | POA: Diagnosis not present

## 2020-11-05 DIAGNOSIS — Z87440 Personal history of urinary (tract) infections: Secondary | ICD-10-CM | POA: Diagnosis not present

## 2020-11-05 DIAGNOSIS — I70248 Atherosclerosis of native arteries of left leg with ulceration of other part of lower left leg: Secondary | ICD-10-CM | POA: Diagnosis not present

## 2020-11-05 DIAGNOSIS — I5030 Unspecified diastolic (congestive) heart failure: Secondary | ICD-10-CM | POA: Diagnosis not present

## 2020-11-05 DIAGNOSIS — Z993 Dependence on wheelchair: Secondary | ICD-10-CM | POA: Diagnosis not present

## 2020-11-05 DIAGNOSIS — R7303 Prediabetes: Secondary | ICD-10-CM | POA: Diagnosis not present

## 2020-11-05 DIAGNOSIS — E785 Hyperlipidemia, unspecified: Secondary | ICD-10-CM | POA: Diagnosis not present

## 2020-11-05 DIAGNOSIS — L97829 Non-pressure chronic ulcer of other part of left lower leg with unspecified severity: Secondary | ICD-10-CM | POA: Diagnosis not present

## 2020-11-05 DIAGNOSIS — G4700 Insomnia, unspecified: Secondary | ICD-10-CM | POA: Diagnosis not present

## 2020-11-05 DIAGNOSIS — N183 Chronic kidney disease, stage 3 unspecified: Secondary | ICD-10-CM | POA: Diagnosis not present

## 2020-11-05 DIAGNOSIS — Z4800 Encounter for change or removal of nonsurgical wound dressing: Secondary | ICD-10-CM | POA: Diagnosis not present

## 2020-11-05 DIAGNOSIS — R350 Frequency of micturition: Secondary | ICD-10-CM | POA: Diagnosis not present

## 2020-11-05 DIAGNOSIS — I70238 Atherosclerosis of native arteries of right leg with ulceration of other part of lower right leg: Secondary | ICD-10-CM | POA: Diagnosis not present

## 2020-11-05 DIAGNOSIS — L97819 Non-pressure chronic ulcer of other part of right lower leg with unspecified severity: Secondary | ICD-10-CM | POA: Diagnosis not present

## 2020-11-05 DIAGNOSIS — Z7901 Long term (current) use of anticoagulants: Secondary | ICD-10-CM | POA: Diagnosis not present

## 2020-11-05 DIAGNOSIS — Z6822 Body mass index (BMI) 22.0-22.9, adult: Secondary | ICD-10-CM | POA: Diagnosis not present

## 2020-11-05 DIAGNOSIS — I4819 Other persistent atrial fibrillation: Secondary | ICD-10-CM | POA: Diagnosis not present

## 2020-11-06 ENCOUNTER — Other Ambulatory Visit: Payer: Self-pay | Admitting: Internal Medicine

## 2020-11-06 DIAGNOSIS — L89151 Pressure ulcer of sacral region, stage 1: Secondary | ICD-10-CM | POA: Diagnosis not present

## 2020-11-06 DIAGNOSIS — L97221 Non-pressure chronic ulcer of left calf limited to breakdown of skin: Secondary | ICD-10-CM | POA: Diagnosis not present

## 2020-11-06 DIAGNOSIS — L97211 Non-pressure chronic ulcer of right calf limited to breakdown of skin: Secondary | ICD-10-CM | POA: Diagnosis not present

## 2020-11-06 NOTE — Telephone Encounter (Signed)
Refill request

## 2020-11-06 NOTE — Telephone Encounter (Signed)
Pt's age 86, wt 68 kg, SCr 1.66, CrCl 24.66, last ov w/ CE 11/02/20.

## 2020-11-07 DIAGNOSIS — G4700 Insomnia, unspecified: Secondary | ICD-10-CM | POA: Diagnosis not present

## 2020-11-07 DIAGNOSIS — Z6822 Body mass index (BMI) 22.0-22.9, adult: Secondary | ICD-10-CM | POA: Diagnosis not present

## 2020-11-07 DIAGNOSIS — R350 Frequency of micturition: Secondary | ICD-10-CM | POA: Diagnosis not present

## 2020-11-07 DIAGNOSIS — N183 Chronic kidney disease, stage 3 unspecified: Secondary | ICD-10-CM | POA: Diagnosis not present

## 2020-11-07 DIAGNOSIS — E785 Hyperlipidemia, unspecified: Secondary | ICD-10-CM | POA: Diagnosis not present

## 2020-11-07 DIAGNOSIS — I70238 Atherosclerosis of native arteries of right leg with ulceration of other part of lower right leg: Secondary | ICD-10-CM | POA: Diagnosis not present

## 2020-11-07 DIAGNOSIS — I5030 Unspecified diastolic (congestive) heart failure: Secondary | ICD-10-CM | POA: Diagnosis not present

## 2020-11-07 DIAGNOSIS — Z4800 Encounter for change or removal of nonsurgical wound dressing: Secondary | ICD-10-CM | POA: Diagnosis not present

## 2020-11-07 DIAGNOSIS — Z87440 Personal history of urinary (tract) infections: Secondary | ICD-10-CM | POA: Diagnosis not present

## 2020-11-07 DIAGNOSIS — L97819 Non-pressure chronic ulcer of other part of right lower leg with unspecified severity: Secondary | ICD-10-CM | POA: Diagnosis not present

## 2020-11-07 DIAGNOSIS — Z7901 Long term (current) use of anticoagulants: Secondary | ICD-10-CM | POA: Diagnosis not present

## 2020-11-07 DIAGNOSIS — I4819 Other persistent atrial fibrillation: Secondary | ICD-10-CM | POA: Diagnosis not present

## 2020-11-07 DIAGNOSIS — I70248 Atherosclerosis of native arteries of left leg with ulceration of other part of lower left leg: Secondary | ICD-10-CM | POA: Diagnosis not present

## 2020-11-07 DIAGNOSIS — L97829 Non-pressure chronic ulcer of other part of left lower leg with unspecified severity: Secondary | ICD-10-CM | POA: Diagnosis not present

## 2020-11-07 DIAGNOSIS — R7303 Prediabetes: Secondary | ICD-10-CM | POA: Diagnosis not present

## 2020-11-07 DIAGNOSIS — Z993 Dependence on wheelchair: Secondary | ICD-10-CM | POA: Diagnosis not present

## 2020-11-10 DIAGNOSIS — L97221 Non-pressure chronic ulcer of left calf limited to breakdown of skin: Secondary | ICD-10-CM | POA: Diagnosis not present

## 2020-11-10 DIAGNOSIS — L97211 Non-pressure chronic ulcer of right calf limited to breakdown of skin: Secondary | ICD-10-CM | POA: Diagnosis not present

## 2020-11-11 DIAGNOSIS — L97829 Non-pressure chronic ulcer of other part of left lower leg with unspecified severity: Secondary | ICD-10-CM | POA: Diagnosis not present

## 2020-11-11 DIAGNOSIS — Z7901 Long term (current) use of anticoagulants: Secondary | ICD-10-CM | POA: Diagnosis not present

## 2020-11-11 DIAGNOSIS — N183 Chronic kidney disease, stage 3 unspecified: Secondary | ICD-10-CM | POA: Diagnosis not present

## 2020-11-11 DIAGNOSIS — I70248 Atherosclerosis of native arteries of left leg with ulceration of other part of lower left leg: Secondary | ICD-10-CM | POA: Diagnosis not present

## 2020-11-11 DIAGNOSIS — I4819 Other persistent atrial fibrillation: Secondary | ICD-10-CM | POA: Diagnosis not present

## 2020-11-11 DIAGNOSIS — G4700 Insomnia, unspecified: Secondary | ICD-10-CM | POA: Diagnosis not present

## 2020-11-11 DIAGNOSIS — R7303 Prediabetes: Secondary | ICD-10-CM | POA: Diagnosis not present

## 2020-11-11 DIAGNOSIS — I5030 Unspecified diastolic (congestive) heart failure: Secondary | ICD-10-CM | POA: Diagnosis not present

## 2020-11-11 DIAGNOSIS — Z993 Dependence on wheelchair: Secondary | ICD-10-CM | POA: Diagnosis not present

## 2020-11-11 DIAGNOSIS — I70238 Atherosclerosis of native arteries of right leg with ulceration of other part of lower right leg: Secondary | ICD-10-CM | POA: Diagnosis not present

## 2020-11-11 DIAGNOSIS — L97819 Non-pressure chronic ulcer of other part of right lower leg with unspecified severity: Secondary | ICD-10-CM | POA: Diagnosis not present

## 2020-11-11 DIAGNOSIS — E785 Hyperlipidemia, unspecified: Secondary | ICD-10-CM | POA: Diagnosis not present

## 2020-11-11 DIAGNOSIS — Z87440 Personal history of urinary (tract) infections: Secondary | ICD-10-CM | POA: Diagnosis not present

## 2020-11-11 DIAGNOSIS — Z4800 Encounter for change or removal of nonsurgical wound dressing: Secondary | ICD-10-CM | POA: Diagnosis not present

## 2020-11-11 DIAGNOSIS — R350 Frequency of micturition: Secondary | ICD-10-CM | POA: Diagnosis not present

## 2020-11-11 DIAGNOSIS — Z6822 Body mass index (BMI) 22.0-22.9, adult: Secondary | ICD-10-CM | POA: Diagnosis not present

## 2020-11-12 DIAGNOSIS — Z7901 Long term (current) use of anticoagulants: Secondary | ICD-10-CM | POA: Diagnosis not present

## 2020-11-12 DIAGNOSIS — Z993 Dependence on wheelchair: Secondary | ICD-10-CM | POA: Diagnosis not present

## 2020-11-12 DIAGNOSIS — Z87440 Personal history of urinary (tract) infections: Secondary | ICD-10-CM | POA: Diagnosis not present

## 2020-11-12 DIAGNOSIS — I70238 Atherosclerosis of native arteries of right leg with ulceration of other part of lower right leg: Secondary | ICD-10-CM | POA: Diagnosis not present

## 2020-11-12 DIAGNOSIS — L97829 Non-pressure chronic ulcer of other part of left lower leg with unspecified severity: Secondary | ICD-10-CM | POA: Diagnosis not present

## 2020-11-12 DIAGNOSIS — G4700 Insomnia, unspecified: Secondary | ICD-10-CM | POA: Diagnosis not present

## 2020-11-12 DIAGNOSIS — R350 Frequency of micturition: Secondary | ICD-10-CM | POA: Diagnosis not present

## 2020-11-12 DIAGNOSIS — I70248 Atherosclerosis of native arteries of left leg with ulceration of other part of lower left leg: Secondary | ICD-10-CM | POA: Diagnosis not present

## 2020-11-12 DIAGNOSIS — I5030 Unspecified diastolic (congestive) heart failure: Secondary | ICD-10-CM | POA: Diagnosis not present

## 2020-11-12 DIAGNOSIS — N183 Chronic kidney disease, stage 3 unspecified: Secondary | ICD-10-CM | POA: Diagnosis not present

## 2020-11-12 DIAGNOSIS — I4819 Other persistent atrial fibrillation: Secondary | ICD-10-CM | POA: Diagnosis not present

## 2020-11-12 DIAGNOSIS — L97819 Non-pressure chronic ulcer of other part of right lower leg with unspecified severity: Secondary | ICD-10-CM | POA: Diagnosis not present

## 2020-11-12 DIAGNOSIS — Z6822 Body mass index (BMI) 22.0-22.9, adult: Secondary | ICD-10-CM | POA: Diagnosis not present

## 2020-11-12 DIAGNOSIS — R7303 Prediabetes: Secondary | ICD-10-CM | POA: Diagnosis not present

## 2020-11-12 DIAGNOSIS — E785 Hyperlipidemia, unspecified: Secondary | ICD-10-CM | POA: Diagnosis not present

## 2020-11-12 DIAGNOSIS — Z4800 Encounter for change or removal of nonsurgical wound dressing: Secondary | ICD-10-CM | POA: Diagnosis not present

## 2020-11-13 DIAGNOSIS — Z87440 Personal history of urinary (tract) infections: Secondary | ICD-10-CM | POA: Diagnosis not present

## 2020-11-13 DIAGNOSIS — Z4800 Encounter for change or removal of nonsurgical wound dressing: Secondary | ICD-10-CM | POA: Diagnosis not present

## 2020-11-13 DIAGNOSIS — Z993 Dependence on wheelchair: Secondary | ICD-10-CM | POA: Diagnosis not present

## 2020-11-13 DIAGNOSIS — L97829 Non-pressure chronic ulcer of other part of left lower leg with unspecified severity: Secondary | ICD-10-CM | POA: Diagnosis not present

## 2020-11-13 DIAGNOSIS — N183 Chronic kidney disease, stage 3 unspecified: Secondary | ICD-10-CM | POA: Diagnosis not present

## 2020-11-13 DIAGNOSIS — G4700 Insomnia, unspecified: Secondary | ICD-10-CM | POA: Diagnosis not present

## 2020-11-13 DIAGNOSIS — L97819 Non-pressure chronic ulcer of other part of right lower leg with unspecified severity: Secondary | ICD-10-CM | POA: Diagnosis not present

## 2020-11-13 DIAGNOSIS — I70248 Atherosclerosis of native arteries of left leg with ulceration of other part of lower left leg: Secondary | ICD-10-CM | POA: Diagnosis not present

## 2020-11-13 DIAGNOSIS — I4819 Other persistent atrial fibrillation: Secondary | ICD-10-CM | POA: Diagnosis not present

## 2020-11-13 DIAGNOSIS — R350 Frequency of micturition: Secondary | ICD-10-CM | POA: Diagnosis not present

## 2020-11-13 DIAGNOSIS — I70238 Atherosclerosis of native arteries of right leg with ulceration of other part of lower right leg: Secondary | ICD-10-CM | POA: Diagnosis not present

## 2020-11-13 DIAGNOSIS — I5030 Unspecified diastolic (congestive) heart failure: Secondary | ICD-10-CM | POA: Diagnosis not present

## 2020-11-13 DIAGNOSIS — E785 Hyperlipidemia, unspecified: Secondary | ICD-10-CM | POA: Diagnosis not present

## 2020-11-13 DIAGNOSIS — Z6822 Body mass index (BMI) 22.0-22.9, adult: Secondary | ICD-10-CM | POA: Diagnosis not present

## 2020-11-13 DIAGNOSIS — R7303 Prediabetes: Secondary | ICD-10-CM | POA: Diagnosis not present

## 2020-11-13 DIAGNOSIS — Z7901 Long term (current) use of anticoagulants: Secondary | ICD-10-CM | POA: Diagnosis not present

## 2020-11-14 DIAGNOSIS — Z7901 Long term (current) use of anticoagulants: Secondary | ICD-10-CM | POA: Diagnosis not present

## 2020-11-14 DIAGNOSIS — I4819 Other persistent atrial fibrillation: Secondary | ICD-10-CM | POA: Diagnosis not present

## 2020-11-14 DIAGNOSIS — N183 Chronic kidney disease, stage 3 unspecified: Secondary | ICD-10-CM | POA: Diagnosis not present

## 2020-11-14 DIAGNOSIS — L97829 Non-pressure chronic ulcer of other part of left lower leg with unspecified severity: Secondary | ICD-10-CM | POA: Diagnosis not present

## 2020-11-14 DIAGNOSIS — L89151 Pressure ulcer of sacral region, stage 1: Secondary | ICD-10-CM | POA: Diagnosis not present

## 2020-11-14 DIAGNOSIS — Z87440 Personal history of urinary (tract) infections: Secondary | ICD-10-CM | POA: Diagnosis not present

## 2020-11-14 DIAGNOSIS — G4700 Insomnia, unspecified: Secondary | ICD-10-CM | POA: Diagnosis not present

## 2020-11-14 DIAGNOSIS — R7303 Prediabetes: Secondary | ICD-10-CM | POA: Diagnosis not present

## 2020-11-14 DIAGNOSIS — Z4800 Encounter for change or removal of nonsurgical wound dressing: Secondary | ICD-10-CM | POA: Diagnosis not present

## 2020-11-14 DIAGNOSIS — L97211 Non-pressure chronic ulcer of right calf limited to breakdown of skin: Secondary | ICD-10-CM | POA: Diagnosis not present

## 2020-11-14 DIAGNOSIS — L97819 Non-pressure chronic ulcer of other part of right lower leg with unspecified severity: Secondary | ICD-10-CM | POA: Diagnosis not present

## 2020-11-14 DIAGNOSIS — L97221 Non-pressure chronic ulcer of left calf limited to breakdown of skin: Secondary | ICD-10-CM | POA: Diagnosis not present

## 2020-11-14 DIAGNOSIS — I70248 Atherosclerosis of native arteries of left leg with ulceration of other part of lower left leg: Secondary | ICD-10-CM | POA: Diagnosis not present

## 2020-11-14 DIAGNOSIS — Z6822 Body mass index (BMI) 22.0-22.9, adult: Secondary | ICD-10-CM | POA: Diagnosis not present

## 2020-11-14 DIAGNOSIS — Z993 Dependence on wheelchair: Secondary | ICD-10-CM | POA: Diagnosis not present

## 2020-11-14 DIAGNOSIS — I5030 Unspecified diastolic (congestive) heart failure: Secondary | ICD-10-CM | POA: Diagnosis not present

## 2020-11-14 DIAGNOSIS — I70238 Atherosclerosis of native arteries of right leg with ulceration of other part of lower right leg: Secondary | ICD-10-CM | POA: Diagnosis not present

## 2020-11-14 DIAGNOSIS — E785 Hyperlipidemia, unspecified: Secondary | ICD-10-CM | POA: Diagnosis not present

## 2020-11-14 DIAGNOSIS — R350 Frequency of micturition: Secondary | ICD-10-CM | POA: Diagnosis not present

## 2020-11-15 DIAGNOSIS — R350 Frequency of micturition: Secondary | ICD-10-CM | POA: Diagnosis not present

## 2020-11-15 DIAGNOSIS — I70248 Atherosclerosis of native arteries of left leg with ulceration of other part of lower left leg: Secondary | ICD-10-CM | POA: Diagnosis not present

## 2020-11-15 DIAGNOSIS — I70238 Atherosclerosis of native arteries of right leg with ulceration of other part of lower right leg: Secondary | ICD-10-CM | POA: Diagnosis not present

## 2020-11-15 DIAGNOSIS — Z6822 Body mass index (BMI) 22.0-22.9, adult: Secondary | ICD-10-CM | POA: Diagnosis not present

## 2020-11-15 DIAGNOSIS — Z993 Dependence on wheelchair: Secondary | ICD-10-CM | POA: Diagnosis not present

## 2020-11-15 DIAGNOSIS — N183 Chronic kidney disease, stage 3 unspecified: Secondary | ICD-10-CM | POA: Diagnosis not present

## 2020-11-15 DIAGNOSIS — L97829 Non-pressure chronic ulcer of other part of left lower leg with unspecified severity: Secondary | ICD-10-CM | POA: Diagnosis not present

## 2020-11-15 DIAGNOSIS — G4700 Insomnia, unspecified: Secondary | ICD-10-CM | POA: Diagnosis not present

## 2020-11-15 DIAGNOSIS — L97819 Non-pressure chronic ulcer of other part of right lower leg with unspecified severity: Secondary | ICD-10-CM | POA: Diagnosis not present

## 2020-11-15 DIAGNOSIS — R7303 Prediabetes: Secondary | ICD-10-CM | POA: Diagnosis not present

## 2020-11-15 DIAGNOSIS — E785 Hyperlipidemia, unspecified: Secondary | ICD-10-CM | POA: Diagnosis not present

## 2020-11-15 DIAGNOSIS — Z7901 Long term (current) use of anticoagulants: Secondary | ICD-10-CM | POA: Diagnosis not present

## 2020-11-15 DIAGNOSIS — I5030 Unspecified diastolic (congestive) heart failure: Secondary | ICD-10-CM | POA: Diagnosis not present

## 2020-11-15 DIAGNOSIS — I4819 Other persistent atrial fibrillation: Secondary | ICD-10-CM | POA: Diagnosis not present

## 2020-11-15 DIAGNOSIS — Z87440 Personal history of urinary (tract) infections: Secondary | ICD-10-CM | POA: Diagnosis not present

## 2020-11-15 DIAGNOSIS — Z4800 Encounter for change or removal of nonsurgical wound dressing: Secondary | ICD-10-CM | POA: Diagnosis not present

## 2020-11-18 DIAGNOSIS — L97829 Non-pressure chronic ulcer of other part of left lower leg with unspecified severity: Secondary | ICD-10-CM | POA: Diagnosis not present

## 2020-11-18 DIAGNOSIS — R7303 Prediabetes: Secondary | ICD-10-CM | POA: Diagnosis not present

## 2020-11-18 DIAGNOSIS — Z87440 Personal history of urinary (tract) infections: Secondary | ICD-10-CM | POA: Diagnosis not present

## 2020-11-18 DIAGNOSIS — I4819 Other persistent atrial fibrillation: Secondary | ICD-10-CM | POA: Diagnosis not present

## 2020-11-18 DIAGNOSIS — Z6822 Body mass index (BMI) 22.0-22.9, adult: Secondary | ICD-10-CM | POA: Diagnosis not present

## 2020-11-18 DIAGNOSIS — Z4800 Encounter for change or removal of nonsurgical wound dressing: Secondary | ICD-10-CM | POA: Diagnosis not present

## 2020-11-18 DIAGNOSIS — E785 Hyperlipidemia, unspecified: Secondary | ICD-10-CM | POA: Diagnosis not present

## 2020-11-18 DIAGNOSIS — N183 Chronic kidney disease, stage 3 unspecified: Secondary | ICD-10-CM | POA: Diagnosis not present

## 2020-11-18 DIAGNOSIS — G4700 Insomnia, unspecified: Secondary | ICD-10-CM | POA: Diagnosis not present

## 2020-11-18 DIAGNOSIS — R350 Frequency of micturition: Secondary | ICD-10-CM | POA: Diagnosis not present

## 2020-11-18 DIAGNOSIS — I70248 Atherosclerosis of native arteries of left leg with ulceration of other part of lower left leg: Secondary | ICD-10-CM | POA: Diagnosis not present

## 2020-11-18 DIAGNOSIS — I70238 Atherosclerosis of native arteries of right leg with ulceration of other part of lower right leg: Secondary | ICD-10-CM | POA: Diagnosis not present

## 2020-11-18 DIAGNOSIS — Z7901 Long term (current) use of anticoagulants: Secondary | ICD-10-CM | POA: Diagnosis not present

## 2020-11-18 DIAGNOSIS — I5030 Unspecified diastolic (congestive) heart failure: Secondary | ICD-10-CM | POA: Diagnosis not present

## 2020-11-18 DIAGNOSIS — L97819 Non-pressure chronic ulcer of other part of right lower leg with unspecified severity: Secondary | ICD-10-CM | POA: Diagnosis not present

## 2020-11-18 DIAGNOSIS — Z993 Dependence on wheelchair: Secondary | ICD-10-CM | POA: Diagnosis not present

## 2020-11-19 DIAGNOSIS — N183 Chronic kidney disease, stage 3 unspecified: Secondary | ICD-10-CM | POA: Diagnosis not present

## 2020-11-19 DIAGNOSIS — I5030 Unspecified diastolic (congestive) heart failure: Secondary | ICD-10-CM | POA: Diagnosis not present

## 2020-11-19 DIAGNOSIS — L97829 Non-pressure chronic ulcer of other part of left lower leg with unspecified severity: Secondary | ICD-10-CM | POA: Diagnosis not present

## 2020-11-19 DIAGNOSIS — Z993 Dependence on wheelchair: Secondary | ICD-10-CM | POA: Diagnosis not present

## 2020-11-19 DIAGNOSIS — G4700 Insomnia, unspecified: Secondary | ICD-10-CM | POA: Diagnosis not present

## 2020-11-19 DIAGNOSIS — Z7901 Long term (current) use of anticoagulants: Secondary | ICD-10-CM | POA: Diagnosis not present

## 2020-11-19 DIAGNOSIS — I70248 Atherosclerosis of native arteries of left leg with ulceration of other part of lower left leg: Secondary | ICD-10-CM | POA: Diagnosis not present

## 2020-11-19 DIAGNOSIS — I70238 Atherosclerosis of native arteries of right leg with ulceration of other part of lower right leg: Secondary | ICD-10-CM | POA: Diagnosis not present

## 2020-11-19 DIAGNOSIS — R7303 Prediabetes: Secondary | ICD-10-CM | POA: Diagnosis not present

## 2020-11-19 DIAGNOSIS — I4819 Other persistent atrial fibrillation: Secondary | ICD-10-CM | POA: Diagnosis not present

## 2020-11-19 DIAGNOSIS — Z6822 Body mass index (BMI) 22.0-22.9, adult: Secondary | ICD-10-CM | POA: Diagnosis not present

## 2020-11-19 DIAGNOSIS — E785 Hyperlipidemia, unspecified: Secondary | ICD-10-CM | POA: Diagnosis not present

## 2020-11-19 DIAGNOSIS — R350 Frequency of micturition: Secondary | ICD-10-CM | POA: Diagnosis not present

## 2020-11-19 DIAGNOSIS — Z87440 Personal history of urinary (tract) infections: Secondary | ICD-10-CM | POA: Diagnosis not present

## 2020-11-19 DIAGNOSIS — L97819 Non-pressure chronic ulcer of other part of right lower leg with unspecified severity: Secondary | ICD-10-CM | POA: Diagnosis not present

## 2020-11-19 DIAGNOSIS — Z4800 Encounter for change or removal of nonsurgical wound dressing: Secondary | ICD-10-CM | POA: Diagnosis not present

## 2020-11-20 DIAGNOSIS — Z4800 Encounter for change or removal of nonsurgical wound dressing: Secondary | ICD-10-CM | POA: Diagnosis not present

## 2020-11-20 DIAGNOSIS — R350 Frequency of micturition: Secondary | ICD-10-CM | POA: Diagnosis not present

## 2020-11-20 DIAGNOSIS — L97819 Non-pressure chronic ulcer of other part of right lower leg with unspecified severity: Secondary | ICD-10-CM | POA: Diagnosis not present

## 2020-11-20 DIAGNOSIS — L89151 Pressure ulcer of sacral region, stage 1: Secondary | ICD-10-CM | POA: Diagnosis not present

## 2020-11-20 DIAGNOSIS — I70238 Atherosclerosis of native arteries of right leg with ulceration of other part of lower right leg: Secondary | ICD-10-CM | POA: Diagnosis not present

## 2020-11-20 DIAGNOSIS — L97221 Non-pressure chronic ulcer of left calf limited to breakdown of skin: Secondary | ICD-10-CM | POA: Diagnosis not present

## 2020-11-20 DIAGNOSIS — Z6822 Body mass index (BMI) 22.0-22.9, adult: Secondary | ICD-10-CM | POA: Diagnosis not present

## 2020-11-20 DIAGNOSIS — I4819 Other persistent atrial fibrillation: Secondary | ICD-10-CM | POA: Diagnosis not present

## 2020-11-20 DIAGNOSIS — L97829 Non-pressure chronic ulcer of other part of left lower leg with unspecified severity: Secondary | ICD-10-CM | POA: Diagnosis not present

## 2020-11-20 DIAGNOSIS — G4700 Insomnia, unspecified: Secondary | ICD-10-CM | POA: Diagnosis not present

## 2020-11-20 DIAGNOSIS — R7303 Prediabetes: Secondary | ICD-10-CM | POA: Diagnosis not present

## 2020-11-20 DIAGNOSIS — E785 Hyperlipidemia, unspecified: Secondary | ICD-10-CM | POA: Diagnosis not present

## 2020-11-20 DIAGNOSIS — Z87440 Personal history of urinary (tract) infections: Secondary | ICD-10-CM | POA: Diagnosis not present

## 2020-11-20 DIAGNOSIS — N183 Chronic kidney disease, stage 3 unspecified: Secondary | ICD-10-CM | POA: Diagnosis not present

## 2020-11-20 DIAGNOSIS — Z7901 Long term (current) use of anticoagulants: Secondary | ICD-10-CM | POA: Diagnosis not present

## 2020-11-20 DIAGNOSIS — L97211 Non-pressure chronic ulcer of right calf limited to breakdown of skin: Secondary | ICD-10-CM | POA: Diagnosis not present

## 2020-11-20 DIAGNOSIS — Z993 Dependence on wheelchair: Secondary | ICD-10-CM | POA: Diagnosis not present

## 2020-11-20 DIAGNOSIS — I5030 Unspecified diastolic (congestive) heart failure: Secondary | ICD-10-CM | POA: Diagnosis not present

## 2020-11-20 DIAGNOSIS — I70248 Atherosclerosis of native arteries of left leg with ulceration of other part of lower left leg: Secondary | ICD-10-CM | POA: Diagnosis not present

## 2020-11-20 DIAGNOSIS — S32592A Other specified fracture of left pubis, initial encounter for closed fracture: Secondary | ICD-10-CM | POA: Diagnosis not present

## 2020-11-21 ENCOUNTER — Telehealth: Payer: Self-pay | Admitting: Internal Medicine

## 2020-11-21 DIAGNOSIS — I4819 Other persistent atrial fibrillation: Secondary | ICD-10-CM | POA: Diagnosis not present

## 2020-11-21 DIAGNOSIS — I70238 Atherosclerosis of native arteries of right leg with ulceration of other part of lower right leg: Secondary | ICD-10-CM | POA: Diagnosis not present

## 2020-11-21 DIAGNOSIS — Z4800 Encounter for change or removal of nonsurgical wound dressing: Secondary | ICD-10-CM | POA: Diagnosis not present

## 2020-11-21 DIAGNOSIS — Z993 Dependence on wheelchair: Secondary | ICD-10-CM | POA: Diagnosis not present

## 2020-11-21 DIAGNOSIS — G4700 Insomnia, unspecified: Secondary | ICD-10-CM | POA: Diagnosis not present

## 2020-11-21 DIAGNOSIS — R7303 Prediabetes: Secondary | ICD-10-CM | POA: Diagnosis not present

## 2020-11-21 DIAGNOSIS — Z87440 Personal history of urinary (tract) infections: Secondary | ICD-10-CM | POA: Diagnosis not present

## 2020-11-21 DIAGNOSIS — R350 Frequency of micturition: Secondary | ICD-10-CM | POA: Diagnosis not present

## 2020-11-21 DIAGNOSIS — L97829 Non-pressure chronic ulcer of other part of left lower leg with unspecified severity: Secondary | ICD-10-CM | POA: Diagnosis not present

## 2020-11-21 DIAGNOSIS — Z7901 Long term (current) use of anticoagulants: Secondary | ICD-10-CM | POA: Diagnosis not present

## 2020-11-21 DIAGNOSIS — L97819 Non-pressure chronic ulcer of other part of right lower leg with unspecified severity: Secondary | ICD-10-CM | POA: Diagnosis not present

## 2020-11-21 DIAGNOSIS — E785 Hyperlipidemia, unspecified: Secondary | ICD-10-CM | POA: Diagnosis not present

## 2020-11-21 DIAGNOSIS — I70248 Atherosclerosis of native arteries of left leg with ulceration of other part of lower left leg: Secondary | ICD-10-CM | POA: Diagnosis not present

## 2020-11-21 DIAGNOSIS — N183 Chronic kidney disease, stage 3 unspecified: Secondary | ICD-10-CM | POA: Diagnosis not present

## 2020-11-21 DIAGNOSIS — I5030 Unspecified diastolic (congestive) heart failure: Secondary | ICD-10-CM | POA: Diagnosis not present

## 2020-11-21 DIAGNOSIS — Z6822 Body mass index (BMI) 22.0-22.9, adult: Secondary | ICD-10-CM | POA: Diagnosis not present

## 2020-11-21 NOTE — Telephone Encounter (Signed)
Patient son Kristen Graham returning call  Would like to know what MG vitamin C is appropriate or best to help heal these wounds Please call to discuss

## 2020-11-21 NOTE — Telephone Encounter (Signed)
Kristen Graham not listed on DPR. Called Kristen Graham, on [place]. No answer, left message that it is ok for patient to take Vitamin C supplement with her current medications and to call back if they have any further questions.

## 2020-11-21 NOTE — Telephone Encounter (Signed)
It is fine to take vitamin C with her current medications.

## 2020-11-21 NOTE — Telephone Encounter (Signed)
Patient's daughter asks if it is ok patient takes Vitamin C along with her heart medication. States her wound care provider recommended she take this.

## 2020-11-22 DIAGNOSIS — R7303 Prediabetes: Secondary | ICD-10-CM | POA: Diagnosis not present

## 2020-11-22 DIAGNOSIS — I70248 Atherosclerosis of native arteries of left leg with ulceration of other part of lower left leg: Secondary | ICD-10-CM | POA: Diagnosis not present

## 2020-11-22 DIAGNOSIS — Z993 Dependence on wheelchair: Secondary | ICD-10-CM | POA: Diagnosis not present

## 2020-11-22 DIAGNOSIS — Z87440 Personal history of urinary (tract) infections: Secondary | ICD-10-CM | POA: Diagnosis not present

## 2020-11-22 DIAGNOSIS — N183 Chronic kidney disease, stage 3 unspecified: Secondary | ICD-10-CM | POA: Diagnosis not present

## 2020-11-22 DIAGNOSIS — Z4800 Encounter for change or removal of nonsurgical wound dressing: Secondary | ICD-10-CM | POA: Diagnosis not present

## 2020-11-22 DIAGNOSIS — G4700 Insomnia, unspecified: Secondary | ICD-10-CM | POA: Diagnosis not present

## 2020-11-22 DIAGNOSIS — R350 Frequency of micturition: Secondary | ICD-10-CM | POA: Diagnosis not present

## 2020-11-22 DIAGNOSIS — I4819 Other persistent atrial fibrillation: Secondary | ICD-10-CM | POA: Diagnosis not present

## 2020-11-22 DIAGNOSIS — I5030 Unspecified diastolic (congestive) heart failure: Secondary | ICD-10-CM | POA: Diagnosis not present

## 2020-11-22 DIAGNOSIS — Z6822 Body mass index (BMI) 22.0-22.9, adult: Secondary | ICD-10-CM | POA: Diagnosis not present

## 2020-11-22 DIAGNOSIS — E785 Hyperlipidemia, unspecified: Secondary | ICD-10-CM | POA: Diagnosis not present

## 2020-11-22 DIAGNOSIS — I70238 Atherosclerosis of native arteries of right leg with ulceration of other part of lower right leg: Secondary | ICD-10-CM | POA: Diagnosis not present

## 2020-11-22 DIAGNOSIS — L97819 Non-pressure chronic ulcer of other part of right lower leg with unspecified severity: Secondary | ICD-10-CM | POA: Diagnosis not present

## 2020-11-22 DIAGNOSIS — Z7901 Long term (current) use of anticoagulants: Secondary | ICD-10-CM | POA: Diagnosis not present

## 2020-11-22 DIAGNOSIS — L97829 Non-pressure chronic ulcer of other part of left lower leg with unspecified severity: Secondary | ICD-10-CM | POA: Diagnosis not present

## 2020-11-22 NOTE — Telephone Encounter (Signed)
Nelly Rout, patient's son, to check with wound care provider to recommend what dose of Vitamin C. Patient was appreciative.

## 2020-11-25 DIAGNOSIS — G4700 Insomnia, unspecified: Secondary | ICD-10-CM | POA: Diagnosis not present

## 2020-11-25 DIAGNOSIS — L97819 Non-pressure chronic ulcer of other part of right lower leg with unspecified severity: Secondary | ICD-10-CM | POA: Diagnosis not present

## 2020-11-25 DIAGNOSIS — N183 Chronic kidney disease, stage 3 unspecified: Secondary | ICD-10-CM | POA: Diagnosis not present

## 2020-11-25 DIAGNOSIS — E785 Hyperlipidemia, unspecified: Secondary | ICD-10-CM | POA: Diagnosis not present

## 2020-11-25 DIAGNOSIS — Z993 Dependence on wheelchair: Secondary | ICD-10-CM | POA: Diagnosis not present

## 2020-11-25 DIAGNOSIS — I4819 Other persistent atrial fibrillation: Secondary | ICD-10-CM | POA: Diagnosis not present

## 2020-11-25 DIAGNOSIS — Z6822 Body mass index (BMI) 22.0-22.9, adult: Secondary | ICD-10-CM | POA: Diagnosis not present

## 2020-11-25 DIAGNOSIS — Z87440 Personal history of urinary (tract) infections: Secondary | ICD-10-CM | POA: Diagnosis not present

## 2020-11-25 DIAGNOSIS — R7303 Prediabetes: Secondary | ICD-10-CM | POA: Diagnosis not present

## 2020-11-25 DIAGNOSIS — R350 Frequency of micturition: Secondary | ICD-10-CM | POA: Diagnosis not present

## 2020-11-25 DIAGNOSIS — I70238 Atherosclerosis of native arteries of right leg with ulceration of other part of lower right leg: Secondary | ICD-10-CM | POA: Diagnosis not present

## 2020-11-25 DIAGNOSIS — L97829 Non-pressure chronic ulcer of other part of left lower leg with unspecified severity: Secondary | ICD-10-CM | POA: Diagnosis not present

## 2020-11-25 DIAGNOSIS — I5030 Unspecified diastolic (congestive) heart failure: Secondary | ICD-10-CM | POA: Diagnosis not present

## 2020-11-25 DIAGNOSIS — Z4800 Encounter for change or removal of nonsurgical wound dressing: Secondary | ICD-10-CM | POA: Diagnosis not present

## 2020-11-25 DIAGNOSIS — I70248 Atherosclerosis of native arteries of left leg with ulceration of other part of lower left leg: Secondary | ICD-10-CM | POA: Diagnosis not present

## 2020-11-25 DIAGNOSIS — Z7901 Long term (current) use of anticoagulants: Secondary | ICD-10-CM | POA: Diagnosis not present

## 2020-11-26 DIAGNOSIS — Z7901 Long term (current) use of anticoagulants: Secondary | ICD-10-CM | POA: Diagnosis not present

## 2020-11-26 DIAGNOSIS — I70238 Atherosclerosis of native arteries of right leg with ulceration of other part of lower right leg: Secondary | ICD-10-CM | POA: Diagnosis not present

## 2020-11-26 DIAGNOSIS — I4819 Other persistent atrial fibrillation: Secondary | ICD-10-CM | POA: Diagnosis not present

## 2020-11-26 DIAGNOSIS — E785 Hyperlipidemia, unspecified: Secondary | ICD-10-CM | POA: Diagnosis not present

## 2020-11-26 DIAGNOSIS — L97819 Non-pressure chronic ulcer of other part of right lower leg with unspecified severity: Secondary | ICD-10-CM | POA: Diagnosis not present

## 2020-11-26 DIAGNOSIS — I70248 Atherosclerosis of native arteries of left leg with ulceration of other part of lower left leg: Secondary | ICD-10-CM | POA: Diagnosis not present

## 2020-11-26 DIAGNOSIS — R7303 Prediabetes: Secondary | ICD-10-CM | POA: Diagnosis not present

## 2020-11-26 DIAGNOSIS — Z993 Dependence on wheelchair: Secondary | ICD-10-CM | POA: Diagnosis not present

## 2020-11-26 DIAGNOSIS — R350 Frequency of micturition: Secondary | ICD-10-CM | POA: Diagnosis not present

## 2020-11-26 DIAGNOSIS — Z87440 Personal history of urinary (tract) infections: Secondary | ICD-10-CM | POA: Diagnosis not present

## 2020-11-26 DIAGNOSIS — Z6822 Body mass index (BMI) 22.0-22.9, adult: Secondary | ICD-10-CM | POA: Diagnosis not present

## 2020-11-26 DIAGNOSIS — L97829 Non-pressure chronic ulcer of other part of left lower leg with unspecified severity: Secondary | ICD-10-CM | POA: Diagnosis not present

## 2020-11-26 DIAGNOSIS — N183 Chronic kidney disease, stage 3 unspecified: Secondary | ICD-10-CM | POA: Diagnosis not present

## 2020-11-26 DIAGNOSIS — I5030 Unspecified diastolic (congestive) heart failure: Secondary | ICD-10-CM | POA: Diagnosis not present

## 2020-11-26 DIAGNOSIS — G4700 Insomnia, unspecified: Secondary | ICD-10-CM | POA: Diagnosis not present

## 2020-11-26 DIAGNOSIS — Z4800 Encounter for change or removal of nonsurgical wound dressing: Secondary | ICD-10-CM | POA: Diagnosis not present

## 2020-11-27 DIAGNOSIS — I70248 Atherosclerosis of native arteries of left leg with ulceration of other part of lower left leg: Secondary | ICD-10-CM | POA: Diagnosis not present

## 2020-11-27 DIAGNOSIS — N183 Chronic kidney disease, stage 3 unspecified: Secondary | ICD-10-CM | POA: Diagnosis not present

## 2020-11-27 DIAGNOSIS — R7303 Prediabetes: Secondary | ICD-10-CM | POA: Diagnosis not present

## 2020-11-27 DIAGNOSIS — I4819 Other persistent atrial fibrillation: Secondary | ICD-10-CM | POA: Diagnosis not present

## 2020-11-27 DIAGNOSIS — L97829 Non-pressure chronic ulcer of other part of left lower leg with unspecified severity: Secondary | ICD-10-CM | POA: Diagnosis not present

## 2020-11-27 DIAGNOSIS — I5030 Unspecified diastolic (congestive) heart failure: Secondary | ICD-10-CM | POA: Diagnosis not present

## 2020-11-27 DIAGNOSIS — Z993 Dependence on wheelchair: Secondary | ICD-10-CM | POA: Diagnosis not present

## 2020-11-27 DIAGNOSIS — Z6822 Body mass index (BMI) 22.0-22.9, adult: Secondary | ICD-10-CM | POA: Diagnosis not present

## 2020-11-27 DIAGNOSIS — Z87440 Personal history of urinary (tract) infections: Secondary | ICD-10-CM | POA: Diagnosis not present

## 2020-11-27 DIAGNOSIS — E785 Hyperlipidemia, unspecified: Secondary | ICD-10-CM | POA: Diagnosis not present

## 2020-11-27 DIAGNOSIS — Z4800 Encounter for change or removal of nonsurgical wound dressing: Secondary | ICD-10-CM | POA: Diagnosis not present

## 2020-11-27 DIAGNOSIS — L97819 Non-pressure chronic ulcer of other part of right lower leg with unspecified severity: Secondary | ICD-10-CM | POA: Diagnosis not present

## 2020-11-27 DIAGNOSIS — Z7901 Long term (current) use of anticoagulants: Secondary | ICD-10-CM | POA: Diagnosis not present

## 2020-11-27 DIAGNOSIS — R350 Frequency of micturition: Secondary | ICD-10-CM | POA: Diagnosis not present

## 2020-11-27 DIAGNOSIS — I70238 Atherosclerosis of native arteries of right leg with ulceration of other part of lower right leg: Secondary | ICD-10-CM | POA: Diagnosis not present

## 2020-11-27 DIAGNOSIS — G4700 Insomnia, unspecified: Secondary | ICD-10-CM | POA: Diagnosis not present

## 2020-11-28 DIAGNOSIS — Z20822 Contact with and (suspected) exposure to covid-19: Secondary | ICD-10-CM | POA: Diagnosis not present

## 2020-11-29 DIAGNOSIS — L97829 Non-pressure chronic ulcer of other part of left lower leg with unspecified severity: Secondary | ICD-10-CM | POA: Diagnosis not present

## 2020-11-29 DIAGNOSIS — L97819 Non-pressure chronic ulcer of other part of right lower leg with unspecified severity: Secondary | ICD-10-CM | POA: Diagnosis not present

## 2020-11-29 DIAGNOSIS — I4819 Other persistent atrial fibrillation: Secondary | ICD-10-CM | POA: Diagnosis not present

## 2020-11-29 DIAGNOSIS — E785 Hyperlipidemia, unspecified: Secondary | ICD-10-CM | POA: Diagnosis not present

## 2020-11-29 DIAGNOSIS — N183 Chronic kidney disease, stage 3 unspecified: Secondary | ICD-10-CM | POA: Diagnosis not present

## 2020-11-29 DIAGNOSIS — Z4800 Encounter for change or removal of nonsurgical wound dressing: Secondary | ICD-10-CM | POA: Diagnosis not present

## 2020-11-29 DIAGNOSIS — I5030 Unspecified diastolic (congestive) heart failure: Secondary | ICD-10-CM | POA: Diagnosis not present

## 2020-11-29 DIAGNOSIS — G4700 Insomnia, unspecified: Secondary | ICD-10-CM | POA: Diagnosis not present

## 2020-11-29 DIAGNOSIS — Z87440 Personal history of urinary (tract) infections: Secondary | ICD-10-CM | POA: Diagnosis not present

## 2020-11-29 DIAGNOSIS — Z993 Dependence on wheelchair: Secondary | ICD-10-CM | POA: Diagnosis not present

## 2020-11-29 DIAGNOSIS — I70248 Atherosclerosis of native arteries of left leg with ulceration of other part of lower left leg: Secondary | ICD-10-CM | POA: Diagnosis not present

## 2020-11-29 DIAGNOSIS — R7303 Prediabetes: Secondary | ICD-10-CM | POA: Diagnosis not present

## 2020-11-29 DIAGNOSIS — I70238 Atherosclerosis of native arteries of right leg with ulceration of other part of lower right leg: Secondary | ICD-10-CM | POA: Diagnosis not present

## 2020-11-29 DIAGNOSIS — Z7901 Long term (current) use of anticoagulants: Secondary | ICD-10-CM | POA: Diagnosis not present

## 2020-11-29 DIAGNOSIS — R350 Frequency of micturition: Secondary | ICD-10-CM | POA: Diagnosis not present

## 2020-11-29 DIAGNOSIS — Z6822 Body mass index (BMI) 22.0-22.9, adult: Secondary | ICD-10-CM | POA: Diagnosis not present

## 2020-12-04 DIAGNOSIS — Z7901 Long term (current) use of anticoagulants: Secondary | ICD-10-CM | POA: Diagnosis not present

## 2020-12-04 DIAGNOSIS — Z87440 Personal history of urinary (tract) infections: Secondary | ICD-10-CM | POA: Diagnosis not present

## 2020-12-04 DIAGNOSIS — Z6822 Body mass index (BMI) 22.0-22.9, adult: Secondary | ICD-10-CM | POA: Diagnosis not present

## 2020-12-04 DIAGNOSIS — G4700 Insomnia, unspecified: Secondary | ICD-10-CM | POA: Diagnosis not present

## 2020-12-04 DIAGNOSIS — L97819 Non-pressure chronic ulcer of other part of right lower leg with unspecified severity: Secondary | ICD-10-CM | POA: Diagnosis not present

## 2020-12-04 DIAGNOSIS — Z4800 Encounter for change or removal of nonsurgical wound dressing: Secondary | ICD-10-CM | POA: Diagnosis not present

## 2020-12-04 DIAGNOSIS — I4819 Other persistent atrial fibrillation: Secondary | ICD-10-CM | POA: Diagnosis not present

## 2020-12-04 DIAGNOSIS — E785 Hyperlipidemia, unspecified: Secondary | ICD-10-CM | POA: Diagnosis not present

## 2020-12-04 DIAGNOSIS — L97829 Non-pressure chronic ulcer of other part of left lower leg with unspecified severity: Secondary | ICD-10-CM | POA: Diagnosis not present

## 2020-12-04 DIAGNOSIS — R350 Frequency of micturition: Secondary | ICD-10-CM | POA: Diagnosis not present

## 2020-12-04 DIAGNOSIS — N183 Chronic kidney disease, stage 3 unspecified: Secondary | ICD-10-CM | POA: Diagnosis not present

## 2020-12-04 DIAGNOSIS — I70238 Atherosclerosis of native arteries of right leg with ulceration of other part of lower right leg: Secondary | ICD-10-CM | POA: Diagnosis not present

## 2020-12-04 DIAGNOSIS — R7303 Prediabetes: Secondary | ICD-10-CM | POA: Diagnosis not present

## 2020-12-04 DIAGNOSIS — I70248 Atherosclerosis of native arteries of left leg with ulceration of other part of lower left leg: Secondary | ICD-10-CM | POA: Diagnosis not present

## 2020-12-04 DIAGNOSIS — I5030 Unspecified diastolic (congestive) heart failure: Secondary | ICD-10-CM | POA: Diagnosis not present

## 2020-12-04 DIAGNOSIS — Z993 Dependence on wheelchair: Secondary | ICD-10-CM | POA: Diagnosis not present

## 2020-12-05 ENCOUNTER — Ambulatory Visit: Payer: Medicare Other | Admitting: Internal Medicine

## 2020-12-05 NOTE — Progress Notes (Deleted)
Follow-up Outpatient Visit Date: 12/05/2020  Primary Care Provider: Philmore Pali, NP [place] [address]  Chief Complaint: ***  HPI:  Kristen Graham is a 87 y.o. female with history of permanent atrial fibrillation, chronic HFpEF, heart disease with at least moderate right popliteal artery stenosis, hypertension, hyperlipidemia, prediabetes, and chronic kidney disease stage III, who presents for follow-up of atrial fibrillation, HFpEF, and PAD, who presents for follow-up of atrial fibrillation and chronic HFpEF.  I last saw her in early December, at which time she seemed to be doing a little better with less leg swelling.  Her son remained concerned about intermittent lethargy and possible excess use of alprazolam.  I encouraged consultation with her PCP in the hopes of discontinuing alprazolam altogether.  Due to soft blood pressures, furosemide, potassium, and metoprolol were all decreased.  Kristen Graham was seen twice the following week in the ED for falls and ultimately diagnosed with pelvic fractures.  --------------------------------------------------------------------------------------------------  Cardiovascular History & Procedures: Cardiovascular Problems:  Atrial fibrillation  HFpEF  Mitral, tricuspid, and aortic regurgitation  Risk Factors:  Hypertension, hyperlipidemia, prediabetes, and age > 26  Cath/PCI:  None  CV Surgery:  None  EP Procedures and Devices:  None  Non-Invasive Evaluation(s):  Bilateral lower extremity arterial Dopplers (11/03/2019): Unable to obtain accurate ABI/TBI's. Doppler examination indicates moderate (50 to 74%) right popliteal artery stenosis. Occlusion to high-grade stenosis in mid to distal right posterior tibial artery noted.  TTE (09/12/2019): Normal LV size. LVEF 55-60% with moderate focal thickening of the basal septum. Normal RV systolic function with mild dilation. Mild to moderate left atrial  enlargement. Mild right atrial enlargement. Moderate mitral and severe tricuspid regurgitation. Mild aortic regurgitation. Elevated central venous pressure. Mild pulmonary hypertension.   Recent CV Pertinent Labs: Lab Results  Component Value Date   CHOL 132 11/07/2019   HDL 42 11/07/2019   LDLCALC 76 11/07/2019   TRIG 71 11/07/2019   CHOLHDL 3.1 11/07/2019   K 4.5 11/01/2020   BUN 41 (H) 11/01/2020   BUN 29 (H) 10/23/2020   CREATININE 1.66 (H) 11/01/2020    Past medical and surgical history were reviewed and updated in EPIC.  No outpatient medications have been marked as taking for the 12/05/20 encounter (Appointment) with End, Harrell Gave, MD.    Allergies: Other and Sulfa antibiotics  Social History   Tobacco Use  . Smoking status: Never Smoker  . Smokeless tobacco: Never Used  Vaping Use  . Vaping Use: Never used  Substance Use Topics  . Alcohol use: No  . Drug use: Never    Family History  Problem Relation Age of Onset  . Stroke Mother   . Arrhythmia Son     Review of Systems: A 12-system review of systems was performed and was negative except as noted in the HPI.  --------------------------------------------------------------------------------------------------  Physical Exam: There were no vitals taken for this visit.  General:  NAD. Neck: No JVD or HJR. Lungs: Clear to auscultation bilaterally without wheezes or crackles. Heart: Regular rate and rhythm without murmurs, rubs, or gallops. Abdomen: Soft, nontender, nondistended. Extremities: No lower extremity edema.  EKG:  ***  Lab Results  Component Value Date   WBC 11.9 (H) 11/01/2020   HGB 14.6 11/01/2020   HCT 44.7 11/01/2020   MCV 96.8 11/01/2020   PLT 204 11/01/2020    Lab Results  Component Value Date   NA 132 (L) 11/01/2020   K 4.5 11/01/2020   CL 97 (L) 11/01/2020  CO2 24 11/01/2020   BUN 41 (H) 11/01/2020   CREATININE 1.66 (H) 11/01/2020   GLUCOSE 98 11/01/2020   ALT 11  11/01/2020    Lab Results  Component Value Date   CHOL 132 11/07/2019   HDL 42 11/07/2019   LDLCALC 76 11/07/2019   TRIG 71 11/07/2019   CHOLHDL 3.1 11/07/2019    --------------------------------------------------------------------------------------------------  ASSESSMENT AND PLAN: Harrell Gave End, MD 12/05/2020 7:23 AM

## 2020-12-06 DIAGNOSIS — L97829 Non-pressure chronic ulcer of other part of left lower leg with unspecified severity: Secondary | ICD-10-CM | POA: Diagnosis not present

## 2020-12-06 DIAGNOSIS — R350 Frequency of micturition: Secondary | ICD-10-CM | POA: Diagnosis not present

## 2020-12-06 DIAGNOSIS — Z7901 Long term (current) use of anticoagulants: Secondary | ICD-10-CM | POA: Diagnosis not present

## 2020-12-06 DIAGNOSIS — Z993 Dependence on wheelchair: Secondary | ICD-10-CM | POA: Diagnosis not present

## 2020-12-06 DIAGNOSIS — G4700 Insomnia, unspecified: Secondary | ICD-10-CM | POA: Diagnosis not present

## 2020-12-06 DIAGNOSIS — I4819 Other persistent atrial fibrillation: Secondary | ICD-10-CM | POA: Diagnosis not present

## 2020-12-06 DIAGNOSIS — E785 Hyperlipidemia, unspecified: Secondary | ICD-10-CM | POA: Diagnosis not present

## 2020-12-06 DIAGNOSIS — R7303 Prediabetes: Secondary | ICD-10-CM | POA: Diagnosis not present

## 2020-12-06 DIAGNOSIS — N183 Chronic kidney disease, stage 3 unspecified: Secondary | ICD-10-CM | POA: Diagnosis not present

## 2020-12-06 DIAGNOSIS — I70248 Atherosclerosis of native arteries of left leg with ulceration of other part of lower left leg: Secondary | ICD-10-CM | POA: Diagnosis not present

## 2020-12-06 DIAGNOSIS — Z4800 Encounter for change or removal of nonsurgical wound dressing: Secondary | ICD-10-CM | POA: Diagnosis not present

## 2020-12-06 DIAGNOSIS — L97819 Non-pressure chronic ulcer of other part of right lower leg with unspecified severity: Secondary | ICD-10-CM | POA: Diagnosis not present

## 2020-12-06 DIAGNOSIS — Z6822 Body mass index (BMI) 22.0-22.9, adult: Secondary | ICD-10-CM | POA: Diagnosis not present

## 2020-12-06 DIAGNOSIS — Z87440 Personal history of urinary (tract) infections: Secondary | ICD-10-CM | POA: Diagnosis not present

## 2020-12-06 DIAGNOSIS — I5030 Unspecified diastolic (congestive) heart failure: Secondary | ICD-10-CM | POA: Diagnosis not present

## 2020-12-06 DIAGNOSIS — I70238 Atherosclerosis of native arteries of right leg with ulceration of other part of lower right leg: Secondary | ICD-10-CM | POA: Diagnosis not present

## 2020-12-09 DIAGNOSIS — Z993 Dependence on wheelchair: Secondary | ICD-10-CM | POA: Diagnosis not present

## 2020-12-09 DIAGNOSIS — I70238 Atherosclerosis of native arteries of right leg with ulceration of other part of lower right leg: Secondary | ICD-10-CM | POA: Diagnosis not present

## 2020-12-09 DIAGNOSIS — Z4800 Encounter for change or removal of nonsurgical wound dressing: Secondary | ICD-10-CM | POA: Diagnosis not present

## 2020-12-09 DIAGNOSIS — E785 Hyperlipidemia, unspecified: Secondary | ICD-10-CM | POA: Diagnosis not present

## 2020-12-09 DIAGNOSIS — I70248 Atherosclerosis of native arteries of left leg with ulceration of other part of lower left leg: Secondary | ICD-10-CM | POA: Diagnosis not present

## 2020-12-09 DIAGNOSIS — Z6822 Body mass index (BMI) 22.0-22.9, adult: Secondary | ICD-10-CM | POA: Diagnosis not present

## 2020-12-09 DIAGNOSIS — R350 Frequency of micturition: Secondary | ICD-10-CM | POA: Diagnosis not present

## 2020-12-09 DIAGNOSIS — I5030 Unspecified diastolic (congestive) heart failure: Secondary | ICD-10-CM | POA: Diagnosis not present

## 2020-12-09 DIAGNOSIS — Z7901 Long term (current) use of anticoagulants: Secondary | ICD-10-CM | POA: Diagnosis not present

## 2020-12-09 DIAGNOSIS — N183 Chronic kidney disease, stage 3 unspecified: Secondary | ICD-10-CM | POA: Diagnosis not present

## 2020-12-09 DIAGNOSIS — L97829 Non-pressure chronic ulcer of other part of left lower leg with unspecified severity: Secondary | ICD-10-CM | POA: Diagnosis not present

## 2020-12-09 DIAGNOSIS — I4819 Other persistent atrial fibrillation: Secondary | ICD-10-CM | POA: Diagnosis not present

## 2020-12-09 DIAGNOSIS — Z87440 Personal history of urinary (tract) infections: Secondary | ICD-10-CM | POA: Diagnosis not present

## 2020-12-09 DIAGNOSIS — R7303 Prediabetes: Secondary | ICD-10-CM | POA: Diagnosis not present

## 2020-12-09 DIAGNOSIS — L97819 Non-pressure chronic ulcer of other part of right lower leg with unspecified severity: Secondary | ICD-10-CM | POA: Diagnosis not present

## 2020-12-09 DIAGNOSIS — G4700 Insomnia, unspecified: Secondary | ICD-10-CM | POA: Diagnosis not present

## 2020-12-11 DIAGNOSIS — E785 Hyperlipidemia, unspecified: Secondary | ICD-10-CM | POA: Diagnosis not present

## 2020-12-11 DIAGNOSIS — G4700 Insomnia, unspecified: Secondary | ICD-10-CM | POA: Diagnosis not present

## 2020-12-11 DIAGNOSIS — R350 Frequency of micturition: Secondary | ICD-10-CM | POA: Diagnosis not present

## 2020-12-11 DIAGNOSIS — L97829 Non-pressure chronic ulcer of other part of left lower leg with unspecified severity: Secondary | ICD-10-CM | POA: Diagnosis not present

## 2020-12-11 DIAGNOSIS — R7303 Prediabetes: Secondary | ICD-10-CM | POA: Diagnosis not present

## 2020-12-11 DIAGNOSIS — I70238 Atherosclerosis of native arteries of right leg with ulceration of other part of lower right leg: Secondary | ICD-10-CM | POA: Diagnosis not present

## 2020-12-11 DIAGNOSIS — I4819 Other persistent atrial fibrillation: Secondary | ICD-10-CM | POA: Diagnosis not present

## 2020-12-11 DIAGNOSIS — Z4800 Encounter for change or removal of nonsurgical wound dressing: Secondary | ICD-10-CM | POA: Diagnosis not present

## 2020-12-11 DIAGNOSIS — N183 Chronic kidney disease, stage 3 unspecified: Secondary | ICD-10-CM | POA: Diagnosis not present

## 2020-12-11 DIAGNOSIS — Z7901 Long term (current) use of anticoagulants: Secondary | ICD-10-CM | POA: Diagnosis not present

## 2020-12-11 DIAGNOSIS — Z6822 Body mass index (BMI) 22.0-22.9, adult: Secondary | ICD-10-CM | POA: Diagnosis not present

## 2020-12-11 DIAGNOSIS — L97819 Non-pressure chronic ulcer of other part of right lower leg with unspecified severity: Secondary | ICD-10-CM | POA: Diagnosis not present

## 2020-12-11 DIAGNOSIS — Z993 Dependence on wheelchair: Secondary | ICD-10-CM | POA: Diagnosis not present

## 2020-12-11 DIAGNOSIS — Z87440 Personal history of urinary (tract) infections: Secondary | ICD-10-CM | POA: Diagnosis not present

## 2020-12-11 DIAGNOSIS — I70248 Atherosclerosis of native arteries of left leg with ulceration of other part of lower left leg: Secondary | ICD-10-CM | POA: Diagnosis not present

## 2020-12-11 DIAGNOSIS — I5030 Unspecified diastolic (congestive) heart failure: Secondary | ICD-10-CM | POA: Diagnosis not present

## 2020-12-13 DIAGNOSIS — E785 Hyperlipidemia, unspecified: Secondary | ICD-10-CM | POA: Diagnosis not present

## 2020-12-13 DIAGNOSIS — R350 Frequency of micturition: Secondary | ICD-10-CM | POA: Diagnosis not present

## 2020-12-13 DIAGNOSIS — N183 Chronic kidney disease, stage 3 unspecified: Secondary | ICD-10-CM | POA: Diagnosis not present

## 2020-12-13 DIAGNOSIS — R7303 Prediabetes: Secondary | ICD-10-CM | POA: Diagnosis not present

## 2020-12-13 DIAGNOSIS — I4819 Other persistent atrial fibrillation: Secondary | ICD-10-CM | POA: Diagnosis not present

## 2020-12-13 DIAGNOSIS — I70248 Atherosclerosis of native arteries of left leg with ulceration of other part of lower left leg: Secondary | ICD-10-CM | POA: Diagnosis not present

## 2020-12-13 DIAGNOSIS — Z87440 Personal history of urinary (tract) infections: Secondary | ICD-10-CM | POA: Diagnosis not present

## 2020-12-13 DIAGNOSIS — Z6822 Body mass index (BMI) 22.0-22.9, adult: Secondary | ICD-10-CM | POA: Diagnosis not present

## 2020-12-13 DIAGNOSIS — L97829 Non-pressure chronic ulcer of other part of left lower leg with unspecified severity: Secondary | ICD-10-CM | POA: Diagnosis not present

## 2020-12-13 DIAGNOSIS — I5030 Unspecified diastolic (congestive) heart failure: Secondary | ICD-10-CM | POA: Diagnosis not present

## 2020-12-13 DIAGNOSIS — Z993 Dependence on wheelchair: Secondary | ICD-10-CM | POA: Diagnosis not present

## 2020-12-13 DIAGNOSIS — L97819 Non-pressure chronic ulcer of other part of right lower leg with unspecified severity: Secondary | ICD-10-CM | POA: Diagnosis not present

## 2020-12-13 DIAGNOSIS — I70238 Atherosclerosis of native arteries of right leg with ulceration of other part of lower right leg: Secondary | ICD-10-CM | POA: Diagnosis not present

## 2020-12-13 DIAGNOSIS — Z4800 Encounter for change or removal of nonsurgical wound dressing: Secondary | ICD-10-CM | POA: Diagnosis not present

## 2020-12-13 DIAGNOSIS — Z7901 Long term (current) use of anticoagulants: Secondary | ICD-10-CM | POA: Diagnosis not present

## 2020-12-13 DIAGNOSIS — G4700 Insomnia, unspecified: Secondary | ICD-10-CM | POA: Diagnosis not present

## 2020-12-16 DIAGNOSIS — I4819 Other persistent atrial fibrillation: Secondary | ICD-10-CM | POA: Diagnosis not present

## 2020-12-16 DIAGNOSIS — G4700 Insomnia, unspecified: Secondary | ICD-10-CM | POA: Diagnosis not present

## 2020-12-16 DIAGNOSIS — Z993 Dependence on wheelchair: Secondary | ICD-10-CM | POA: Diagnosis not present

## 2020-12-16 DIAGNOSIS — R7303 Prediabetes: Secondary | ICD-10-CM | POA: Diagnosis not present

## 2020-12-16 DIAGNOSIS — Z87440 Personal history of urinary (tract) infections: Secondary | ICD-10-CM | POA: Diagnosis not present

## 2020-12-16 DIAGNOSIS — L97819 Non-pressure chronic ulcer of other part of right lower leg with unspecified severity: Secondary | ICD-10-CM | POA: Diagnosis not present

## 2020-12-16 DIAGNOSIS — I70238 Atherosclerosis of native arteries of right leg with ulceration of other part of lower right leg: Secondary | ICD-10-CM | POA: Diagnosis not present

## 2020-12-16 DIAGNOSIS — Z6822 Body mass index (BMI) 22.0-22.9, adult: Secondary | ICD-10-CM | POA: Diagnosis not present

## 2020-12-16 DIAGNOSIS — L97829 Non-pressure chronic ulcer of other part of left lower leg with unspecified severity: Secondary | ICD-10-CM | POA: Diagnosis not present

## 2020-12-16 DIAGNOSIS — R350 Frequency of micturition: Secondary | ICD-10-CM | POA: Diagnosis not present

## 2020-12-16 DIAGNOSIS — I70248 Atherosclerosis of native arteries of left leg with ulceration of other part of lower left leg: Secondary | ICD-10-CM | POA: Diagnosis not present

## 2020-12-16 DIAGNOSIS — Z4800 Encounter for change or removal of nonsurgical wound dressing: Secondary | ICD-10-CM | POA: Diagnosis not present

## 2020-12-16 DIAGNOSIS — I5030 Unspecified diastolic (congestive) heart failure: Secondary | ICD-10-CM | POA: Diagnosis not present

## 2020-12-16 DIAGNOSIS — E785 Hyperlipidemia, unspecified: Secondary | ICD-10-CM | POA: Diagnosis not present

## 2020-12-16 DIAGNOSIS — Z7901 Long term (current) use of anticoagulants: Secondary | ICD-10-CM | POA: Diagnosis not present

## 2020-12-16 DIAGNOSIS — N183 Chronic kidney disease, stage 3 unspecified: Secondary | ICD-10-CM | POA: Diagnosis not present

## 2020-12-18 ENCOUNTER — Ambulatory Visit: Payer: Medicare Other | Admitting: Internal Medicine

## 2020-12-18 DIAGNOSIS — Z6822 Body mass index (BMI) 22.0-22.9, adult: Secondary | ICD-10-CM | POA: Diagnosis not present

## 2020-12-18 DIAGNOSIS — L97829 Non-pressure chronic ulcer of other part of left lower leg with unspecified severity: Secondary | ICD-10-CM | POA: Diagnosis not present

## 2020-12-18 DIAGNOSIS — Z993 Dependence on wheelchair: Secondary | ICD-10-CM | POA: Diagnosis not present

## 2020-12-18 DIAGNOSIS — R7303 Prediabetes: Secondary | ICD-10-CM | POA: Diagnosis not present

## 2020-12-18 DIAGNOSIS — N183 Chronic kidney disease, stage 3 unspecified: Secondary | ICD-10-CM | POA: Diagnosis not present

## 2020-12-18 DIAGNOSIS — I70238 Atherosclerosis of native arteries of right leg with ulceration of other part of lower right leg: Secondary | ICD-10-CM | POA: Diagnosis not present

## 2020-12-18 DIAGNOSIS — I5030 Unspecified diastolic (congestive) heart failure: Secondary | ICD-10-CM | POA: Diagnosis not present

## 2020-12-18 DIAGNOSIS — G4700 Insomnia, unspecified: Secondary | ICD-10-CM | POA: Diagnosis not present

## 2020-12-18 DIAGNOSIS — L97519 Non-pressure chronic ulcer of other part of right foot with unspecified severity: Secondary | ICD-10-CM | POA: Diagnosis not present

## 2020-12-18 DIAGNOSIS — Z87440 Personal history of urinary (tract) infections: Secondary | ICD-10-CM | POA: Diagnosis not present

## 2020-12-18 DIAGNOSIS — R350 Frequency of micturition: Secondary | ICD-10-CM | POA: Diagnosis not present

## 2020-12-18 DIAGNOSIS — Z4800 Encounter for change or removal of nonsurgical wound dressing: Secondary | ICD-10-CM | POA: Diagnosis not present

## 2020-12-18 DIAGNOSIS — I4819 Other persistent atrial fibrillation: Secondary | ICD-10-CM | POA: Diagnosis not present

## 2020-12-18 DIAGNOSIS — Z7901 Long term (current) use of anticoagulants: Secondary | ICD-10-CM | POA: Diagnosis not present

## 2020-12-18 DIAGNOSIS — E785 Hyperlipidemia, unspecified: Secondary | ICD-10-CM | POA: Diagnosis not present

## 2020-12-18 DIAGNOSIS — I70248 Atherosclerosis of native arteries of left leg with ulceration of other part of lower left leg: Secondary | ICD-10-CM | POA: Diagnosis not present

## 2020-12-18 DIAGNOSIS — L97819 Non-pressure chronic ulcer of other part of right lower leg with unspecified severity: Secondary | ICD-10-CM | POA: Diagnosis not present

## 2020-12-19 DIAGNOSIS — L97211 Non-pressure chronic ulcer of right calf limited to breakdown of skin: Secondary | ICD-10-CM | POA: Diagnosis not present

## 2020-12-19 DIAGNOSIS — L97221 Non-pressure chronic ulcer of left calf limited to breakdown of skin: Secondary | ICD-10-CM | POA: Diagnosis not present

## 2020-12-20 DIAGNOSIS — Z4800 Encounter for change or removal of nonsurgical wound dressing: Secondary | ICD-10-CM | POA: Diagnosis not present

## 2020-12-20 DIAGNOSIS — L97519 Non-pressure chronic ulcer of other part of right foot with unspecified severity: Secondary | ICD-10-CM | POA: Diagnosis not present

## 2020-12-20 DIAGNOSIS — Z7901 Long term (current) use of anticoagulants: Secondary | ICD-10-CM | POA: Diagnosis not present

## 2020-12-20 DIAGNOSIS — I4819 Other persistent atrial fibrillation: Secondary | ICD-10-CM | POA: Diagnosis not present

## 2020-12-20 DIAGNOSIS — Z6822 Body mass index (BMI) 22.0-22.9, adult: Secondary | ICD-10-CM | POA: Diagnosis not present

## 2020-12-20 DIAGNOSIS — G4700 Insomnia, unspecified: Secondary | ICD-10-CM | POA: Diagnosis not present

## 2020-12-20 DIAGNOSIS — I5030 Unspecified diastolic (congestive) heart failure: Secondary | ICD-10-CM | POA: Diagnosis not present

## 2020-12-20 DIAGNOSIS — L97819 Non-pressure chronic ulcer of other part of right lower leg with unspecified severity: Secondary | ICD-10-CM | POA: Diagnosis not present

## 2020-12-20 DIAGNOSIS — Z87440 Personal history of urinary (tract) infections: Secondary | ICD-10-CM | POA: Diagnosis not present

## 2020-12-20 DIAGNOSIS — Z993 Dependence on wheelchair: Secondary | ICD-10-CM | POA: Diagnosis not present

## 2020-12-20 DIAGNOSIS — L97829 Non-pressure chronic ulcer of other part of left lower leg with unspecified severity: Secondary | ICD-10-CM | POA: Diagnosis not present

## 2020-12-20 DIAGNOSIS — E785 Hyperlipidemia, unspecified: Secondary | ICD-10-CM | POA: Diagnosis not present

## 2020-12-20 DIAGNOSIS — N183 Chronic kidney disease, stage 3 unspecified: Secondary | ICD-10-CM | POA: Diagnosis not present

## 2020-12-20 DIAGNOSIS — R350 Frequency of micturition: Secondary | ICD-10-CM | POA: Diagnosis not present

## 2020-12-20 DIAGNOSIS — I70238 Atherosclerosis of native arteries of right leg with ulceration of other part of lower right leg: Secondary | ICD-10-CM | POA: Diagnosis not present

## 2020-12-20 DIAGNOSIS — R7303 Prediabetes: Secondary | ICD-10-CM | POA: Diagnosis not present

## 2020-12-20 DIAGNOSIS — I70248 Atherosclerosis of native arteries of left leg with ulceration of other part of lower left leg: Secondary | ICD-10-CM | POA: Diagnosis not present

## 2020-12-23 DIAGNOSIS — R7303 Prediabetes: Secondary | ICD-10-CM | POA: Diagnosis not present

## 2020-12-23 DIAGNOSIS — I70238 Atherosclerosis of native arteries of right leg with ulceration of other part of lower right leg: Secondary | ICD-10-CM | POA: Diagnosis not present

## 2020-12-23 DIAGNOSIS — G4700 Insomnia, unspecified: Secondary | ICD-10-CM | POA: Diagnosis not present

## 2020-12-23 DIAGNOSIS — L97519 Non-pressure chronic ulcer of other part of right foot with unspecified severity: Secondary | ICD-10-CM | POA: Diagnosis not present

## 2020-12-23 DIAGNOSIS — I70248 Atherosclerosis of native arteries of left leg with ulceration of other part of lower left leg: Secondary | ICD-10-CM | POA: Diagnosis not present

## 2020-12-23 DIAGNOSIS — Z7901 Long term (current) use of anticoagulants: Secondary | ICD-10-CM | POA: Diagnosis not present

## 2020-12-23 DIAGNOSIS — L97819 Non-pressure chronic ulcer of other part of right lower leg with unspecified severity: Secondary | ICD-10-CM | POA: Diagnosis not present

## 2020-12-23 DIAGNOSIS — N183 Chronic kidney disease, stage 3 unspecified: Secondary | ICD-10-CM | POA: Diagnosis not present

## 2020-12-23 DIAGNOSIS — Z6822 Body mass index (BMI) 22.0-22.9, adult: Secondary | ICD-10-CM | POA: Diagnosis not present

## 2020-12-23 DIAGNOSIS — L97829 Non-pressure chronic ulcer of other part of left lower leg with unspecified severity: Secondary | ICD-10-CM | POA: Diagnosis not present

## 2020-12-23 DIAGNOSIS — Z993 Dependence on wheelchair: Secondary | ICD-10-CM | POA: Diagnosis not present

## 2020-12-23 DIAGNOSIS — I4819 Other persistent atrial fibrillation: Secondary | ICD-10-CM | POA: Diagnosis not present

## 2020-12-23 DIAGNOSIS — R350 Frequency of micturition: Secondary | ICD-10-CM | POA: Diagnosis not present

## 2020-12-23 DIAGNOSIS — Z87440 Personal history of urinary (tract) infections: Secondary | ICD-10-CM | POA: Diagnosis not present

## 2020-12-23 DIAGNOSIS — I5030 Unspecified diastolic (congestive) heart failure: Secondary | ICD-10-CM | POA: Diagnosis not present

## 2020-12-23 DIAGNOSIS — E785 Hyperlipidemia, unspecified: Secondary | ICD-10-CM | POA: Diagnosis not present

## 2020-12-23 DIAGNOSIS — Z4800 Encounter for change or removal of nonsurgical wound dressing: Secondary | ICD-10-CM | POA: Diagnosis not present

## 2020-12-25 DIAGNOSIS — R7303 Prediabetes: Secondary | ICD-10-CM | POA: Diagnosis not present

## 2020-12-25 DIAGNOSIS — Z6822 Body mass index (BMI) 22.0-22.9, adult: Secondary | ICD-10-CM | POA: Diagnosis not present

## 2020-12-25 DIAGNOSIS — N183 Chronic kidney disease, stage 3 unspecified: Secondary | ICD-10-CM | POA: Diagnosis not present

## 2020-12-25 DIAGNOSIS — Z20822 Contact with and (suspected) exposure to covid-19: Secondary | ICD-10-CM | POA: Diagnosis not present

## 2020-12-25 DIAGNOSIS — I70238 Atherosclerosis of native arteries of right leg with ulceration of other part of lower right leg: Secondary | ICD-10-CM | POA: Diagnosis not present

## 2020-12-25 DIAGNOSIS — Z7901 Long term (current) use of anticoagulants: Secondary | ICD-10-CM | POA: Diagnosis not present

## 2020-12-25 DIAGNOSIS — Z03818 Encounter for observation for suspected exposure to other biological agents ruled out: Secondary | ICD-10-CM | POA: Diagnosis not present

## 2020-12-25 DIAGNOSIS — E785 Hyperlipidemia, unspecified: Secondary | ICD-10-CM | POA: Diagnosis not present

## 2020-12-25 DIAGNOSIS — I70248 Atherosclerosis of native arteries of left leg with ulceration of other part of lower left leg: Secondary | ICD-10-CM | POA: Diagnosis not present

## 2020-12-25 DIAGNOSIS — I5030 Unspecified diastolic (congestive) heart failure: Secondary | ICD-10-CM | POA: Diagnosis not present

## 2020-12-25 DIAGNOSIS — Z4800 Encounter for change or removal of nonsurgical wound dressing: Secondary | ICD-10-CM | POA: Diagnosis not present

## 2020-12-25 DIAGNOSIS — R350 Frequency of micturition: Secondary | ICD-10-CM | POA: Diagnosis not present

## 2020-12-25 DIAGNOSIS — Z993 Dependence on wheelchair: Secondary | ICD-10-CM | POA: Diagnosis not present

## 2020-12-25 DIAGNOSIS — G4700 Insomnia, unspecified: Secondary | ICD-10-CM | POA: Diagnosis not present

## 2020-12-25 DIAGNOSIS — S32592D Other specified fracture of left pubis, subsequent encounter for fracture with routine healing: Secondary | ICD-10-CM | POA: Diagnosis not present

## 2020-12-25 DIAGNOSIS — I4819 Other persistent atrial fibrillation: Secondary | ICD-10-CM | POA: Diagnosis not present

## 2020-12-25 DIAGNOSIS — L97819 Non-pressure chronic ulcer of other part of right lower leg with unspecified severity: Secondary | ICD-10-CM | POA: Diagnosis not present

## 2020-12-25 DIAGNOSIS — Z87440 Personal history of urinary (tract) infections: Secondary | ICD-10-CM | POA: Diagnosis not present

## 2020-12-25 DIAGNOSIS — L97519 Non-pressure chronic ulcer of other part of right foot with unspecified severity: Secondary | ICD-10-CM | POA: Diagnosis not present

## 2020-12-25 DIAGNOSIS — L97829 Non-pressure chronic ulcer of other part of left lower leg with unspecified severity: Secondary | ICD-10-CM | POA: Diagnosis not present

## 2020-12-27 DIAGNOSIS — L97829 Non-pressure chronic ulcer of other part of left lower leg with unspecified severity: Secondary | ICD-10-CM | POA: Diagnosis not present

## 2020-12-27 DIAGNOSIS — I70248 Atherosclerosis of native arteries of left leg with ulceration of other part of lower left leg: Secondary | ICD-10-CM | POA: Diagnosis not present

## 2020-12-27 DIAGNOSIS — I5030 Unspecified diastolic (congestive) heart failure: Secondary | ICD-10-CM | POA: Diagnosis not present

## 2020-12-27 DIAGNOSIS — Z4800 Encounter for change or removal of nonsurgical wound dressing: Secondary | ICD-10-CM | POA: Diagnosis not present

## 2020-12-27 DIAGNOSIS — Z7901 Long term (current) use of anticoagulants: Secondary | ICD-10-CM | POA: Diagnosis not present

## 2020-12-27 DIAGNOSIS — L97519 Non-pressure chronic ulcer of other part of right foot with unspecified severity: Secondary | ICD-10-CM | POA: Diagnosis not present

## 2020-12-27 DIAGNOSIS — Z6822 Body mass index (BMI) 22.0-22.9, adult: Secondary | ICD-10-CM | POA: Diagnosis not present

## 2020-12-27 DIAGNOSIS — Z993 Dependence on wheelchair: Secondary | ICD-10-CM | POA: Diagnosis not present

## 2020-12-27 DIAGNOSIS — R7303 Prediabetes: Secondary | ICD-10-CM | POA: Diagnosis not present

## 2020-12-27 DIAGNOSIS — I4819 Other persistent atrial fibrillation: Secondary | ICD-10-CM | POA: Diagnosis not present

## 2020-12-27 DIAGNOSIS — G4700 Insomnia, unspecified: Secondary | ICD-10-CM | POA: Diagnosis not present

## 2020-12-27 DIAGNOSIS — I70238 Atherosclerosis of native arteries of right leg with ulceration of other part of lower right leg: Secondary | ICD-10-CM | POA: Diagnosis not present

## 2020-12-27 DIAGNOSIS — R350 Frequency of micturition: Secondary | ICD-10-CM | POA: Diagnosis not present

## 2020-12-27 DIAGNOSIS — Z87440 Personal history of urinary (tract) infections: Secondary | ICD-10-CM | POA: Diagnosis not present

## 2020-12-27 DIAGNOSIS — E785 Hyperlipidemia, unspecified: Secondary | ICD-10-CM | POA: Diagnosis not present

## 2020-12-27 DIAGNOSIS — N183 Chronic kidney disease, stage 3 unspecified: Secondary | ICD-10-CM | POA: Diagnosis not present

## 2020-12-27 DIAGNOSIS — L97819 Non-pressure chronic ulcer of other part of right lower leg with unspecified severity: Secondary | ICD-10-CM | POA: Diagnosis not present

## 2020-12-30 DIAGNOSIS — Z6822 Body mass index (BMI) 22.0-22.9, adult: Secondary | ICD-10-CM | POA: Diagnosis not present

## 2020-12-30 DIAGNOSIS — R7303 Prediabetes: Secondary | ICD-10-CM | POA: Diagnosis not present

## 2020-12-30 DIAGNOSIS — L89151 Pressure ulcer of sacral region, stage 1: Secondary | ICD-10-CM | POA: Diagnosis not present

## 2020-12-30 DIAGNOSIS — G4700 Insomnia, unspecified: Secondary | ICD-10-CM | POA: Diagnosis not present

## 2020-12-30 DIAGNOSIS — Z993 Dependence on wheelchair: Secondary | ICD-10-CM | POA: Diagnosis not present

## 2020-12-30 DIAGNOSIS — N183 Chronic kidney disease, stage 3 unspecified: Secondary | ICD-10-CM | POA: Diagnosis not present

## 2020-12-30 DIAGNOSIS — R350 Frequency of micturition: Secondary | ICD-10-CM | POA: Diagnosis not present

## 2020-12-30 DIAGNOSIS — Z4800 Encounter for change or removal of nonsurgical wound dressing: Secondary | ICD-10-CM | POA: Diagnosis not present

## 2020-12-30 DIAGNOSIS — L97211 Non-pressure chronic ulcer of right calf limited to breakdown of skin: Secondary | ICD-10-CM | POA: Diagnosis not present

## 2020-12-30 DIAGNOSIS — I70238 Atherosclerosis of native arteries of right leg with ulceration of other part of lower right leg: Secondary | ICD-10-CM | POA: Diagnosis not present

## 2020-12-30 DIAGNOSIS — L97221 Non-pressure chronic ulcer of left calf limited to breakdown of skin: Secondary | ICD-10-CM | POA: Diagnosis not present

## 2020-12-30 DIAGNOSIS — E785 Hyperlipidemia, unspecified: Secondary | ICD-10-CM | POA: Diagnosis not present

## 2020-12-30 DIAGNOSIS — I70248 Atherosclerosis of native arteries of left leg with ulceration of other part of lower left leg: Secondary | ICD-10-CM | POA: Diagnosis not present

## 2020-12-30 DIAGNOSIS — Z7901 Long term (current) use of anticoagulants: Secondary | ICD-10-CM | POA: Diagnosis not present

## 2020-12-30 DIAGNOSIS — Z87440 Personal history of urinary (tract) infections: Secondary | ICD-10-CM | POA: Diagnosis not present

## 2020-12-30 DIAGNOSIS — I4819 Other persistent atrial fibrillation: Secondary | ICD-10-CM | POA: Diagnosis not present

## 2020-12-30 DIAGNOSIS — L97819 Non-pressure chronic ulcer of other part of right lower leg with unspecified severity: Secondary | ICD-10-CM | POA: Diagnosis not present

## 2020-12-30 DIAGNOSIS — L97519 Non-pressure chronic ulcer of other part of right foot with unspecified severity: Secondary | ICD-10-CM | POA: Diagnosis not present

## 2020-12-30 DIAGNOSIS — I5030 Unspecified diastolic (congestive) heart failure: Secondary | ICD-10-CM | POA: Diagnosis not present

## 2020-12-30 DIAGNOSIS — L97829 Non-pressure chronic ulcer of other part of left lower leg with unspecified severity: Secondary | ICD-10-CM | POA: Diagnosis not present

## 2021-01-01 ENCOUNTER — Encounter: Payer: Self-pay | Admitting: Internal Medicine

## 2021-01-01 ENCOUNTER — Other Ambulatory Visit: Payer: Self-pay

## 2021-01-01 ENCOUNTER — Telehealth: Payer: Self-pay | Admitting: Internal Medicine

## 2021-01-01 ENCOUNTER — Ambulatory Visit: Payer: Medicare Other | Admitting: Internal Medicine

## 2021-01-01 VITALS — BP 100/68 | HR 97 | Ht 60.0 in | Wt 142.1 lb

## 2021-01-01 DIAGNOSIS — I5032 Chronic diastolic (congestive) heart failure: Secondary | ICD-10-CM

## 2021-01-01 DIAGNOSIS — N179 Acute kidney failure, unspecified: Secondary | ICD-10-CM

## 2021-01-01 DIAGNOSIS — I4821 Permanent atrial fibrillation: Secondary | ICD-10-CM | POA: Diagnosis not present

## 2021-01-01 DIAGNOSIS — I70238 Atherosclerosis of native arteries of right leg with ulceration of other part of lower right leg: Secondary | ICD-10-CM | POA: Diagnosis not present

## 2021-01-01 DIAGNOSIS — Z4800 Encounter for change or removal of nonsurgical wound dressing: Secondary | ICD-10-CM | POA: Diagnosis not present

## 2021-01-01 DIAGNOSIS — E785 Hyperlipidemia, unspecified: Secondary | ICD-10-CM | POA: Diagnosis not present

## 2021-01-01 DIAGNOSIS — L97519 Non-pressure chronic ulcer of other part of right foot with unspecified severity: Secondary | ICD-10-CM | POA: Diagnosis not present

## 2021-01-01 DIAGNOSIS — I5030 Unspecified diastolic (congestive) heart failure: Secondary | ICD-10-CM | POA: Diagnosis not present

## 2021-01-01 DIAGNOSIS — G4700 Insomnia, unspecified: Secondary | ICD-10-CM | POA: Diagnosis not present

## 2021-01-01 DIAGNOSIS — Z993 Dependence on wheelchair: Secondary | ICD-10-CM | POA: Diagnosis not present

## 2021-01-01 DIAGNOSIS — I70248 Atherosclerosis of native arteries of left leg with ulceration of other part of lower left leg: Secondary | ICD-10-CM | POA: Diagnosis not present

## 2021-01-01 DIAGNOSIS — I10 Essential (primary) hypertension: Secondary | ICD-10-CM | POA: Diagnosis not present

## 2021-01-01 DIAGNOSIS — R350 Frequency of micturition: Secondary | ICD-10-CM | POA: Diagnosis not present

## 2021-01-01 DIAGNOSIS — Z6822 Body mass index (BMI) 22.0-22.9, adult: Secondary | ICD-10-CM | POA: Diagnosis not present

## 2021-01-01 DIAGNOSIS — Z87440 Personal history of urinary (tract) infections: Secondary | ICD-10-CM | POA: Diagnosis not present

## 2021-01-01 DIAGNOSIS — R7303 Prediabetes: Secondary | ICD-10-CM | POA: Diagnosis not present

## 2021-01-01 DIAGNOSIS — L97819 Non-pressure chronic ulcer of other part of right lower leg with unspecified severity: Secondary | ICD-10-CM | POA: Diagnosis not present

## 2021-01-01 DIAGNOSIS — I4819 Other persistent atrial fibrillation: Secondary | ICD-10-CM | POA: Diagnosis not present

## 2021-01-01 DIAGNOSIS — N183 Chronic kidney disease, stage 3 unspecified: Secondary | ICD-10-CM | POA: Diagnosis not present

## 2021-01-01 DIAGNOSIS — L97829 Non-pressure chronic ulcer of other part of left lower leg with unspecified severity: Secondary | ICD-10-CM | POA: Diagnosis not present

## 2021-01-01 DIAGNOSIS — Z7901 Long term (current) use of anticoagulants: Secondary | ICD-10-CM | POA: Diagnosis not present

## 2021-01-01 NOTE — Patient Instructions (Signed)
Medication Instructions:  Your physician has recommended you make the following change in your medication:  1- TAKE Furosemide 40 mg by mouth two times a day for 3 days, then take 40 mg by mouth daily.  *If you need a refill on your cardiac medications before your next appointment, please call your pharmacy*  Lab Work: Your physician recommends that you return for lab work in: [place].   If you have labs (blood work) drawn today and your tests are completely normal, you will receive your results only by: Marland Kitchen MyChart Message (if you have MyChart) OR . A paper copy in the mail If you have any lab test that is abnormal or we need to change your treatment, we will call you to review the results.  Testing/Procedures: none  Follow-Up: At [facility], you and your health needs are our priority.  As part of our continuing mission to provide you with exceptional heart care, we have created designated Provider Care Teams.  These Care Teams include your primary Cardiologist (physician) and Advanced Practice Providers (APPs -  Physician Assistants and Nurse Practitioners) who all work together to provide you with the care you need, when you need it.  We recommend signing up for the patient portal called "MyChart".  Sign up information is provided on this After Visit Summary.  MyChart is used to connect with patients for Virtual Visits (Telemedicine).  Patients are able to view lab/test results, encounter notes, upcoming appointments, etc.  Non-urgent messages can be sent to your provider as well.   To learn more about what you can do with MyChart, go to [email or web address].    Your next appointment:   4-5 week(s)  The format for your next appointment:   In Person  Provider:   You may see [name], MD or one of the following Advanced Practice Providers on your designated Care Team:    Sann Hodgkins, NP  Christell Faith, PA-C  Marrianne Mood, PA-C  Cadence Elmer,  [place]  Laurann Montana, NP

## 2021-01-01 NOTE — Progress Notes (Signed)
Follow-up Outpatient Visit Date: 01/01/2021  Primary Care Provider: Philmore Pali, NP [place] [id number]  Chief Complaint: Follow-up atrial fibrillation and HFpEF  HPI:  Kristen Graham is a 87 y.o. female with history of permanent atrial fibrillation, chronic HFpEF, heart disease with at least moderate right popliteal artery stenosis, hypertension, hyperlipidemia, prediabetes, and chronic kidney disease stage III, who presents for follow-up of atrial fibrillation, HFpEF, and PAD, who presents for follow-up of atrial fibrillation and chronic HFpEF.  I last saw her in early December, at which time she seemed to be doing a little better with less leg swelling.  Her son remained concerned about intermittent lethargy and possible excess use of alprazolam.  I encouraged consultation with her PCP in the hopes of discontinuing alprazolam altogether.  Due to soft blood pressures, furosemide, potassium, and metoprolol were all decreased.  Today, Kristen Graham reports she is feeling relatively well.  She has not had any further falls over the last month and a half.  Her pelvic fractures have resolved with conservative management.  She continues to have her legs wrapped by wound care nurses.  Her calf edema is much better though her thighs seem to be more swollen.  She has not had any chest pain, shortness of breath, palpitations, or lightheadedness.  She and her son note that Ms. Basurto drinks quite a bit of water.  --------------------------------------------------------------------------------------------------  Cardiovascular History & Procedures: Cardiovascular Problems:  Atrial fibrillation  HFpEF  Mitral, tricuspid, and aortic regurgitation  Risk Factors:  Hypertension, hyperlipidemia, prediabetes, and age > 46  Cath/PCI:  None  CV Surgery:  None  EP Procedures and Devices:  None  Non-Invasive Evaluation(s):  Bilateral lower extremity arterial Dopplers  (11/03/2019): Unable to obtain accurate ABI/TBI's. Doppler examination indicates moderate (50 to 74%) right popliteal artery stenosis. Occlusion to high-grade stenosis in mid to distal right posterior tibial artery noted.  TTE (09/12/2019): Normal LV size. LVEF 55-60% with moderate focal thickening of the basal septum. Normal RV systolic function with mild dilation. Mild to moderate left atrial enlargement. Mild right atrial enlargement. Moderate mitral and severe tricuspid regurgitation. Mild aortic regurgitation. Elevated central venous pressure. Mild pulmonary hypertension.   Recent CV Pertinent Labs: Lab Results  Component Value Date   CHOL 132 11/07/2019   HDL 42 11/07/2019   LDLCALC 76 11/07/2019   TRIG 71 11/07/2019   CHOLHDL 3.1 11/07/2019   K 3.9 01/01/2021   BUN 20 01/01/2021   CREATININE 1.11 (H) 01/01/2021    Past medical and surgical history were reviewed and updated in EPIC.  Current Meds  Medication Sig  . ALPRAZolam (XANAX) 0.5 MG tablet Take 0.5 mg by mouth 2 (two) times daily as needed.  Marland Kitchen escitalopram (LEXAPRO) 5 MG tablet Take 5 mg by mouth daily.  . furosemide (LASIX) 40 MG tablet Take 1 tablet (40 mg total) by mouth daily.  Marland Kitchen ibuprofen (ADVIL) 400 MG tablet Take by mouth.  . metoprolol succinate (TOPROL-XL) 25 MG 24 hr tablet Take 1.5 tablets (37.5 mg total) by mouth in the morning and at bedtime.  Marland Kitchen oxyCODONE (OXY IR/ROXICODONE) 5 MG immediate release tablet Take 1-2 tablets (5-10 mg total) by mouth every 4 (four) hours as needed for moderate pain.  . potassium chloride SA (KLOR-CON) 20 MEQ tablet Take 1 tablet (20 mEq total) by mouth daily.  Alveda Reasons 15 MG TABS tablet TAKE 1 TABLET BY MOUTH EVERY DAY WITH SUPPER    Allergies: Other and Sulfa antibiotics  Social History   Tobacco Use  . Smoking status: Never Smoker  . Smokeless tobacco: Never Used  Vaping Use  . Vaping Use: Never used  Substance Use Topics  . Alcohol use: No  . Drug use:  Never    Family History  Problem Relation Age of Onset  . Stroke Mother   . Arrhythmia Son     Review of Systems: A 12-system review of systems was performed and was negative except as noted in the HPI.  --------------------------------------------------------------------------------------------------  Physical Exam: BP 100/68 (BP Location: Left Arm, Patient Position: Sitting, Cuff Size: Normal)   Pulse 97   Ht 5' (1.524 m)   Wt 142 lb 2 oz (64.5 kg)   SpO2 97%   BMI 27.76 kg/m   General:  NAD.  Seated in a wheelchair and accompanied by her son. Neck: JVP approximately 6 to 8 cm. Lungs: Clear to auscultation bilaterally without wheezes or crackles. Heart: Irregularly irregular without murmurs, rubs, or gallops. Abdomen: Soft, nontender, nondistended. Extremities: Both calves wrapped.  2-3+ pitting edema noted above wraps in the calves.  EKG: No fibrillation with left axis deviation, low voltage, and poor R wave progression.  Lab Results  Component Value Date   WBC 11.9 (H) 11/01/2020   HGB 14.6 11/01/2020   HCT 44.7 11/01/2020   MCV 96.8 11/01/2020   PLT 204 11/01/2020    Lab Results  Component Value Date   NA 139 01/01/2021   K 3.9 01/01/2021   CL 100 01/01/2021   CO2 25 01/01/2021   BUN 20 01/01/2021   CREATININE 1.11 (H) 01/01/2021   GLUCOSE 86 01/01/2021   ALT 11 11/01/2020    Lab Results  Component Value Date   CHOL 132 11/07/2019   HDL 42 11/07/2019   LDLCALC 76 11/07/2019   TRIG 71 11/07/2019   CHOLHDL 3.1 11/07/2019    --------------------------------------------------------------------------------------------------  ASSESSMENT AND PLAN: Chronic HFpEF and acute kidney injury: Overall, Ms. Alan appears relatively stable though she still has profound leg edema above the wraps around her calves.  In the past, we have experienced hypotension and worsening renal function with protracted escalation of her diuretic therapy.  I think she would  benefit from short-term escalation of furosemide to 40 mg twice daily.  I have advised her to do this for 3 days and then drop back to 40 mg daily.  Importance of fluid and sodium restriction was also discussed at length today.  I will check a BMP today, given escalation of diuretic therapy and mild AKI on last check in 10/2020.  Permanent atrial fibrillation: Ventricular rates adequately controlled.  No further falls or bleeding reported.  We discussed the risks and benefits of continued anticoagulation with Ms. Carlton history of falls and advanced age, but have agreed to continue with low-dose rivaroxaban unless she has further falls.  Follow-up: Return to clinic in 4-5 weeks.  [name], MD 01/03/2021 7:21 AM

## 2021-01-01 NOTE — Telephone Encounter (Signed)
Patient's son states that patient is scheduled to have labs drawn at PCP on Monday 2/21. He asks if we can use the same labs that were drawn today for those lab orders at her PCP. Please call to discuss.

## 2021-01-01 NOTE — Telephone Encounter (Signed)
Spoke to [employer], patient's son, ok per DPR. Patient is supposed to go to PCP Monday for lab work and he is asking if patient still needs labs at PCP. Advised he would need to call PCP and let them know patient had a BMET today and see what they advise. Let him know to call us back to fax results if they would like that. He was appreciative.

## 2021-01-02 LAB — BASIC METABOLIC PANEL
BUN/Creatinine Ratio: 18 (ref 12–28)
BUN: 20 mg/dL (ref 10–36)
CO2: 25 mmol/L (ref 20–29)
Calcium: 9.2 mg/dL (ref 8.7–10.3)
Chloride: 100 mmol/L (ref 96–106)
Creatinine, Ser: 1.11 mg/dL — ABNORMAL HIGH (ref 0.57–1.00)
GFR calc Af Amer: 51 mL/min/{1.73_m2} — ABNORMAL LOW (ref >59)
GFR calc non Af Amer: 44 mL/min/{1.73_m2} — ABNORMAL LOW (ref >59)
Glucose: 86 mg/dL (ref 65–99)
Potassium: 3.9 mmol/L (ref 3.5–5.2)
Sodium: 139 mmol/L (ref 134–144)

## 2021-01-02 NOTE — Telephone Encounter (Signed)
Patient son calling to confirm he is going to take patient for labs as they have ordered different labs .  He is aware to ask them to forward.

## 2021-01-03 ENCOUNTER — Encounter: Payer: Self-pay | Admitting: Internal Medicine

## 2021-01-03 DIAGNOSIS — G4700 Insomnia, unspecified: Secondary | ICD-10-CM | POA: Diagnosis not present

## 2021-01-03 DIAGNOSIS — Z87440 Personal history of urinary (tract) infections: Secondary | ICD-10-CM | POA: Diagnosis not present

## 2021-01-03 DIAGNOSIS — L97829 Non-pressure chronic ulcer of other part of left lower leg with unspecified severity: Secondary | ICD-10-CM | POA: Diagnosis not present

## 2021-01-03 DIAGNOSIS — R350 Frequency of micturition: Secondary | ICD-10-CM | POA: Diagnosis not present

## 2021-01-03 DIAGNOSIS — L97519 Non-pressure chronic ulcer of other part of right foot with unspecified severity: Secondary | ICD-10-CM | POA: Diagnosis not present

## 2021-01-03 DIAGNOSIS — Z4800 Encounter for change or removal of nonsurgical wound dressing: Secondary | ICD-10-CM | POA: Diagnosis not present

## 2021-01-03 DIAGNOSIS — I70248 Atherosclerosis of native arteries of left leg with ulceration of other part of lower left leg: Secondary | ICD-10-CM | POA: Diagnosis not present

## 2021-01-03 DIAGNOSIS — Z7901 Long term (current) use of anticoagulants: Secondary | ICD-10-CM | POA: Diagnosis not present

## 2021-01-03 DIAGNOSIS — N183 Chronic kidney disease, stage 3 unspecified: Secondary | ICD-10-CM | POA: Diagnosis not present

## 2021-01-03 DIAGNOSIS — Z993 Dependence on wheelchair: Secondary | ICD-10-CM | POA: Diagnosis not present

## 2021-01-03 DIAGNOSIS — I4819 Other persistent atrial fibrillation: Secondary | ICD-10-CM | POA: Diagnosis not present

## 2021-01-03 DIAGNOSIS — I5030 Unspecified diastolic (congestive) heart failure: Secondary | ICD-10-CM | POA: Diagnosis not present

## 2021-01-03 DIAGNOSIS — I70238 Atherosclerosis of native arteries of right leg with ulceration of other part of lower right leg: Secondary | ICD-10-CM | POA: Diagnosis not present

## 2021-01-03 DIAGNOSIS — R7303 Prediabetes: Secondary | ICD-10-CM | POA: Diagnosis not present

## 2021-01-03 DIAGNOSIS — E785 Hyperlipidemia, unspecified: Secondary | ICD-10-CM | POA: Diagnosis not present

## 2021-01-03 DIAGNOSIS — Z6822 Body mass index (BMI) 22.0-22.9, adult: Secondary | ICD-10-CM | POA: Diagnosis not present

## 2021-01-03 DIAGNOSIS — L97819 Non-pressure chronic ulcer of other part of right lower leg with unspecified severity: Secondary | ICD-10-CM | POA: Diagnosis not present

## 2021-01-04 ENCOUNTER — Other Ambulatory Visit: Payer: Self-pay | Admitting: Internal Medicine

## 2021-01-06 ENCOUNTER — Encounter: Payer: Self-pay | Admitting: Internal Medicine

## 2021-01-06 DIAGNOSIS — I70238 Atherosclerosis of native arteries of right leg with ulceration of other part of lower right leg: Secondary | ICD-10-CM | POA: Diagnosis not present

## 2021-01-06 DIAGNOSIS — Z139 Encounter for screening, unspecified: Secondary | ICD-10-CM | POA: Diagnosis not present

## 2021-01-06 DIAGNOSIS — I739 Peripheral vascular disease, unspecified: Secondary | ICD-10-CM | POA: Diagnosis not present

## 2021-01-06 DIAGNOSIS — E785 Hyperlipidemia, unspecified: Secondary | ICD-10-CM | POA: Diagnosis not present

## 2021-01-06 DIAGNOSIS — R7303 Prediabetes: Secondary | ICD-10-CM | POA: Diagnosis not present

## 2021-01-06 DIAGNOSIS — G4700 Insomnia, unspecified: Secondary | ICD-10-CM | POA: Diagnosis not present

## 2021-01-06 DIAGNOSIS — I70248 Atherosclerosis of native arteries of left leg with ulceration of other part of lower left leg: Secondary | ICD-10-CM | POA: Diagnosis not present

## 2021-01-06 DIAGNOSIS — Z4800 Encounter for change or removal of nonsurgical wound dressing: Secondary | ICD-10-CM | POA: Diagnosis not present

## 2021-01-06 DIAGNOSIS — I4819 Other persistent atrial fibrillation: Secondary | ICD-10-CM | POA: Diagnosis not present

## 2021-01-06 DIAGNOSIS — L97519 Non-pressure chronic ulcer of other part of right foot with unspecified severity: Secondary | ICD-10-CM | POA: Diagnosis not present

## 2021-01-06 DIAGNOSIS — Z6822 Body mass index (BMI) 22.0-22.9, adult: Secondary | ICD-10-CM | POA: Diagnosis not present

## 2021-01-06 DIAGNOSIS — L97819 Non-pressure chronic ulcer of other part of right lower leg with unspecified severity: Secondary | ICD-10-CM | POA: Diagnosis not present

## 2021-01-06 DIAGNOSIS — I5030 Unspecified diastolic (congestive) heart failure: Secondary | ICD-10-CM | POA: Diagnosis not present

## 2021-01-06 DIAGNOSIS — L97829 Non-pressure chronic ulcer of other part of left lower leg with unspecified severity: Secondary | ICD-10-CM | POA: Diagnosis not present

## 2021-01-06 DIAGNOSIS — Z993 Dependence on wheelchair: Secondary | ICD-10-CM | POA: Diagnosis not present

## 2021-01-06 DIAGNOSIS — I4891 Unspecified atrial fibrillation: Secondary | ICD-10-CM | POA: Diagnosis not present

## 2021-01-06 DIAGNOSIS — N1831 Chronic kidney disease, stage 3a: Secondary | ICD-10-CM | POA: Diagnosis not present

## 2021-01-06 DIAGNOSIS — N183 Chronic kidney disease, stage 3 unspecified: Secondary | ICD-10-CM | POA: Diagnosis not present

## 2021-01-06 DIAGNOSIS — Z7901 Long term (current) use of anticoagulants: Secondary | ICD-10-CM | POA: Diagnosis not present

## 2021-01-06 DIAGNOSIS — I878 Other specified disorders of veins: Secondary | ICD-10-CM | POA: Diagnosis not present

## 2021-01-06 DIAGNOSIS — Z87440 Personal history of urinary (tract) infections: Secondary | ICD-10-CM | POA: Diagnosis not present

## 2021-01-06 DIAGNOSIS — R350 Frequency of micturition: Secondary | ICD-10-CM | POA: Diagnosis not present

## 2021-01-08 DIAGNOSIS — L97829 Non-pressure chronic ulcer of other part of left lower leg with unspecified severity: Secondary | ICD-10-CM | POA: Diagnosis not present

## 2021-01-08 DIAGNOSIS — I70238 Atherosclerosis of native arteries of right leg with ulceration of other part of lower right leg: Secondary | ICD-10-CM | POA: Diagnosis not present

## 2021-01-08 DIAGNOSIS — Z993 Dependence on wheelchair: Secondary | ICD-10-CM | POA: Diagnosis not present

## 2021-01-08 DIAGNOSIS — Z6822 Body mass index (BMI) 22.0-22.9, adult: Secondary | ICD-10-CM | POA: Diagnosis not present

## 2021-01-08 DIAGNOSIS — Z4800 Encounter for change or removal of nonsurgical wound dressing: Secondary | ICD-10-CM | POA: Diagnosis not present

## 2021-01-08 DIAGNOSIS — L97519 Non-pressure chronic ulcer of other part of right foot with unspecified severity: Secondary | ICD-10-CM | POA: Diagnosis not present

## 2021-01-08 DIAGNOSIS — G4700 Insomnia, unspecified: Secondary | ICD-10-CM | POA: Diagnosis not present

## 2021-01-08 DIAGNOSIS — L97819 Non-pressure chronic ulcer of other part of right lower leg with unspecified severity: Secondary | ICD-10-CM | POA: Diagnosis not present

## 2021-01-08 DIAGNOSIS — Z7901 Long term (current) use of anticoagulants: Secondary | ICD-10-CM | POA: Diagnosis not present

## 2021-01-08 DIAGNOSIS — R7303 Prediabetes: Secondary | ICD-10-CM | POA: Diagnosis not present

## 2021-01-08 DIAGNOSIS — E785 Hyperlipidemia, unspecified: Secondary | ICD-10-CM | POA: Diagnosis not present

## 2021-01-08 DIAGNOSIS — I5030 Unspecified diastolic (congestive) heart failure: Secondary | ICD-10-CM | POA: Diagnosis not present

## 2021-01-08 DIAGNOSIS — I4819 Other persistent atrial fibrillation: Secondary | ICD-10-CM | POA: Diagnosis not present

## 2021-01-08 DIAGNOSIS — I70248 Atherosclerosis of native arteries of left leg with ulceration of other part of lower left leg: Secondary | ICD-10-CM | POA: Diagnosis not present

## 2021-01-08 DIAGNOSIS — R350 Frequency of micturition: Secondary | ICD-10-CM | POA: Diagnosis not present

## 2021-01-08 DIAGNOSIS — N183 Chronic kidney disease, stage 3 unspecified: Secondary | ICD-10-CM | POA: Diagnosis not present

## 2021-01-08 DIAGNOSIS — Z87440 Personal history of urinary (tract) infections: Secondary | ICD-10-CM | POA: Diagnosis not present

## 2021-01-10 DIAGNOSIS — L97829 Non-pressure chronic ulcer of other part of left lower leg with unspecified severity: Secondary | ICD-10-CM | POA: Diagnosis not present

## 2021-01-10 DIAGNOSIS — I70238 Atherosclerosis of native arteries of right leg with ulceration of other part of lower right leg: Secondary | ICD-10-CM | POA: Diagnosis not present

## 2021-01-10 DIAGNOSIS — R350 Frequency of micturition: Secondary | ICD-10-CM | POA: Diagnosis not present

## 2021-01-10 DIAGNOSIS — Z993 Dependence on wheelchair: Secondary | ICD-10-CM | POA: Diagnosis not present

## 2021-01-10 DIAGNOSIS — I5030 Unspecified diastolic (congestive) heart failure: Secondary | ICD-10-CM | POA: Diagnosis not present

## 2021-01-10 DIAGNOSIS — Z4800 Encounter for change or removal of nonsurgical wound dressing: Secondary | ICD-10-CM | POA: Diagnosis not present

## 2021-01-10 DIAGNOSIS — I70248 Atherosclerosis of native arteries of left leg with ulceration of other part of lower left leg: Secondary | ICD-10-CM | POA: Diagnosis not present

## 2021-01-10 DIAGNOSIS — E785 Hyperlipidemia, unspecified: Secondary | ICD-10-CM | POA: Diagnosis not present

## 2021-01-10 DIAGNOSIS — Z6822 Body mass index (BMI) 22.0-22.9, adult: Secondary | ICD-10-CM | POA: Diagnosis not present

## 2021-01-10 DIAGNOSIS — L97519 Non-pressure chronic ulcer of other part of right foot with unspecified severity: Secondary | ICD-10-CM | POA: Diagnosis not present

## 2021-01-10 DIAGNOSIS — L97819 Non-pressure chronic ulcer of other part of right lower leg with unspecified severity: Secondary | ICD-10-CM | POA: Diagnosis not present

## 2021-01-10 DIAGNOSIS — N183 Chronic kidney disease, stage 3 unspecified: Secondary | ICD-10-CM | POA: Diagnosis not present

## 2021-01-10 DIAGNOSIS — R7303 Prediabetes: Secondary | ICD-10-CM | POA: Diagnosis not present

## 2021-01-10 DIAGNOSIS — I4819 Other persistent atrial fibrillation: Secondary | ICD-10-CM | POA: Diagnosis not present

## 2021-01-10 DIAGNOSIS — Z7901 Long term (current) use of anticoagulants: Secondary | ICD-10-CM | POA: Diagnosis not present

## 2021-01-10 DIAGNOSIS — Z87440 Personal history of urinary (tract) infections: Secondary | ICD-10-CM | POA: Diagnosis not present

## 2021-01-10 DIAGNOSIS — G4700 Insomnia, unspecified: Secondary | ICD-10-CM | POA: Diagnosis not present

## 2021-01-13 DIAGNOSIS — R7303 Prediabetes: Secondary | ICD-10-CM | POA: Diagnosis not present

## 2021-01-13 DIAGNOSIS — L97829 Non-pressure chronic ulcer of other part of left lower leg with unspecified severity: Secondary | ICD-10-CM | POA: Diagnosis not present

## 2021-01-13 DIAGNOSIS — I4819 Other persistent atrial fibrillation: Secondary | ICD-10-CM | POA: Diagnosis not present

## 2021-01-13 DIAGNOSIS — I70248 Atherosclerosis of native arteries of left leg with ulceration of other part of lower left leg: Secondary | ICD-10-CM | POA: Diagnosis not present

## 2021-01-13 DIAGNOSIS — Z87440 Personal history of urinary (tract) infections: Secondary | ICD-10-CM | POA: Diagnosis not present

## 2021-01-13 DIAGNOSIS — I70238 Atherosclerosis of native arteries of right leg with ulceration of other part of lower right leg: Secondary | ICD-10-CM | POA: Diagnosis not present

## 2021-01-13 DIAGNOSIS — E785 Hyperlipidemia, unspecified: Secondary | ICD-10-CM | POA: Diagnosis not present

## 2021-01-13 DIAGNOSIS — I5030 Unspecified diastolic (congestive) heart failure: Secondary | ICD-10-CM | POA: Diagnosis not present

## 2021-01-13 DIAGNOSIS — L89151 Pressure ulcer of sacral region, stage 1: Secondary | ICD-10-CM | POA: Diagnosis not present

## 2021-01-13 DIAGNOSIS — Z993 Dependence on wheelchair: Secondary | ICD-10-CM | POA: Diagnosis not present

## 2021-01-13 DIAGNOSIS — R350 Frequency of micturition: Secondary | ICD-10-CM | POA: Diagnosis not present

## 2021-01-13 DIAGNOSIS — G4700 Insomnia, unspecified: Secondary | ICD-10-CM | POA: Diagnosis not present

## 2021-01-13 DIAGNOSIS — L97519 Non-pressure chronic ulcer of other part of right foot with unspecified severity: Secondary | ICD-10-CM | POA: Diagnosis not present

## 2021-01-13 DIAGNOSIS — Z6822 Body mass index (BMI) 22.0-22.9, adult: Secondary | ICD-10-CM | POA: Diagnosis not present

## 2021-01-13 DIAGNOSIS — Z7901 Long term (current) use of anticoagulants: Secondary | ICD-10-CM | POA: Diagnosis not present

## 2021-01-13 DIAGNOSIS — Z4800 Encounter for change or removal of nonsurgical wound dressing: Secondary | ICD-10-CM | POA: Diagnosis not present

## 2021-01-13 DIAGNOSIS — L97211 Non-pressure chronic ulcer of right calf limited to breakdown of skin: Secondary | ICD-10-CM | POA: Diagnosis not present

## 2021-01-13 DIAGNOSIS — L97221 Non-pressure chronic ulcer of left calf limited to breakdown of skin: Secondary | ICD-10-CM | POA: Diagnosis not present

## 2021-01-13 DIAGNOSIS — L97819 Non-pressure chronic ulcer of other part of right lower leg with unspecified severity: Secondary | ICD-10-CM | POA: Diagnosis not present

## 2021-01-13 DIAGNOSIS — N183 Chronic kidney disease, stage 3 unspecified: Secondary | ICD-10-CM | POA: Diagnosis not present

## 2021-01-15 DIAGNOSIS — R7303 Prediabetes: Secondary | ICD-10-CM | POA: Diagnosis not present

## 2021-01-15 DIAGNOSIS — L97519 Non-pressure chronic ulcer of other part of right foot with unspecified severity: Secondary | ICD-10-CM | POA: Diagnosis not present

## 2021-01-15 DIAGNOSIS — L97829 Non-pressure chronic ulcer of other part of left lower leg with unspecified severity: Secondary | ICD-10-CM | POA: Diagnosis not present

## 2021-01-15 DIAGNOSIS — Z4800 Encounter for change or removal of nonsurgical wound dressing: Secondary | ICD-10-CM | POA: Diagnosis not present

## 2021-01-15 DIAGNOSIS — E785 Hyperlipidemia, unspecified: Secondary | ICD-10-CM | POA: Diagnosis not present

## 2021-01-15 DIAGNOSIS — I5030 Unspecified diastolic (congestive) heart failure: Secondary | ICD-10-CM | POA: Diagnosis not present

## 2021-01-15 DIAGNOSIS — G4700 Insomnia, unspecified: Secondary | ICD-10-CM | POA: Diagnosis not present

## 2021-01-15 DIAGNOSIS — Z87440 Personal history of urinary (tract) infections: Secondary | ICD-10-CM | POA: Diagnosis not present

## 2021-01-15 DIAGNOSIS — L97819 Non-pressure chronic ulcer of other part of right lower leg with unspecified severity: Secondary | ICD-10-CM | POA: Diagnosis not present

## 2021-01-15 DIAGNOSIS — I70238 Atherosclerosis of native arteries of right leg with ulceration of other part of lower right leg: Secondary | ICD-10-CM | POA: Diagnosis not present

## 2021-01-15 DIAGNOSIS — R350 Frequency of micturition: Secondary | ICD-10-CM | POA: Diagnosis not present

## 2021-01-15 DIAGNOSIS — Z6822 Body mass index (BMI) 22.0-22.9, adult: Secondary | ICD-10-CM | POA: Diagnosis not present

## 2021-01-15 DIAGNOSIS — Z993 Dependence on wheelchair: Secondary | ICD-10-CM | POA: Diagnosis not present

## 2021-01-15 DIAGNOSIS — Z7901 Long term (current) use of anticoagulants: Secondary | ICD-10-CM | POA: Diagnosis not present

## 2021-01-15 DIAGNOSIS — N183 Chronic kidney disease, stage 3 unspecified: Secondary | ICD-10-CM | POA: Diagnosis not present

## 2021-01-15 DIAGNOSIS — I70248 Atherosclerosis of native arteries of left leg with ulceration of other part of lower left leg: Secondary | ICD-10-CM | POA: Diagnosis not present

## 2021-01-15 DIAGNOSIS — I4819 Other persistent atrial fibrillation: Secondary | ICD-10-CM | POA: Diagnosis not present

## 2021-01-17 DIAGNOSIS — R350 Frequency of micturition: Secondary | ICD-10-CM | POA: Diagnosis not present

## 2021-01-17 DIAGNOSIS — Z6822 Body mass index (BMI) 22.0-22.9, adult: Secondary | ICD-10-CM | POA: Diagnosis not present

## 2021-01-17 DIAGNOSIS — L97819 Non-pressure chronic ulcer of other part of right lower leg with unspecified severity: Secondary | ICD-10-CM | POA: Diagnosis not present

## 2021-01-17 DIAGNOSIS — E785 Hyperlipidemia, unspecified: Secondary | ICD-10-CM | POA: Diagnosis not present

## 2021-01-17 DIAGNOSIS — Z7901 Long term (current) use of anticoagulants: Secondary | ICD-10-CM | POA: Diagnosis not present

## 2021-01-17 DIAGNOSIS — N183 Chronic kidney disease, stage 3 unspecified: Secondary | ICD-10-CM | POA: Diagnosis not present

## 2021-01-17 DIAGNOSIS — Z4800 Encounter for change or removal of nonsurgical wound dressing: Secondary | ICD-10-CM | POA: Diagnosis not present

## 2021-01-17 DIAGNOSIS — L97829 Non-pressure chronic ulcer of other part of left lower leg with unspecified severity: Secondary | ICD-10-CM | POA: Diagnosis not present

## 2021-01-17 DIAGNOSIS — I70248 Atherosclerosis of native arteries of left leg with ulceration of other part of lower left leg: Secondary | ICD-10-CM | POA: Diagnosis not present

## 2021-01-17 DIAGNOSIS — I70238 Atherosclerosis of native arteries of right leg with ulceration of other part of lower right leg: Secondary | ICD-10-CM | POA: Diagnosis not present

## 2021-01-17 DIAGNOSIS — Z993 Dependence on wheelchair: Secondary | ICD-10-CM | POA: Diagnosis not present

## 2021-01-17 DIAGNOSIS — G4700 Insomnia, unspecified: Secondary | ICD-10-CM | POA: Diagnosis not present

## 2021-01-17 DIAGNOSIS — I4819 Other persistent atrial fibrillation: Secondary | ICD-10-CM | POA: Diagnosis not present

## 2021-01-17 DIAGNOSIS — I5030 Unspecified diastolic (congestive) heart failure: Secondary | ICD-10-CM | POA: Diagnosis not present

## 2021-01-17 DIAGNOSIS — L97519 Non-pressure chronic ulcer of other part of right foot with unspecified severity: Secondary | ICD-10-CM | POA: Diagnosis not present

## 2021-01-17 DIAGNOSIS — R7303 Prediabetes: Secondary | ICD-10-CM | POA: Diagnosis not present

## 2021-01-17 DIAGNOSIS — Z87440 Personal history of urinary (tract) infections: Secondary | ICD-10-CM | POA: Diagnosis not present

## 2021-01-20 DIAGNOSIS — Z4800 Encounter for change or removal of nonsurgical wound dressing: Secondary | ICD-10-CM | POA: Diagnosis not present

## 2021-01-20 DIAGNOSIS — Z993 Dependence on wheelchair: Secondary | ICD-10-CM | POA: Diagnosis not present

## 2021-01-20 DIAGNOSIS — R350 Frequency of micturition: Secondary | ICD-10-CM | POA: Diagnosis not present

## 2021-01-20 DIAGNOSIS — L97819 Non-pressure chronic ulcer of other part of right lower leg with unspecified severity: Secondary | ICD-10-CM | POA: Diagnosis not present

## 2021-01-20 DIAGNOSIS — R7303 Prediabetes: Secondary | ICD-10-CM | POA: Diagnosis not present

## 2021-01-20 DIAGNOSIS — L97519 Non-pressure chronic ulcer of other part of right foot with unspecified severity: Secondary | ICD-10-CM | POA: Diagnosis not present

## 2021-01-20 DIAGNOSIS — N183 Chronic kidney disease, stage 3 unspecified: Secondary | ICD-10-CM | POA: Diagnosis not present

## 2021-01-20 DIAGNOSIS — Z87440 Personal history of urinary (tract) infections: Secondary | ICD-10-CM | POA: Diagnosis not present

## 2021-01-20 DIAGNOSIS — L97829 Non-pressure chronic ulcer of other part of left lower leg with unspecified severity: Secondary | ICD-10-CM | POA: Diagnosis not present

## 2021-01-20 DIAGNOSIS — I5030 Unspecified diastolic (congestive) heart failure: Secondary | ICD-10-CM | POA: Diagnosis not present

## 2021-01-20 DIAGNOSIS — Z6822 Body mass index (BMI) 22.0-22.9, adult: Secondary | ICD-10-CM | POA: Diagnosis not present

## 2021-01-20 DIAGNOSIS — E785 Hyperlipidemia, unspecified: Secondary | ICD-10-CM | POA: Diagnosis not present

## 2021-01-20 DIAGNOSIS — I70238 Atherosclerosis of native arteries of right leg with ulceration of other part of lower right leg: Secondary | ICD-10-CM | POA: Diagnosis not present

## 2021-01-20 DIAGNOSIS — I4819 Other persistent atrial fibrillation: Secondary | ICD-10-CM | POA: Diagnosis not present

## 2021-01-20 DIAGNOSIS — I70248 Atherosclerosis of native arteries of left leg with ulceration of other part of lower left leg: Secondary | ICD-10-CM | POA: Diagnosis not present

## 2021-01-20 DIAGNOSIS — Z7901 Long term (current) use of anticoagulants: Secondary | ICD-10-CM | POA: Diagnosis not present

## 2021-01-20 DIAGNOSIS — G4700 Insomnia, unspecified: Secondary | ICD-10-CM | POA: Diagnosis not present

## 2021-01-21 DIAGNOSIS — L97819 Non-pressure chronic ulcer of other part of right lower leg with unspecified severity: Secondary | ICD-10-CM | POA: Diagnosis not present

## 2021-01-21 DIAGNOSIS — E785 Hyperlipidemia, unspecified: Secondary | ICD-10-CM | POA: Diagnosis not present

## 2021-01-21 DIAGNOSIS — R7303 Prediabetes: Secondary | ICD-10-CM | POA: Diagnosis not present

## 2021-01-21 DIAGNOSIS — L97829 Non-pressure chronic ulcer of other part of left lower leg with unspecified severity: Secondary | ICD-10-CM | POA: Diagnosis not present

## 2021-01-21 DIAGNOSIS — I5030 Unspecified diastolic (congestive) heart failure: Secondary | ICD-10-CM | POA: Diagnosis not present

## 2021-01-21 DIAGNOSIS — I70238 Atherosclerosis of native arteries of right leg with ulceration of other part of lower right leg: Secondary | ICD-10-CM | POA: Diagnosis not present

## 2021-01-21 DIAGNOSIS — Z4800 Encounter for change or removal of nonsurgical wound dressing: Secondary | ICD-10-CM | POA: Diagnosis not present

## 2021-01-21 DIAGNOSIS — I70248 Atherosclerosis of native arteries of left leg with ulceration of other part of lower left leg: Secondary | ICD-10-CM | POA: Diagnosis not present

## 2021-01-21 DIAGNOSIS — Z6822 Body mass index (BMI) 22.0-22.9, adult: Secondary | ICD-10-CM | POA: Diagnosis not present

## 2021-01-21 DIAGNOSIS — L97519 Non-pressure chronic ulcer of other part of right foot with unspecified severity: Secondary | ICD-10-CM | POA: Diagnosis not present

## 2021-01-21 DIAGNOSIS — G4700 Insomnia, unspecified: Secondary | ICD-10-CM | POA: Diagnosis not present

## 2021-01-21 DIAGNOSIS — R350 Frequency of micturition: Secondary | ICD-10-CM | POA: Diagnosis not present

## 2021-01-21 DIAGNOSIS — N183 Chronic kidney disease, stage 3 unspecified: Secondary | ICD-10-CM | POA: Diagnosis not present

## 2021-01-21 DIAGNOSIS — I4819 Other persistent atrial fibrillation: Secondary | ICD-10-CM | POA: Diagnosis not present

## 2021-01-21 DIAGNOSIS — Z993 Dependence on wheelchair: Secondary | ICD-10-CM | POA: Diagnosis not present

## 2021-01-21 DIAGNOSIS — Z7901 Long term (current) use of anticoagulants: Secondary | ICD-10-CM | POA: Diagnosis not present

## 2021-01-21 DIAGNOSIS — Z87440 Personal history of urinary (tract) infections: Secondary | ICD-10-CM | POA: Diagnosis not present

## 2021-01-22 ENCOUNTER — Telehealth: Payer: Self-pay | Admitting: Internal Medicine

## 2021-01-22 DIAGNOSIS — L97519 Non-pressure chronic ulcer of other part of right foot with unspecified severity: Secondary | ICD-10-CM | POA: Diagnosis not present

## 2021-01-22 DIAGNOSIS — E785 Hyperlipidemia, unspecified: Secondary | ICD-10-CM | POA: Diagnosis not present

## 2021-01-22 DIAGNOSIS — N183 Chronic kidney disease, stage 3 unspecified: Secondary | ICD-10-CM | POA: Diagnosis not present

## 2021-01-22 DIAGNOSIS — G4700 Insomnia, unspecified: Secondary | ICD-10-CM | POA: Diagnosis not present

## 2021-01-22 DIAGNOSIS — R350 Frequency of micturition: Secondary | ICD-10-CM | POA: Diagnosis not present

## 2021-01-22 DIAGNOSIS — Z87440 Personal history of urinary (tract) infections: Secondary | ICD-10-CM | POA: Diagnosis not present

## 2021-01-22 DIAGNOSIS — L97829 Non-pressure chronic ulcer of other part of left lower leg with unspecified severity: Secondary | ICD-10-CM | POA: Diagnosis not present

## 2021-01-22 DIAGNOSIS — R7303 Prediabetes: Secondary | ICD-10-CM | POA: Diagnosis not present

## 2021-01-22 DIAGNOSIS — I4819 Other persistent atrial fibrillation: Secondary | ICD-10-CM | POA: Diagnosis not present

## 2021-01-22 DIAGNOSIS — I70238 Atherosclerosis of native arteries of right leg with ulceration of other part of lower right leg: Secondary | ICD-10-CM | POA: Diagnosis not present

## 2021-01-22 DIAGNOSIS — Z6822 Body mass index (BMI) 22.0-22.9, adult: Secondary | ICD-10-CM | POA: Diagnosis not present

## 2021-01-22 DIAGNOSIS — R600 Localized edema: Secondary | ICD-10-CM

## 2021-01-22 DIAGNOSIS — Z4800 Encounter for change or removal of nonsurgical wound dressing: Secondary | ICD-10-CM | POA: Diagnosis not present

## 2021-01-22 DIAGNOSIS — L97819 Non-pressure chronic ulcer of other part of right lower leg with unspecified severity: Secondary | ICD-10-CM | POA: Diagnosis not present

## 2021-01-22 DIAGNOSIS — Z7901 Long term (current) use of anticoagulants: Secondary | ICD-10-CM | POA: Diagnosis not present

## 2021-01-22 DIAGNOSIS — Z993 Dependence on wheelchair: Secondary | ICD-10-CM | POA: Diagnosis not present

## 2021-01-22 DIAGNOSIS — I5030 Unspecified diastolic (congestive) heart failure: Secondary | ICD-10-CM | POA: Diagnosis not present

## 2021-01-22 DIAGNOSIS — I70248 Atherosclerosis of native arteries of left leg with ulceration of other part of lower left leg: Secondary | ICD-10-CM | POA: Diagnosis not present

## 2021-01-22 NOTE — Telephone Encounter (Signed)
Spoke with patient's son. Home health nurse wraps patient's legs from the knees down to help with wound healing and swelling. However, from the knees up to bottom, her legs are swollen. They are wondering if patient can up her furosemide. Currently, she is taking furosemide 40 mg daily. Son does not know recent VS readings but states the nurse said her vital signs are fine. Patient denies shortness of breath.  Advised I will route to Dr End and let them know any further advice.  Next appointment is 02/05/21 with Gilford Rile, NP.

## 2021-01-22 NOTE — Telephone Encounter (Signed)
Pt c/o swelling: STAT is pt has developed SOB within 24 hours  1) How much weight have you gained and in what time span? Possible a few pounds but unsure  2) If swelling, where is the swelling located? Legs and thighs  3) Are you currently taking a fluid pill? Yes, lasix   4) Are you currently SOB? no  5) Do you have a log of your daily weights (if so, list)? no  6) Have you gained 3 pounds in a day or 5 pounds in a week?   7) Have you traveled recently?   Patient son calling.  States home health nurse has helped to wrap legs but they would like to know if she can up her lasix.  Please call Doren Custard to discuss.

## 2021-01-23 NOTE — Telephone Encounter (Signed)
I think it is okay to increase furosemide to 40 mg BID for a couple of days, though in the past prolonged escalation of diuretic therapy led to AKI.  I wonder if venous insufficiency and/or lymphedema are contributing to her leg swelling.  I think it would be worthwhile to refer Ms. Tagle to vascular surgery for further evaluation of these possible issues.  [name], MD [facility]

## 2021-01-23 NOTE — Telephone Encounter (Signed)
Spoke to patient's son, ok per DPR.  He verbalized understanding of Dr Darnelle Bos recommendations. Advised I will place the referral to [facility] and they should call him to scheduled. Advised him to call our office next week or call [facility] directly to schedule appointment if he does not receive a call in the next week.

## 2021-01-24 DIAGNOSIS — I70238 Atherosclerosis of native arteries of right leg with ulceration of other part of lower right leg: Secondary | ICD-10-CM | POA: Diagnosis not present

## 2021-01-24 DIAGNOSIS — G4700 Insomnia, unspecified: Secondary | ICD-10-CM | POA: Diagnosis not present

## 2021-01-24 DIAGNOSIS — Z4800 Encounter for change or removal of nonsurgical wound dressing: Secondary | ICD-10-CM | POA: Diagnosis not present

## 2021-01-24 DIAGNOSIS — Z6822 Body mass index (BMI) 22.0-22.9, adult: Secondary | ICD-10-CM | POA: Diagnosis not present

## 2021-01-24 DIAGNOSIS — L97519 Non-pressure chronic ulcer of other part of right foot with unspecified severity: Secondary | ICD-10-CM | POA: Diagnosis not present

## 2021-01-24 DIAGNOSIS — I5030 Unspecified diastolic (congestive) heart failure: Secondary | ICD-10-CM | POA: Diagnosis not present

## 2021-01-24 DIAGNOSIS — Z87440 Personal history of urinary (tract) infections: Secondary | ICD-10-CM | POA: Diagnosis not present

## 2021-01-24 DIAGNOSIS — R7303 Prediabetes: Secondary | ICD-10-CM | POA: Diagnosis not present

## 2021-01-24 DIAGNOSIS — L97819 Non-pressure chronic ulcer of other part of right lower leg with unspecified severity: Secondary | ICD-10-CM | POA: Diagnosis not present

## 2021-01-24 DIAGNOSIS — Z7901 Long term (current) use of anticoagulants: Secondary | ICD-10-CM | POA: Diagnosis not present

## 2021-01-24 DIAGNOSIS — N183 Chronic kidney disease, stage 3 unspecified: Secondary | ICD-10-CM | POA: Diagnosis not present

## 2021-01-24 DIAGNOSIS — I4819 Other persistent atrial fibrillation: Secondary | ICD-10-CM | POA: Diagnosis not present

## 2021-01-24 DIAGNOSIS — Z993 Dependence on wheelchair: Secondary | ICD-10-CM | POA: Diagnosis not present

## 2021-01-24 DIAGNOSIS — L97829 Non-pressure chronic ulcer of other part of left lower leg with unspecified severity: Secondary | ICD-10-CM | POA: Diagnosis not present

## 2021-01-24 DIAGNOSIS — I70248 Atherosclerosis of native arteries of left leg with ulceration of other part of lower left leg: Secondary | ICD-10-CM | POA: Diagnosis not present

## 2021-01-24 DIAGNOSIS — E785 Hyperlipidemia, unspecified: Secondary | ICD-10-CM | POA: Diagnosis not present

## 2021-01-24 DIAGNOSIS — R350 Frequency of micturition: Secondary | ICD-10-CM | POA: Diagnosis not present

## 2021-01-27 DIAGNOSIS — L97819 Non-pressure chronic ulcer of other part of right lower leg with unspecified severity: Secondary | ICD-10-CM | POA: Diagnosis not present

## 2021-01-27 DIAGNOSIS — G4700 Insomnia, unspecified: Secondary | ICD-10-CM | POA: Diagnosis not present

## 2021-01-27 DIAGNOSIS — Z87440 Personal history of urinary (tract) infections: Secondary | ICD-10-CM | POA: Diagnosis not present

## 2021-01-27 DIAGNOSIS — I4819 Other persistent atrial fibrillation: Secondary | ICD-10-CM | POA: Diagnosis not present

## 2021-01-27 DIAGNOSIS — I70248 Atherosclerosis of native arteries of left leg with ulceration of other part of lower left leg: Secondary | ICD-10-CM | POA: Diagnosis not present

## 2021-01-27 DIAGNOSIS — Z7901 Long term (current) use of anticoagulants: Secondary | ICD-10-CM | POA: Diagnosis not present

## 2021-01-27 DIAGNOSIS — N183 Chronic kidney disease, stage 3 unspecified: Secondary | ICD-10-CM | POA: Diagnosis not present

## 2021-01-27 DIAGNOSIS — R7303 Prediabetes: Secondary | ICD-10-CM | POA: Diagnosis not present

## 2021-01-27 DIAGNOSIS — L97519 Non-pressure chronic ulcer of other part of right foot with unspecified severity: Secondary | ICD-10-CM | POA: Diagnosis not present

## 2021-01-27 DIAGNOSIS — Z4800 Encounter for change or removal of nonsurgical wound dressing: Secondary | ICD-10-CM | POA: Diagnosis not present

## 2021-01-27 DIAGNOSIS — L97829 Non-pressure chronic ulcer of other part of left lower leg with unspecified severity: Secondary | ICD-10-CM | POA: Diagnosis not present

## 2021-01-27 DIAGNOSIS — I70238 Atherosclerosis of native arteries of right leg with ulceration of other part of lower right leg: Secondary | ICD-10-CM | POA: Diagnosis not present

## 2021-01-27 DIAGNOSIS — Z993 Dependence on wheelchair: Secondary | ICD-10-CM | POA: Diagnosis not present

## 2021-01-27 DIAGNOSIS — Z6822 Body mass index (BMI) 22.0-22.9, adult: Secondary | ICD-10-CM | POA: Diagnosis not present

## 2021-01-27 DIAGNOSIS — I5030 Unspecified diastolic (congestive) heart failure: Secondary | ICD-10-CM | POA: Diagnosis not present

## 2021-01-27 DIAGNOSIS — E785 Hyperlipidemia, unspecified: Secondary | ICD-10-CM | POA: Diagnosis not present

## 2021-01-27 DIAGNOSIS — R350 Frequency of micturition: Secondary | ICD-10-CM | POA: Diagnosis not present

## 2021-01-29 DIAGNOSIS — E785 Hyperlipidemia, unspecified: Secondary | ICD-10-CM | POA: Diagnosis not present

## 2021-01-29 DIAGNOSIS — R350 Frequency of micturition: Secondary | ICD-10-CM | POA: Diagnosis not present

## 2021-01-29 DIAGNOSIS — L97819 Non-pressure chronic ulcer of other part of right lower leg with unspecified severity: Secondary | ICD-10-CM | POA: Diagnosis not present

## 2021-01-29 DIAGNOSIS — L97829 Non-pressure chronic ulcer of other part of left lower leg with unspecified severity: Secondary | ICD-10-CM | POA: Diagnosis not present

## 2021-01-29 DIAGNOSIS — I5030 Unspecified diastolic (congestive) heart failure: Secondary | ICD-10-CM | POA: Diagnosis not present

## 2021-01-29 DIAGNOSIS — N183 Chronic kidney disease, stage 3 unspecified: Secondary | ICD-10-CM | POA: Diagnosis not present

## 2021-01-29 DIAGNOSIS — L97519 Non-pressure chronic ulcer of other part of right foot with unspecified severity: Secondary | ICD-10-CM | POA: Diagnosis not present

## 2021-01-29 DIAGNOSIS — G4700 Insomnia, unspecified: Secondary | ICD-10-CM | POA: Diagnosis not present

## 2021-01-29 DIAGNOSIS — Z993 Dependence on wheelchair: Secondary | ICD-10-CM | POA: Diagnosis not present

## 2021-01-29 DIAGNOSIS — I70248 Atherosclerosis of native arteries of left leg with ulceration of other part of lower left leg: Secondary | ICD-10-CM | POA: Diagnosis not present

## 2021-01-29 DIAGNOSIS — I70238 Atherosclerosis of native arteries of right leg with ulceration of other part of lower right leg: Secondary | ICD-10-CM | POA: Diagnosis not present

## 2021-01-29 DIAGNOSIS — R7303 Prediabetes: Secondary | ICD-10-CM | POA: Diagnosis not present

## 2021-01-29 DIAGNOSIS — Z6822 Body mass index (BMI) 22.0-22.9, adult: Secondary | ICD-10-CM | POA: Diagnosis not present

## 2021-01-29 DIAGNOSIS — Z87440 Personal history of urinary (tract) infections: Secondary | ICD-10-CM | POA: Diagnosis not present

## 2021-01-29 DIAGNOSIS — Z4800 Encounter for change or removal of nonsurgical wound dressing: Secondary | ICD-10-CM | POA: Diagnosis not present

## 2021-01-29 DIAGNOSIS — Z7901 Long term (current) use of anticoagulants: Secondary | ICD-10-CM | POA: Diagnosis not present

## 2021-01-29 DIAGNOSIS — I4819 Other persistent atrial fibrillation: Secondary | ICD-10-CM | POA: Diagnosis not present

## 2021-01-30 DIAGNOSIS — I5030 Unspecified diastolic (congestive) heart failure: Secondary | ICD-10-CM | POA: Diagnosis not present

## 2021-01-30 DIAGNOSIS — L89151 Pressure ulcer of sacral region, stage 1: Secondary | ICD-10-CM | POA: Diagnosis not present

## 2021-01-30 DIAGNOSIS — E785 Hyperlipidemia, unspecified: Secondary | ICD-10-CM | POA: Diagnosis not present

## 2021-01-30 DIAGNOSIS — L97819 Non-pressure chronic ulcer of other part of right lower leg with unspecified severity: Secondary | ICD-10-CM | POA: Diagnosis not present

## 2021-01-30 DIAGNOSIS — I70248 Atherosclerosis of native arteries of left leg with ulceration of other part of lower left leg: Secondary | ICD-10-CM | POA: Diagnosis not present

## 2021-01-30 DIAGNOSIS — I70238 Atherosclerosis of native arteries of right leg with ulceration of other part of lower right leg: Secondary | ICD-10-CM | POA: Diagnosis not present

## 2021-01-30 DIAGNOSIS — G4700 Insomnia, unspecified: Secondary | ICD-10-CM | POA: Diagnosis not present

## 2021-01-30 DIAGNOSIS — I4819 Other persistent atrial fibrillation: Secondary | ICD-10-CM | POA: Diagnosis not present

## 2021-01-30 DIAGNOSIS — R7303 Prediabetes: Secondary | ICD-10-CM | POA: Diagnosis not present

## 2021-01-30 DIAGNOSIS — Z87440 Personal history of urinary (tract) infections: Secondary | ICD-10-CM | POA: Diagnosis not present

## 2021-01-30 DIAGNOSIS — L97221 Non-pressure chronic ulcer of left calf limited to breakdown of skin: Secondary | ICD-10-CM | POA: Diagnosis not present

## 2021-01-30 DIAGNOSIS — L97519 Non-pressure chronic ulcer of other part of right foot with unspecified severity: Secondary | ICD-10-CM | POA: Diagnosis not present

## 2021-01-30 DIAGNOSIS — Z4800 Encounter for change or removal of nonsurgical wound dressing: Secondary | ICD-10-CM | POA: Diagnosis not present

## 2021-01-30 DIAGNOSIS — Z7901 Long term (current) use of anticoagulants: Secondary | ICD-10-CM | POA: Diagnosis not present

## 2021-01-30 DIAGNOSIS — N183 Chronic kidney disease, stage 3 unspecified: Secondary | ICD-10-CM | POA: Diagnosis not present

## 2021-01-30 DIAGNOSIS — Z993 Dependence on wheelchair: Secondary | ICD-10-CM | POA: Diagnosis not present

## 2021-01-30 DIAGNOSIS — L97829 Non-pressure chronic ulcer of other part of left lower leg with unspecified severity: Secondary | ICD-10-CM | POA: Diagnosis not present

## 2021-01-30 DIAGNOSIS — L97211 Non-pressure chronic ulcer of right calf limited to breakdown of skin: Secondary | ICD-10-CM | POA: Diagnosis not present

## 2021-01-30 DIAGNOSIS — Z6822 Body mass index (BMI) 22.0-22.9, adult: Secondary | ICD-10-CM | POA: Diagnosis not present

## 2021-01-30 DIAGNOSIS — R350 Frequency of micturition: Secondary | ICD-10-CM | POA: Diagnosis not present

## 2021-02-03 DIAGNOSIS — G4700 Insomnia, unspecified: Secondary | ICD-10-CM | POA: Diagnosis not present

## 2021-02-03 DIAGNOSIS — Z7901 Long term (current) use of anticoagulants: Secondary | ICD-10-CM | POA: Diagnosis not present

## 2021-02-03 DIAGNOSIS — L97519 Non-pressure chronic ulcer of other part of right foot with unspecified severity: Secondary | ICD-10-CM | POA: Diagnosis not present

## 2021-02-03 DIAGNOSIS — Z6822 Body mass index (BMI) 22.0-22.9, adult: Secondary | ICD-10-CM | POA: Diagnosis not present

## 2021-02-03 DIAGNOSIS — R350 Frequency of micturition: Secondary | ICD-10-CM | POA: Diagnosis not present

## 2021-02-03 DIAGNOSIS — L97819 Non-pressure chronic ulcer of other part of right lower leg with unspecified severity: Secondary | ICD-10-CM | POA: Diagnosis not present

## 2021-02-03 DIAGNOSIS — I4819 Other persistent atrial fibrillation: Secondary | ICD-10-CM | POA: Diagnosis not present

## 2021-02-03 DIAGNOSIS — I70248 Atherosclerosis of native arteries of left leg with ulceration of other part of lower left leg: Secondary | ICD-10-CM | POA: Diagnosis not present

## 2021-02-03 DIAGNOSIS — Z993 Dependence on wheelchair: Secondary | ICD-10-CM | POA: Diagnosis not present

## 2021-02-03 DIAGNOSIS — R7303 Prediabetes: Secondary | ICD-10-CM | POA: Diagnosis not present

## 2021-02-03 DIAGNOSIS — Z87440 Personal history of urinary (tract) infections: Secondary | ICD-10-CM | POA: Diagnosis not present

## 2021-02-03 DIAGNOSIS — Z4800 Encounter for change or removal of nonsurgical wound dressing: Secondary | ICD-10-CM | POA: Diagnosis not present

## 2021-02-03 DIAGNOSIS — I70238 Atherosclerosis of native arteries of right leg with ulceration of other part of lower right leg: Secondary | ICD-10-CM | POA: Diagnosis not present

## 2021-02-03 DIAGNOSIS — L97829 Non-pressure chronic ulcer of other part of left lower leg with unspecified severity: Secondary | ICD-10-CM | POA: Diagnosis not present

## 2021-02-03 DIAGNOSIS — N183 Chronic kidney disease, stage 3 unspecified: Secondary | ICD-10-CM | POA: Diagnosis not present

## 2021-02-03 DIAGNOSIS — E785 Hyperlipidemia, unspecified: Secondary | ICD-10-CM | POA: Diagnosis not present

## 2021-02-03 DIAGNOSIS — I5030 Unspecified diastolic (congestive) heart failure: Secondary | ICD-10-CM | POA: Diagnosis not present

## 2021-02-04 ENCOUNTER — Encounter (INDEPENDENT_AMBULATORY_CARE_PROVIDER_SITE_OTHER): Payer: Self-pay | Admitting: Nurse Practitioner

## 2021-02-04 ENCOUNTER — Ambulatory Visit (INDEPENDENT_AMBULATORY_CARE_PROVIDER_SITE_OTHER): Payer: Medicare Other | Admitting: Nurse Practitioner

## 2021-02-04 ENCOUNTER — Other Ambulatory Visit: Payer: Self-pay

## 2021-02-04 VITALS — BP 103/71 | HR 114 | Resp 16

## 2021-02-04 DIAGNOSIS — I5032 Chronic diastolic (congestive) heart failure: Secondary | ICD-10-CM

## 2021-02-04 DIAGNOSIS — E785 Hyperlipidemia, unspecified: Secondary | ICD-10-CM

## 2021-02-04 DIAGNOSIS — I739 Peripheral vascular disease, unspecified: Secondary | ICD-10-CM

## 2021-02-04 DIAGNOSIS — I890 Lymphedema, not elsewhere classified: Secondary | ICD-10-CM

## 2021-02-04 DIAGNOSIS — I10 Essential (primary) hypertension: Secondary | ICD-10-CM

## 2021-02-05 ENCOUNTER — Encounter: Payer: Self-pay | Admitting: Family

## 2021-02-05 ENCOUNTER — Ambulatory Visit (INDEPENDENT_AMBULATORY_CARE_PROVIDER_SITE_OTHER): Payer: Medicare Other | Admitting: Family

## 2021-02-05 VITALS — BP 98/68 | HR 99 | Ht 62.0 in | Wt 140.0 lb

## 2021-02-05 DIAGNOSIS — I4821 Permanent atrial fibrillation: Secondary | ICD-10-CM

## 2021-02-05 DIAGNOSIS — Z7901 Long term (current) use of anticoagulants: Secondary | ICD-10-CM | POA: Diagnosis not present

## 2021-02-05 DIAGNOSIS — R600 Localized edema: Secondary | ICD-10-CM

## 2021-02-05 DIAGNOSIS — I5032 Chronic diastolic (congestive) heart failure: Secondary | ICD-10-CM

## 2021-02-05 DIAGNOSIS — E785 Hyperlipidemia, unspecified: Secondary | ICD-10-CM

## 2021-02-05 DIAGNOSIS — I739 Peripheral vascular disease, unspecified: Secondary | ICD-10-CM | POA: Diagnosis not present
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
## 2021-02-05 NOTE — Patient Instructions (Addendum)
Medication Instructions:  Continue your current medications.   *If you need a refill on your cardiac medications before your next appointment, please call your pharmacy*   Lab Work: Your provider recommends lab work today: BMP, BNP  If you have labs (blood work) drawn today and your tests are completely normal, you will receive your results only by: Marland Kitchen MyChart Message (if you have MyChart) OR . A paper copy in the mail If you have any lab test that is abnormal or we need to change your treatment, we will call you to review the results.  Testing/Procedures: None ordered today.  If you notice confusion or have another fall and hit your end - recommend presenting to the ER for a CT scan.  Follow-Up: At [facility], you and your health needs are our priority.  As part of our continuing mission to provide you with exceptional heart care, we have created designated Provider Care Teams.  These Care Teams include your primary Cardiologist (physician) and Advanced Practice Providers (APPs -  Physician Assistants and Nurse Practitioners) who all work together to provide you with the care you need, when you need it.  We recommend signing up for the patient portal called "MyChart".  Sign up information is provided on this After Visit Summary.  MyChart is used to connect with patients for Virtual Visits (Telemedicine).  Patients are able to view lab/test results, encounter notes, upcoming appointments, etc.  Non-urgent messages can be sent to your provider as well.   To learn more about what you can do with MyChart, go to [email or web address].    Your next appointment:   2 month(s)  The format for your next appointment:   In Person  Provider:   You may see [name], MD or one of the following Advanced Practice Providers on your designated Care Team:    Robideau Hodgkins, NP  Christell Faith, PA-C  Marrianne Mood, PA-C  Cadence Kathlen Mody, [place]  Laurann Montana, NP  Other  Instructions  Recommend drinking less than 2 liters of fluid per day.  Recommend low salt diet.  Recommend weighing daily and keeping a log. Report weight gain of 2 pounds overnight or 5 pounds in 1 week.

## 2021-02-06 DIAGNOSIS — G4700 Insomnia, unspecified: Secondary | ICD-10-CM | POA: Diagnosis not present

## 2021-02-06 DIAGNOSIS — Z87440 Personal history of urinary (tract) infections: Secondary | ICD-10-CM | POA: Diagnosis not present

## 2021-02-06 DIAGNOSIS — L97819 Non-pressure chronic ulcer of other part of right lower leg with unspecified severity: Secondary | ICD-10-CM | POA: Diagnosis not present

## 2021-02-06 DIAGNOSIS — I5030 Unspecified diastolic (congestive) heart failure: Secondary | ICD-10-CM | POA: Diagnosis not present

## 2021-02-06 DIAGNOSIS — R7303 Prediabetes: Secondary | ICD-10-CM | POA: Diagnosis not present

## 2021-02-06 DIAGNOSIS — L97519 Non-pressure chronic ulcer of other part of right foot with unspecified severity: Secondary | ICD-10-CM | POA: Diagnosis not present

## 2021-02-06 DIAGNOSIS — Z7901 Long term (current) use of anticoagulants: Secondary | ICD-10-CM | POA: Diagnosis not present

## 2021-02-06 DIAGNOSIS — I70248 Atherosclerosis of native arteries of left leg with ulceration of other part of lower left leg: Secondary | ICD-10-CM | POA: Diagnosis not present

## 2021-02-06 DIAGNOSIS — L97829 Non-pressure chronic ulcer of other part of left lower leg with unspecified severity: Secondary | ICD-10-CM | POA: Diagnosis not present

## 2021-02-06 DIAGNOSIS — Z6822 Body mass index (BMI) 22.0-22.9, adult: Secondary | ICD-10-CM | POA: Diagnosis not present

## 2021-02-06 DIAGNOSIS — I70238 Atherosclerosis of native arteries of right leg with ulceration of other part of lower right leg: Secondary | ICD-10-CM | POA: Diagnosis not present

## 2021-02-06 DIAGNOSIS — I4819 Other persistent atrial fibrillation: Secondary | ICD-10-CM | POA: Diagnosis not present

## 2021-02-06 DIAGNOSIS — N183 Chronic kidney disease, stage 3 unspecified: Secondary | ICD-10-CM | POA: Diagnosis not present

## 2021-02-06 DIAGNOSIS — Z993 Dependence on wheelchair: Secondary | ICD-10-CM | POA: Diagnosis not present

## 2021-02-06 DIAGNOSIS — R350 Frequency of micturition: Secondary | ICD-10-CM | POA: Diagnosis not present

## 2021-02-06 DIAGNOSIS — Z4800 Encounter for change or removal of nonsurgical wound dressing: Secondary | ICD-10-CM | POA: Diagnosis not present

## 2021-02-06 DIAGNOSIS — E785 Hyperlipidemia, unspecified: Secondary | ICD-10-CM | POA: Diagnosis not present

## 2021-02-06 LAB — BASIC METABOLIC PANEL
BUN/Creatinine Ratio: 19 (ref 12–28)
BUN: 25 mg/dL (ref 10–36)
CO2: 19 mmol/L — ABNORMAL LOW (ref 20–29)
Calcium: 9.2 mg/dL (ref 8.7–10.3)
Chloride: 99 mmol/L (ref 96–106)
Creatinine, Ser: 1.34 mg/dL — ABNORMAL HIGH (ref 0.57–1.00)
Glucose: 82 mg/dL (ref 65–99)
Potassium: 4 mmol/L (ref 3.5–5.2)
Sodium: 138 mmol/L (ref 134–144)
eGFR: 38 mL/min/{1.73_m2} — ABNORMAL LOW (ref >59)

## 2021-02-06 LAB — BRAIN NATRIURETIC PEPTIDE: BNP: 584.6 pg/mL — ABNORMAL HIGH (ref 0.0–100.0)

## 2021-02-07 DIAGNOSIS — Z4800 Encounter for change or removal of nonsurgical wound dressing: Secondary | ICD-10-CM | POA: Diagnosis not present

## 2021-02-07 DIAGNOSIS — N183 Chronic kidney disease, stage 3 unspecified: Secondary | ICD-10-CM | POA: Diagnosis not present

## 2021-02-07 DIAGNOSIS — I70238 Atherosclerosis of native arteries of right leg with ulceration of other part of lower right leg: Secondary | ICD-10-CM | POA: Diagnosis not present

## 2021-02-07 DIAGNOSIS — G4700 Insomnia, unspecified: Secondary | ICD-10-CM | POA: Diagnosis not present

## 2021-02-07 DIAGNOSIS — Z6822 Body mass index (BMI) 22.0-22.9, adult: Secondary | ICD-10-CM | POA: Diagnosis not present

## 2021-02-07 DIAGNOSIS — Z87440 Personal history of urinary (tract) infections: Secondary | ICD-10-CM | POA: Diagnosis not present

## 2021-02-07 DIAGNOSIS — L97829 Non-pressure chronic ulcer of other part of left lower leg with unspecified severity: Secondary | ICD-10-CM | POA: Diagnosis not present

## 2021-02-07 DIAGNOSIS — R7303 Prediabetes: Secondary | ICD-10-CM | POA: Diagnosis not present

## 2021-02-07 DIAGNOSIS — Z993 Dependence on wheelchair: Secondary | ICD-10-CM | POA: Diagnosis not present

## 2021-02-07 DIAGNOSIS — L97519 Non-pressure chronic ulcer of other part of right foot with unspecified severity: Secondary | ICD-10-CM | POA: Diagnosis not present

## 2021-02-07 DIAGNOSIS — Z7901 Long term (current) use of anticoagulants: Secondary | ICD-10-CM | POA: Diagnosis not present

## 2021-02-07 DIAGNOSIS — L97819 Non-pressure chronic ulcer of other part of right lower leg with unspecified severity: Secondary | ICD-10-CM | POA: Diagnosis not present

## 2021-02-07 DIAGNOSIS — I70248 Atherosclerosis of native arteries of left leg with ulceration of other part of lower left leg: Secondary | ICD-10-CM | POA: Diagnosis not present

## 2021-02-07 DIAGNOSIS — R350 Frequency of micturition: Secondary | ICD-10-CM | POA: Diagnosis not present

## 2021-02-07 DIAGNOSIS — I5030 Unspecified diastolic (congestive) heart failure: Secondary | ICD-10-CM | POA: Diagnosis not present

## 2021-02-07 DIAGNOSIS — I4819 Other persistent atrial fibrillation: Secondary | ICD-10-CM | POA: Diagnosis not present

## 2021-02-07 DIAGNOSIS — E785 Hyperlipidemia, unspecified: Secondary | ICD-10-CM | POA: Diagnosis not present

## 2021-02-09 ENCOUNTER — Encounter (INDEPENDENT_AMBULATORY_CARE_PROVIDER_SITE_OTHER): Payer: Self-pay | Admitting: Nurse Practitioner
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
## 2021-02-10 DIAGNOSIS — L97519 Non-pressure chronic ulcer of other part of right foot with unspecified severity: Secondary | ICD-10-CM | POA: Diagnosis not present

## 2021-02-10 DIAGNOSIS — E785 Hyperlipidemia, unspecified: Secondary | ICD-10-CM | POA: Diagnosis not present

## 2021-02-10 DIAGNOSIS — I5030 Unspecified diastolic (congestive) heart failure: Secondary | ICD-10-CM | POA: Diagnosis not present

## 2021-02-10 DIAGNOSIS — R350 Frequency of micturition: Secondary | ICD-10-CM | POA: Diagnosis not present

## 2021-02-10 DIAGNOSIS — L97829 Non-pressure chronic ulcer of other part of left lower leg with unspecified severity: Secondary | ICD-10-CM | POA: Diagnosis not present

## 2021-02-10 DIAGNOSIS — Z993 Dependence on wheelchair: Secondary | ICD-10-CM | POA: Diagnosis not present

## 2021-02-10 DIAGNOSIS — I4819 Other persistent atrial fibrillation: Secondary | ICD-10-CM | POA: Diagnosis not present

## 2021-02-10 DIAGNOSIS — G4700 Insomnia, unspecified: Secondary | ICD-10-CM | POA: Diagnosis not present

## 2021-02-10 DIAGNOSIS — Z6822 Body mass index (BMI) 22.0-22.9, adult: Secondary | ICD-10-CM | POA: Diagnosis not present

## 2021-02-10 DIAGNOSIS — L97819 Non-pressure chronic ulcer of other part of right lower leg with unspecified severity: Secondary | ICD-10-CM | POA: Diagnosis not present

## 2021-02-10 DIAGNOSIS — Z4800 Encounter for change or removal of nonsurgical wound dressing: Secondary | ICD-10-CM | POA: Diagnosis not present

## 2021-02-10 DIAGNOSIS — Z87440 Personal history of urinary (tract) infections: Secondary | ICD-10-CM | POA: Diagnosis not present

## 2021-02-10 DIAGNOSIS — I70248 Atherosclerosis of native arteries of left leg with ulceration of other part of lower left leg: Secondary | ICD-10-CM | POA: Diagnosis not present

## 2021-02-10 DIAGNOSIS — I70238 Atherosclerosis of native arteries of right leg with ulceration of other part of lower right leg: Secondary | ICD-10-CM | POA: Diagnosis not present

## 2021-02-10 DIAGNOSIS — R7303 Prediabetes: Secondary | ICD-10-CM | POA: Diagnosis not present

## 2021-02-10 DIAGNOSIS — Z7901 Long term (current) use of anticoagulants: Secondary | ICD-10-CM | POA: Diagnosis not present

## 2021-02-10 DIAGNOSIS — N183 Chronic kidney disease, stage 3 unspecified: Secondary | ICD-10-CM | POA: Diagnosis not present

## 2021-02-11 ENCOUNTER — Telehealth: Payer: Self-pay | Admitting: *Deleted

## 2021-02-11 NOTE — Telephone Encounter (Signed)
Spoke to pt's son, Doren Custard (ok per DPR), notified of lab results and provider's recc.  Doren Custard verbalized understanding.  Pt will continue Lasix 40mg  daily, reduce salt intake, keep legs elevated, and is following up with vascular this Friday 4/1.  Pt will attempt to weight daily and will call the office with wt gain 2lbs overnight or 5lbs in one week.  Doren Custard has no further questions at this time.

## 2021-02-11 NOTE — Telephone Encounter (Signed)
-----   Message from Loel Dubonnet, NP sent at 02/07/2021  7:41 AM EDT ----- Kidney function slightly worse than previous so recommend continuing Lasix 40mg  daily and not increasing dose. BNP shows some fluid volume which is anticipated as she has known swelling. Recommend reducing salt intake, keeping legs elevated, and continuing to follow with vascular. Recommend weighing daily and reporting weight gain of 2lbs overnight or 5lbs in 1 week.  Please call result to son Doren Custard [phone]) or if unavailable to daughter-in-law Adonis Brook [phone]). Doren Custard on [place] given verbal permission to call by patient during office visit 02/05/21.

## 2021-02-12 DIAGNOSIS — N183 Chronic kidney disease, stage 3 unspecified: Secondary | ICD-10-CM | POA: Diagnosis not present

## 2021-02-12 DIAGNOSIS — I5030 Unspecified diastolic (congestive) heart failure: Secondary | ICD-10-CM | POA: Diagnosis not present

## 2021-02-12 DIAGNOSIS — E785 Hyperlipidemia, unspecified: Secondary | ICD-10-CM | POA: Diagnosis not present

## 2021-02-12 DIAGNOSIS — R7303 Prediabetes: Secondary | ICD-10-CM | POA: Diagnosis not present

## 2021-02-12 DIAGNOSIS — I4819 Other persistent atrial fibrillation: Secondary | ICD-10-CM | POA: Diagnosis not present

## 2021-02-12 DIAGNOSIS — I70248 Atherosclerosis of native arteries of left leg with ulceration of other part of lower left leg: Secondary | ICD-10-CM | POA: Diagnosis not present

## 2021-02-12 DIAGNOSIS — Z6822 Body mass index (BMI) 22.0-22.9, adult: Secondary | ICD-10-CM | POA: Diagnosis not present

## 2021-02-12 DIAGNOSIS — I70238 Atherosclerosis of native arteries of right leg with ulceration of other part of lower right leg: Secondary | ICD-10-CM | POA: Diagnosis not present

## 2021-02-12 DIAGNOSIS — Z993 Dependence on wheelchair: Secondary | ICD-10-CM | POA: Diagnosis not present

## 2021-02-12 DIAGNOSIS — Z87440 Personal history of urinary (tract) infections: Secondary | ICD-10-CM | POA: Diagnosis not present

## 2021-02-12 DIAGNOSIS — L97819 Non-pressure chronic ulcer of other part of right lower leg with unspecified severity: Secondary | ICD-10-CM | POA: Diagnosis not present

## 2021-02-12 DIAGNOSIS — G4700 Insomnia, unspecified: Secondary | ICD-10-CM | POA: Diagnosis not present

## 2021-02-12 DIAGNOSIS — L97829 Non-pressure chronic ulcer of other part of left lower leg with unspecified severity: Secondary | ICD-10-CM | POA: Diagnosis not present

## 2021-02-12 DIAGNOSIS — Z7901 Long term (current) use of anticoagulants: Secondary | ICD-10-CM | POA: Diagnosis not present

## 2021-02-12 DIAGNOSIS — L97519 Non-pressure chronic ulcer of other part of right foot with unspecified severity: Secondary | ICD-10-CM | POA: Diagnosis not present

## 2021-02-12 DIAGNOSIS — R350 Frequency of micturition: Secondary | ICD-10-CM | POA: Diagnosis not present

## 2021-02-12 DIAGNOSIS — Z4800 Encounter for change or removal of nonsurgical wound dressing: Secondary | ICD-10-CM | POA: Diagnosis not present

## 2021-02-13 ENCOUNTER — Other Ambulatory Visit (INDEPENDENT_AMBULATORY_CARE_PROVIDER_SITE_OTHER): Payer: Self-pay | Admitting: Nurse Practitioner

## 2021-02-13 DIAGNOSIS — I739 Peripheral vascular disease, unspecified: Secondary | ICD-10-CM

## 2021-02-13 DIAGNOSIS — I890 Lymphedema, not elsewhere classified: Secondary | ICD-10-CM

## 2021-02-14 ENCOUNTER — Other Ambulatory Visit: Payer: Self-pay

## 2021-02-14 ENCOUNTER — Ambulatory Visit (INDEPENDENT_AMBULATORY_CARE_PROVIDER_SITE_OTHER): Payer: Medicare Other | Admitting: Nurse Practitioner

## 2021-02-14 ENCOUNTER — Ambulatory Visit (INDEPENDENT_AMBULATORY_CARE_PROVIDER_SITE_OTHER): Payer: Medicare Other

## 2021-02-14 ENCOUNTER — Encounter (INDEPENDENT_AMBULATORY_CARE_PROVIDER_SITE_OTHER): Payer: Self-pay | Admitting: Nurse Practitioner

## 2021-02-14 VITALS — BP 92/74 | HR 123 | Resp 18 | Ht 62.0 in | Wt 140.0 lb

## 2021-02-14 DIAGNOSIS — E785 Hyperlipidemia, unspecified: Secondary | ICD-10-CM

## 2021-02-14 DIAGNOSIS — I739 Peripheral vascular disease, unspecified: Secondary | ICD-10-CM | POA: Diagnosis not present

## 2021-02-14 DIAGNOSIS — I890 Lymphedema, not elsewhere classified: Secondary | ICD-10-CM

## 2021-02-14 DIAGNOSIS — I5032 Chronic diastolic (congestive) heart failure: Secondary | ICD-10-CM

## 2021-02-15 ENCOUNTER — Encounter (INDEPENDENT_AMBULATORY_CARE_PROVIDER_SITE_OTHER): Payer: Self-pay | Admitting: Nurse Practitioner
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
## 2021-04-03 ENCOUNTER — Other Ambulatory Visit: Payer: Self-pay | Admitting: Internal Medicine

## 2021-04-10 ENCOUNTER — Ambulatory Visit: Payer: Medicare (Managed Care) | Admitting: Internal Medicine

## 2021-04-24 ENCOUNTER — Emergency Department: Payer: Medicare (Managed Care)

## 2021-04-24 ENCOUNTER — Inpatient Hospital Stay
Admission: EM | Admit: 2021-04-24 | Discharge: 2021-05-16 | DRG: 871 | Disposition: E | Payer: Medicare (Managed Care) | Attending: Pulmonary Disease | Admitting: Pulmonary Disease

## 2021-04-24 DIAGNOSIS — I959 Hypotension, unspecified: Secondary | ICD-10-CM

## 2021-04-24 DIAGNOSIS — Z79899 Other long term (current) drug therapy: Secondary | ICD-10-CM | POA: Diagnosis not present

## 2021-04-24 DIAGNOSIS — E872 Acidosis, unspecified: Secondary | ICD-10-CM

## 2021-04-24 DIAGNOSIS — A419 Sepsis, unspecified organism: Secondary | ICD-10-CM

## 2021-04-24 DIAGNOSIS — Z882 Allergy status to sulfonamides status: Secondary | ICD-10-CM | POA: Diagnosis not present

## 2021-04-24 DIAGNOSIS — E43 Unspecified severe protein-calorie malnutrition: Secondary | ICD-10-CM | POA: Diagnosis present

## 2021-04-24 DIAGNOSIS — A4150 Gram-negative sepsis, unspecified: Secondary | ICD-10-CM | POA: Diagnosis present

## 2021-04-24 DIAGNOSIS — R6520 Severe sepsis without septic shock: Secondary | ICD-10-CM | POA: Diagnosis not present

## 2021-04-24 DIAGNOSIS — Z823 Family history of stroke: Secondary | ICD-10-CM

## 2021-04-24 DIAGNOSIS — R34 Anuria and oliguria: Secondary | ICD-10-CM | POA: Diagnosis not present

## 2021-04-24 DIAGNOSIS — I890 Lymphedema, not elsewhere classified: Secondary | ICD-10-CM | POA: Diagnosis present

## 2021-04-24 DIAGNOSIS — I5032 Chronic diastolic (congestive) heart failure: Secondary | ICD-10-CM | POA: Diagnosis present

## 2021-04-24 DIAGNOSIS — E785 Hyperlipidemia, unspecified: Secondary | ICD-10-CM | POA: Diagnosis present

## 2021-04-24 DIAGNOSIS — L89152 Pressure ulcer of sacral region, stage 2: Secondary | ICD-10-CM | POA: Diagnosis present

## 2021-04-24 DIAGNOSIS — I110 Hypertensive heart disease with heart failure: Secondary | ICD-10-CM | POA: Diagnosis present

## 2021-04-24 DIAGNOSIS — Z20822 Contact with and (suspected) exposure to covid-19: Secondary | ICD-10-CM | POA: Diagnosis present

## 2021-04-24 DIAGNOSIS — Z7901 Long term (current) use of anticoagulants: Secondary | ICD-10-CM

## 2021-04-24 DIAGNOSIS — Z66 Do not resuscitate: Secondary | ICD-10-CM | POA: Diagnosis present

## 2021-04-24 DIAGNOSIS — I4891 Unspecified atrial fibrillation: Secondary | ICD-10-CM

## 2021-04-24 DIAGNOSIS — R4182 Altered mental status, unspecified: Secondary | ICD-10-CM | POA: Diagnosis not present

## 2021-04-24 DIAGNOSIS — L8990 Pressure ulcer of unspecified site, unspecified stage: Secondary | ICD-10-CM | POA: Diagnosis present

## 2021-04-24 DIAGNOSIS — G9341 Metabolic encephalopathy: Secondary | ICD-10-CM | POA: Diagnosis present

## 2021-04-24 DIAGNOSIS — R7881 Bacteremia: Secondary | ICD-10-CM | POA: Diagnosis present

## 2021-04-24 DIAGNOSIS — E162 Hypoglycemia, unspecified: Secondary | ICD-10-CM | POA: Diagnosis present

## 2021-04-24 DIAGNOSIS — Z515 Encounter for palliative care: Secondary | ICD-10-CM | POA: Diagnosis not present

## 2021-04-24 DIAGNOSIS — R54 Age-related physical debility: Secondary | ICD-10-CM | POA: Diagnosis present

## 2021-04-24 DIAGNOSIS — N179 Acute kidney failure, unspecified: Secondary | ICD-10-CM

## 2021-04-24 DIAGNOSIS — M7989 Other specified soft tissue disorders: Secondary | ICD-10-CM | POA: Diagnosis present

## 2021-04-24 DIAGNOSIS — E876 Hypokalemia: Secondary | ICD-10-CM | POA: Diagnosis present

## 2021-04-24 DIAGNOSIS — I482 Chronic atrial fibrillation, unspecified: Secondary | ICD-10-CM | POA: Diagnosis present

## 2021-04-24 DIAGNOSIS — Z6825 Body mass index (BMI) 25.0-25.9, adult: Secondary | ICD-10-CM | POA: Diagnosis not present

## 2021-04-24 DIAGNOSIS — R6521 Severe sepsis with septic shock: Secondary | ICD-10-CM | POA: Diagnosis present

## 2021-04-24 LAB — URINALYSIS, COMPLETE (UACMP) WITH MICROSCOPIC
Bilirubin Urine: NEGATIVE
Glucose, UA: NEGATIVE mg/dL
Hgb urine dipstick: NEGATIVE
Ketones, ur: NEGATIVE mg/dL
Nitrite: NEGATIVE
Protein, ur: 30 mg/dL — AB
Specific Gravity, Urine: 1.027 (ref 1.005–1.030)
pH: 5 (ref 5.0–8.0)

## 2021-04-24 LAB — CBC
HCT: 35.5 % — ABNORMAL LOW (ref 36.0–46.0)
Hemoglobin: 11.8 g/dL — ABNORMAL LOW (ref 12.0–15.0)
MCH: 35.8 pg — ABNORMAL HIGH (ref 26.0–34.0)
MCHC: 33.2 g/dL (ref 30.0–36.0)
MCV: 107.6 fL — ABNORMAL HIGH (ref 80.0–100.0)
Platelets: 217 10*3/uL (ref 150–400)
RBC: 3.3 MIL/uL — ABNORMAL LOW (ref 3.87–5.11)
RDW: 23.9 % — ABNORMAL HIGH (ref 11.5–15.5)
WBC: 15.2 10*3/uL — ABNORMAL HIGH (ref 4.0–10.5)
nRBC: 0 % (ref 0.0–0.2)

## 2021-04-24 LAB — CBG MONITORING, ED
Glucose-Capillary: 50 mg/dL — ABNORMAL LOW (ref 70–99)
Glucose-Capillary: 90 mg/dL (ref 70–99)
Glucose-Capillary: 85 mg/dL (ref 70–99)
Glucose-Capillary: 96 mg/dL (ref 70–99)

## 2021-04-24 LAB — RESP PANEL BY RT-PCR (FLU A&B, COVID) ARPGX2
Influenza A by PCR: NEGATIVE
Influenza B by PCR: NEGATIVE
SARS Coronavirus 2 by RT PCR: NEGATIVE

## 2021-04-24 LAB — COMPREHENSIVE METABOLIC PANEL
ALT: 10 U/L (ref 0–44)
AST: 35 U/L (ref 15–41)
Albumin: 2.1 g/dL — ABNORMAL LOW (ref 3.5–5.0)
Alkaline Phosphatase: 47 U/L (ref 38–126)
Anion gap: 14 (ref 5–15)
BUN: 39 mg/dL — ABNORMAL HIGH (ref 8–23)
CO2: 13 mmol/L — ABNORMAL LOW (ref 22–32)
Calcium: 8.2 mg/dL — ABNORMAL LOW (ref 8.9–10.3)
Chloride: 104 mmol/L (ref 98–111)
Creatinine, Ser: 1.61 mg/dL — ABNORMAL HIGH (ref 0.44–1.00)
GFR, Estimated: 30 mL/min — ABNORMAL LOW (ref >60)
Glucose, Bld: 119 mg/dL — ABNORMAL HIGH (ref 70–99)
Potassium: 3 mmol/L — ABNORMAL LOW (ref 3.5–5.1)
Sodium: 131 mmol/L — ABNORMAL LOW (ref 135–145)
Total Bilirubin: 1.6 mg/dL — ABNORMAL HIGH (ref 0.3–1.2)
Total Protein: 5.6 g/dL — ABNORMAL LOW (ref 6.5–8.1)

## 2021-04-24 LAB — PROTIME-INR
INR: 3.2 — ABNORMAL HIGH (ref 0.8–1.2)
Prothrombin Time: 32.9 seconds — ABNORMAL HIGH (ref 11.4–15.2)

## 2021-04-24 LAB — LACTIC ACID, PLASMA: Lactic Acid, Venous: 6 mmol/L (ref 0.5–1.9)

## 2021-04-24 MED ORDER — POLYETHYLENE GLYCOL 3350 17 G PO PACK: 17.0000 g | PACK | Freq: Every day | ORAL | Status: DC | PRN

## 2021-04-24 MED ORDER — PHENYLEPHRINE HCL-NACL 10-0.9 MG/250ML-% IV SOLN
25.0000 ug/min | INTRAVENOUS | Status: DC
  Administered 2021-04-25: 25 ug/min via INTRAVENOUS
  Administered 2021-04-25: 230 ug/min via INTRAVENOUS
  Administered 2021-04-25: 145 ug/min via INTRAVENOUS
  Administered 2021-04-25: 230 ug/min via INTRAVENOUS
  Filled 2021-04-24 (×4): qty 250

## 2021-04-24 MED ORDER — SODIUM CHLORIDE 0.9 % IV SOLN: 250.0000 mL | INTRAVENOUS | Status: DC

## 2021-04-24 MED ORDER — DOCUSATE SODIUM 100 MG PO CAPS: 100.0000 mg | ORAL_CAPSULE | Freq: Two times a day (BID) | ORAL | Status: DC | PRN

## 2021-04-24 MED ORDER — LACTATED RINGERS IV BOLUS
1000.0000 mL | Freq: Once | INTRAVENOUS | Status: AC
  Administered 2021-04-24: 1000 mL via INTRAVENOUS

## 2021-04-24 MED ORDER — METRONIDAZOLE 500 MG/100ML IV SOLN
500.0000 mg | Freq: Three times a day (TID) | INTRAVENOUS | Status: DC
  Administered 2021-04-25 (×2): 500 mg via INTRAVENOUS
  Filled 2021-04-24 (×5): qty 100

## 2021-04-24 MED ORDER — SODIUM CHLORIDE 0.9 % IV SOLN: 2.0000 g | Freq: Once | INTRAVENOUS | Status: DC

## 2021-04-24 MED ORDER — VANCOMYCIN HCL IN DEXTROSE 1-5 GM/200ML-% IV SOLN
1000.0000 mg | Freq: Once | INTRAVENOUS | Status: AC
  Administered 2021-04-24: 1000 mg via INTRAVENOUS
  Filled 2021-04-24: qty 200

## 2021-04-24 MED ORDER — SODIUM CHLORIDE 0.9 % IV SOLN
2.0000 g | Freq: Once | INTRAVENOUS | Status: AC
  Administered 2021-04-24: 2 g via INTRAVENOUS
  Filled 2021-04-24: qty 2

## 2021-04-24 MED ORDER — DEXTROSE 50 % IV SOLN: 1.0000 | Freq: Once | INTRAVENOUS | Status: AC

## 2021-04-24 MED ORDER — NOREPINEPHRINE 4 MG/250ML-% IV SOLN
INTRAVENOUS | Status: AC
  Administered 2021-04-24: 2 ug/min via INTRAVENOUS
  Filled 2021-04-24: qty 250

## 2021-04-24 MED ORDER — NOREPINEPHRINE 4 MG/250ML-% IV SOLN: 0.0000 ug/min | INTRAVENOUS | Status: DC

## 2021-04-24 MED ORDER — HYDROCORTISONE NA SUCCINATE PF 100 MG IJ SOLR
50.0000 mg | Freq: Four times a day (QID) | INTRAMUSCULAR | Status: DC
  Administered 2021-04-25 (×4): 50 mg via INTRAVENOUS
  Filled 2021-04-24 (×4): qty 2

## 2021-04-24 MED ORDER — DEXTROSE 50 % IV SOLN
INTRAVENOUS | Status: AC
  Administered 2021-04-24: 50 mL via INTRAVENOUS
  Filled 2021-04-24: qty 50

## 2021-04-24 MED ORDER — VANCOMYCIN HCL IN DEXTROSE 1-5 GM/200ML-% IV SOLN: 1000.0000 mg | Freq: Once | INTRAVENOUS | Status: DC

## 2021-04-24 MED ORDER — LACTATED RINGERS IV BOLUS (SEPSIS)
500.0000 mL | Freq: Once | INTRAVENOUS | Status: AC
  Administered 2021-04-25: 500 mL via INTRAVENOUS

## 2021-04-24 NOTE — ED Notes (Signed)
Dr. Archie Balboa notified blood glucose 50

## 2021-04-24 NOTE — ED Provider Notes (Signed)
[facility] Emergency Department Provider Note    ____________________________________________   I have reviewed the triage vital signs and the nursing notes.   HISTORY  Chief Complaint Altered Mental Status   History limited by and level 5 caveat due to: Altered Mental Status   HPI Kristen Graham is a 87 y.o. adult who presents to the emergency department today because of concerns for altered mental status.  Patient herself cannot give any history.  Apparently lives alone.  Was seen in the ER yesterday by primary care doctor's office.  Family does state they thought she might have a UTI.  Today when family checked on her they noticed her to be significantly more altered.    Records reviewed. Per medical record review patient has a history of HLD, HTN.  Past Medical History:  Diagnosis Date   Atrial fibrillation ([place]) 2020   Glaucoma    Hyperlipidemia    Hypertension    Left wrist fracture    Prediabetes     Patient Active Problem List   Diagnosis Date Noted   Altered mental status 10/24/2020   Leg edema 10/03/2020   Hyperlipidemia LDL goal <70 10/03/2020   Permanent atrial fibrillation ([place]) 08/15/2020   Hematuria 05/06/2020   PAD (peripheral artery disease) ([place]) 12/21/2019   Chronic heart failure with preserved ejection fraction (HFpEF) ([place]) 09/14/2019   Persistent atrial fibrillation ([place]) 08/29/2019   Leg swelling 08/29/2019   Skin ulcer of right calf, limited to breakdown of skin ([place]) 08/29/2019   Essential hypertension 08/29/2019   Open forearm fracture 04/06/2015    Past Surgical History:  Procedure Laterality Date   CATARACT EXTRACTION     I & D EXTREMITY Left 04/06/2015   Procedure: IRRIGATION AND DEBRIDEMENT left forearm;  Surgeon: Hessie Knows, MD;  Location: [facility] ORS;  Service: Orthopedics;  Laterality: Left;   I&D leg     ORIF RADIAL FRACTURE Left 04/06/2015   Procedure: OPEN REDUCTION INTERNAL FIXATION (ORIF) RADIAL  FRACTURE;  Surgeon: Hessie Knows, MD;  Location: [facility] ORS;  Service: Orthopedics;  Laterality: Left;    Prior to Admission medications   Medication Sig Start Date End Date Taking? Authorizing Provider  escitalopram (LEXAPRO) 5 MG tablet Take 5 mg by mouth daily. 11/19/20   [provider]  furosemide (LASIX) 40 MG tablet TAKE 1 TABLET BY MOUTH EVERY DAY 04/03/21   End, Harrell Gave, MD  ibuprofen (ADVIL) 400 MG tablet Take by mouth. Patient not taking: Reported on 02/14/2021    [provider]  metoprolol succinate (TOPROL-XL) 25 MG 24 hr tablet Take 1.5 tablets (37.5 mg total) by mouth in the morning and at bedtime. 01/06/21   End, Harrell Gave, MD  oxyCODONE (OXY IR/ROXICODONE) 5 MG immediate release tablet Take 1-2 tablets (5-10 mg total) by mouth every 4 (four) hours as needed for moderate pain. Patient not taking: Reported on 02/14/2021 04/08/15   Reche Dixon, PA-C  potassium chloride SA (KLOR-CON) 20 MEQ tablet Take 1 tablet (20 mEq total) by mouth daily. 10/23/20   End, Harrell Gave, MD  XARELTO 15 MG TABS tablet TAKE 1 TABLET BY MOUTH EVERY DAY WITH SUPPER 11/06/20   End, Harrell Gave, MD    Allergies Other and Sulfa antibiotics  Family History  Problem Relation Age of Onset   Stroke Mother    Arrhythmia Son     Social History Social History   Tobacco Use   Smoking status: Never   Smokeless tobacco: Never  Vaping Use   Vaping Use: Never  used  Substance Use Topics   Alcohol use: No   Drug use: Never    Review of Systems Unable to obtain reliable ROS secondary to AMS.   ____________________________________________   PHYSICAL EXAM:  VITAL SIGNS: ED Triage Vitals  Enc Vitals Group     BP 05/03/2021 2017 (!) 58/39     Pulse Rate 04/23/2021 2017 (!) 130     Resp 04/20/2021 2017 16     Temp 05/03/2021 2043 99.4 F (37.4 C)     Temp Source 05/02/2021 2043 Rectal     SpO2 04/16/2021 2013 94 %     Weight 04/23/2021 2018 138 lb 14.2 oz (63 kg)     Height 05/08/2021 2018 5'  2" (1.575 m)   Constitutional: Awake and alert. Oriented to name only. Eyes: Conjunctivae are normal.  ENT      Head: Normocephalic and atraumatic.      Nose: No congestion/rhinnorhea.      Mouth/Throat: Mucous membranes are moist.      Neck: No stridor. Hematological/Lymphatic/Immunilogical: No cervical lymphadenopathy. Cardiovascular: Tachycardic, irregular rhythm.  No murmurs, rubs, or gallops.  Respiratory: Normal respiratory effort without tachypnea nor retractions. Breath sounds are clear and equal bilaterally. No wheezes/rales/rhonchi. Gastrointestinal: Soft and non tender. No rebound. No guarding.  Genitourinary: Deferred Musculoskeletal: Normal range of motion in all extremities. No lower extremity edema. Neurologic:  Oriented to name only. Moving all extremities.  Skin:  Skin is warm, dry and intact. No rash noted. Psychiatric: Mood and affect are normal. Speech and behavior are normal.  ____________________________________________    LABS (pertinent positives/negatives)  COVID negative UA hazy, protein 30, trace leukocytes, 0-5 RBC, 6-10 WBC, many bacteria CMP na 131, k 3.0, glu 119, cr 1.61 INR 3.2 CBC wbc 15.2, hgb 11.8, plt 217  ____________________________________________   EKG  I, Nance Pear, attending physician, personally viewed and interpreted this EKG  EKG Time: 2025 Rate: 131 Rhythm: atrial fibrillation Axis: left axis deviation Intervals: qtc 411 QRS: LAFB ST changes: no st elevation Impression: abnormal ekg   ____________________________________________    RADIOLOGY  CXR Stable cardiomegaly. Development of small bilateral pleural effusions.  ____________________________________________   PROCEDURES  Procedures  CRITICAL CARE Performed by: Nance Pear   Total critical care time: 45 minutes  Critical care time was exclusive of separately billable procedures and treating other patients.  Critical care was necessary to  treat or prevent imminent or life-threatening deterioration.  Critical care was time spent personally by me on the following activities: development of treatment plan with patient and/or surrogate as well as nursing, discussions with consultants, evaluation of patient's response to treatment, examination of patient, obtaining history from patient or surrogate, ordering and performing treatments and interventions, ordering and review of laboratory studies, ordering and review of radiographic studies, pulse oximetry and re-evaluation of patient's condition.  ____________________________________________   INITIAL IMPRESSION / ASSESSMENT AND PLAN / ED COURSE  Pertinent labs & imaging results that were available during my care of the patient were reviewed by me and considered in my medical decision making (see chart for details).   Patient presented to the emergency department today because of concerns for altered mental status.  On initial exam patient was only alert to name.  Blood pressure was found to be significantly low.  She was immediately started on IV boluses.  Did hope that the patient's blood pressure would respond to fluids alone all for that did not seem to be the case.  Because of this she was  started on peripheral pressors.  Daughter did present to bedside and I had a frank discussion with the daughter.  At this time I do have concerns that this could be a terminal illness for the patient.  Patient is DNR/DNI.  Additionally the daughter states she would not be interested in any significant procedures.  We did discuss possible central line and daughter did not think the patient would be interested in the central line.  At this time will continue fluids pressors and patient was written for broad-spectrum antibiotics given concern for infection.  Will plan admission to the ICU.   ____________________________________________   FINAL CLINICAL IMPRESSION(S) / ED DIAGNOSES  Final diagnoses:   Hypotension, unspecified hypotension type  Altered mental status, unspecified altered mental status type     Note: This dictation was prepared with Dragon dictation. Any transcriptional errors that result from this process are unintentional     Nance Pear, MD 04/23/2021 2345

## 2021-04-24 NOTE — Progress Notes (Signed)
CODE SEPSIS - PHARMACY COMMUNICATION  **Broad Spectrum Antibiotics should be administered within 1 hour of Sepsis diagnosis**  Time Code Sepsis Called/Page Received: 6/9 @ 2300  Antibiotics Ordered: Vanc, Cefepime   Time of 1st antibiotic administration: Cefepime 2 gm IV X 1 @ 2110   Additional action taken by pharmacy:   If necessary, Name of Provider/Nurse Contacted:     Jalisa Sacco D ,PharmD Clinical Pharmacist  05/10/2021  11:12 PM

## 2021-04-24 NOTE — ED Notes (Signed)
Daughter and son at bedside.

## 2021-04-24 NOTE — ED Triage Notes (Signed)
EMS brought in for altered mental status. Son reported that patient lives alone, they checked on her around 3:30pm and patient LOC decreased. Seen at PCP yesterday and attempted to check for UTI, unable to obtain urine specimen according to EMS. Confused. Patient normally lives alone. Drowsy. Hx CHF/CKD.  EMS gave NS 414ml pta.

## 2021-04-24 NOTE — ED Notes (Signed)
Dr. Archie Balboa at bedside speaking with daughter

## 2021-04-25 DIAGNOSIS — A4150 Gram-negative sepsis, unspecified: Principal | ICD-10-CM

## 2021-04-25 DIAGNOSIS — A419 Sepsis, unspecified organism: Secondary | ICD-10-CM

## 2021-04-25 DIAGNOSIS — R6521 Severe sepsis with septic shock: Secondary | ICD-10-CM

## 2021-04-25 DIAGNOSIS — E43 Unspecified severe protein-calorie malnutrition: Secondary | ICD-10-CM | POA: Diagnosis present

## 2021-04-25 DIAGNOSIS — R4182 Altered mental status, unspecified: Secondary | ICD-10-CM

## 2021-04-25 DIAGNOSIS — R6520 Severe sepsis without septic shock: Secondary | ICD-10-CM

## 2021-04-25 DIAGNOSIS — L8990 Pressure ulcer of unspecified site, unspecified stage: Secondary | ICD-10-CM | POA: Diagnosis present

## 2021-04-25 LAB — LACTIC ACID, PLASMA
Lactic Acid, Venous: 3.9 mmol/L (ref 0.5–1.9)
Lactic Acid, Venous: >11 mmol/L (ref 0.5–1.9)
Lactic Acid, Venous: >11 mmol/L (ref 0.5–1.9)

## 2021-04-25 LAB — BLOOD GAS, VENOUS
Acid-base deficit: 16.3 mmol/L — ABNORMAL HIGH (ref 0.0–2.0)
Bicarbonate: 10 mmol/L — ABNORMAL LOW (ref 20.0–28.0)
O2 Saturation: 61.7 %
Patient temperature: 37
pCO2, Ven: 25 mmHg — ABNORMAL LOW (ref 44.0–60.0)
pH, Ven: 7.21 — ABNORMAL LOW (ref 7.250–7.430)
pO2, Ven: 40 mmHg (ref 32.0–45.0)

## 2021-04-25 LAB — BLOOD CULTURE ID PANEL (REFLEXED) - BCID2

## 2021-04-25 LAB — MRSA PCR SCREENING: MRSA by PCR: NEGATIVE

## 2021-04-25 LAB — TYPE AND SCREEN
ABO/RH(D): O POS
Antibody Screen: NEGATIVE

## 2021-04-25 LAB — CBC
HCT: 35.1 % — ABNORMAL LOW (ref 36.0–46.0)
Hemoglobin: 11.5 g/dL — ABNORMAL LOW (ref 12.0–15.0)
MCH: 35 pg — ABNORMAL HIGH (ref 26.0–34.0)
MCHC: 32.8 g/dL (ref 30.0–36.0)
MCV: 106.7 fL — ABNORMAL HIGH (ref 80.0–100.0)
Platelets: 255 10*3/uL (ref 150–400)
RBC: 3.29 MIL/uL — ABNORMAL LOW (ref 3.87–5.11)
RDW: 23.1 % — ABNORMAL HIGH (ref 11.5–15.5)
WBC: 31.5 10*3/uL — ABNORMAL HIGH (ref 4.0–10.5)
nRBC: 0 % (ref 0.0–0.2)

## 2021-04-25 LAB — BASIC METABOLIC PANEL
Anion gap: 14 (ref 5–15)
BUN: 42 mg/dL — ABNORMAL HIGH (ref 8–23)
CO2: 13 mmol/L — ABNORMAL LOW (ref 22–32)
Calcium: 7.7 mg/dL — ABNORMAL LOW (ref 8.9–10.3)
Chloride: 106 mmol/L (ref 98–111)
Creatinine, Ser: 1.68 mg/dL — ABNORMAL HIGH (ref 0.44–1.00)
GFR, Estimated: 29 mL/min — ABNORMAL LOW (ref >60)
Glucose, Bld: 85 mg/dL (ref 70–99)
Potassium: 4 mmol/L (ref 3.5–5.1)
Sodium: 133 mmol/L — ABNORMAL LOW (ref 135–145)

## 2021-04-25 LAB — T4, FREE: Free T4: 0.89 ng/dL (ref 0.61–1.12)

## 2021-04-25 LAB — TROPONIN I (HIGH SENSITIVITY)
Troponin I (High Sensitivity): 60 ng/L — ABNORMAL HIGH (ref <18)
Troponin I (High Sensitivity): <2 ng/L (ref <18)

## 2021-04-25 LAB — TSH: TSH: 13.018 u[IU]/mL — ABNORMAL HIGH (ref 0.350–4.500)

## 2021-04-25 LAB — PROCALCITONIN: Procalcitonin: 9.6 ng/mL

## 2021-04-25 LAB — CBG MONITORING, ED
Glucose-Capillary: 75 mg/dL (ref 70–99)
Glucose-Capillary: 70 mg/dL (ref 70–99)
Glucose-Capillary: 89 mg/dL (ref 70–99)

## 2021-04-25 LAB — PHOSPHORUS: Phosphorus: 4.1 mg/dL (ref 2.5–4.6)

## 2021-04-25 LAB — MAGNESIUM: Magnesium: 1.7 mg/dL (ref 1.7–2.4)

## 2021-04-25 LAB — CORTISOL: Cortisol, Plasma: 29.9 ug/dL

## 2021-04-25 LAB — GLUCOSE, CAPILLARY
Glucose-Capillary: 120 mg/dL — ABNORMAL HIGH (ref 70–99)
Glucose-Capillary: 59 mg/dL — ABNORMAL LOW (ref 70–99)

## 2021-04-25 MED ORDER — DEXTROSE 5 % IV SOLN: Freq: Once | INTRAVENOUS | Status: AC

## 2021-04-25 MED ORDER — GLYCOPYRROLATE 0.2 MG/ML IJ SOLN
0.2000 mg | INTRAMUSCULAR | Status: DC | PRN
  Filled 2021-04-25: qty 1

## 2021-04-25 MED ORDER — NOREPINEPHRINE 16 MG/250ML-% IV SOLN
0.0000 ug/min | INTRAVENOUS | Status: DC
  Filled 2021-04-25: qty 250

## 2021-04-25 MED ORDER — CHLORHEXIDINE GLUCONATE 0.12 % MT SOLN
15.0000 mL | Freq: Two times a day (BID) | OROMUCOSAL | Status: DC
  Administered 2021-04-25: 15 mL via OROMUCOSAL
  Filled 2021-04-25: qty 15

## 2021-04-25 MED ORDER — SODIUM BICARBONATE 8.4 % IV SOLN
100.0000 meq | Freq: Once | INTRAVENOUS | Status: AC
  Administered 2021-04-25: 100 meq via INTRAVENOUS
  Filled 2021-04-25: qty 50

## 2021-04-25 MED ORDER — MORPHINE 100MG IN NS 100ML (1MG/ML) PREMIX INFUSION
1.0000 mg/h | INTRAVENOUS | Status: DC
Start: 2021-04-25 — End: 2021-04-27
  Administered 2021-04-25: 4 mg/h via INTRAVENOUS
  Administered 2021-04-26: 5 mg/h via INTRAVENOUS
  Filled 2021-04-25 (×2): qty 100

## 2021-04-25 MED ORDER — LORAZEPAM 2 MG/ML IJ SOLN: 1.0000 mg | INTRAMUSCULAR | Status: DC | PRN

## 2021-04-25 MED ORDER — CHLORHEXIDINE GLUCONATE CLOTH 2 % EX PADS
6.0000 | MEDICATED_PAD | Freq: Every day | CUTANEOUS | Status: DC
  Administered 2021-04-25: 6 via TOPICAL
  Filled 2021-04-25: qty 6

## 2021-04-25 MED ORDER — PHENYLEPHRINE CONCENTRATED 100MG/250ML (0.4 MG/ML) INFUSION SIMPLE
0.0000 ug/min | INTRAVENOUS | Status: DC
  Administered 2021-04-25: 20 ug/min via INTRAVENOUS
  Administered 2021-04-25: 380 ug/min via INTRAVENOUS
  Administered 2021-04-25: 290 ug/min via INTRAVENOUS
  Filled 2021-04-25 (×3): qty 250

## 2021-04-25 MED ORDER — VANCOMYCIN HCL IN DEXTROSE 1-5 GM/200ML-% IV SOLN: 1000.0000 mg | INTRAVENOUS | Status: DC

## 2021-04-25 MED ORDER — LACTATED RINGERS IV BOLUS
1000.0000 mL | Freq: Once | INTRAVENOUS | Status: AC
  Administered 2021-04-25: 1000 mL via INTRAVENOUS

## 2021-04-25 MED ORDER — ORAL CARE MOUTH RINSE
15.0000 mL | Freq: Two times a day (BID) | OROMUCOSAL | Status: DC
  Administered 2021-04-25 (×2): 15 mL via OROMUCOSAL
  Filled 2021-04-25 (×2): qty 15

## 2021-04-25 MED ORDER — LACTATED RINGERS IV BOLUS
500.0000 mL | Freq: Once | INTRAVENOUS | Status: AC
  Administered 2021-04-25: 500 mL via INTRAVENOUS

## 2021-04-25 MED ORDER — SODIUM BICARBONATE 8.4 % IV SOLN
INTRAVENOUS | Status: DC
  Filled 2021-04-25 (×2): qty 1000
  Filled 2021-04-25: qty 150

## 2021-04-25 MED ORDER — VANCOMYCIN HCL 500 MG/100ML IV SOLN
500.0000 mg | Freq: Once | INTRAVENOUS | Status: AC
  Administered 2021-04-25: 500 mg via INTRAVENOUS
  Filled 2021-04-25: qty 100

## 2021-04-25 MED ORDER — VASOPRESSIN 20 UNITS/100 ML INFUSION FOR SHOCK
0.0000 [IU]/min | INTRAVENOUS | Status: DC
  Administered 2021-04-25 (×2): 0.03 [IU]/min via INTRAVENOUS
  Filled 2021-04-25 (×2): qty 100

## 2021-04-25 MED ORDER — MORPHINE BOLUS VIA INFUSION
1.0000 mg | INTRAVENOUS | Status: DC | PRN
  Filled 2021-04-25: qty 2

## 2021-04-25 MED ORDER — POTASSIUM CHLORIDE 10 MEQ/100ML IV SOLN
10.0000 meq | INTRAVENOUS | Status: AC
  Administered 2021-04-25 (×4): 10 meq via INTRAVENOUS
  Filled 2021-04-25 (×4): qty 100

## 2021-04-25 MED ORDER — SODIUM CHLORIDE 0.9 % IV SOLN
2.0000 g | INTRAVENOUS | Status: DC
  Filled 2021-04-25: qty 2

## 2021-04-25 NOTE — Progress Notes (Signed)
Pt is being made comfort care. Family are on their way to visit the pt prior to withdrawing care and pressors. Mitts removed for comfort.

## 2021-04-25 NOTE — Progress Notes (Signed)
[facility] for Electrolyte Monitoring and Replacement   Recent Labs: Potassium (mmol/L)  Date Value  04/25/2021 4.0   Magnesium (mg/dL)  Date Value  04/25/2021 1.7   Calcium (mg/dL)  Date Value  04/25/2021 7.7 (L)   Albumin (g/dL)  Date Value  05/07/2021 2.1 (L)   Phosphorus (mg/dL)  Date Value  04/25/2021 4.1   Sodium (mmol/L)  Date Value  04/25/2021 133 (L)  02/05/2021 138     Assessment: 87 year old female admitted with AMS. History of atrial fibrillation on Xarelto. Pharmacy consult for electrolyte management.  Goal of Therapy:  Electrolytes WNL  Plan:  No replacement indicated at this time. Continue to follow along.  Tawnya Crook ,PharmD Clinical Pharmacist 04/25/2021 3:07 PM

## 2021-04-25 NOTE — Consult Note (Signed)
WOC Nurse Consult Note: Patient receiving care in [facility].   Reason for Consult: multiple wounds Wound type: I spoke with primary RN, Raquel Sarna, and learned that the patient has scattered, superficial oozing areas many places on the body.  Photos are available in the EMR of the legs. Pressure Injury POA: Yes/No/NA Measurement: Wound bed: Drainage (amount, consistency, odor)  Periwound: Dressing procedure/placement/frequency:  Place size appropriate pieces of vaseline gauze, or xeroform gauze, over open weeping areas, then cover with a foam dressing.  Change dressings every 3 days and prn. Thank you for the consult.  Discussed plan of care with the bedside nurse.  [place] nurse will not follow at this time.  Please re-consult the [place] [place] team if needed.  Val Riles, RN, MSN, CWOCN, CNS-BC, pager [phone]

## 2021-04-25 NOTE — Progress Notes (Signed)
PHARMACY CONSULT NOTE - FOLLOW UP  Pharmacy Consult for Electrolyte Monitoring and Replacement   Recent Labs: Potassium (mmol/L)  Date Value  04/23/2021 3.0 (L)   Calcium (mg/dL)  Date Value  05/07/2021 8.2 (L)   Albumin (g/dL)  Date Value  04/19/2021 2.1 (L)   Sodium (mmol/L)  Date Value  05/14/2021 131 (L)  02/05/2021 138     Assessment: 6/9:  K @ 2159 = 3.0  Goal of Therapy:  Electrolytes WNL   Plan:  KCl 10 mEq IV X 4 and recheck electrolytes on 6/10 with AM labs.   Orene Desanctis ,PharmD Clinical Pharmacist 04/25/2021 12:19 AM

## 2021-04-25 NOTE — ED Notes (Addendum)
Hinton Dyer, NP and Stark Klein, NP at bedside. Instructed to titrate phenylephrine infusion to 251mcg/min at this time.

## 2021-04-25 NOTE — H&P (Signed)
NAME:  Kristen Graham, MRN:  [id number], DOB:  Nov 04, 1929, LOS: 1 ADMISSION DATE:  05/01/2021, CONSULTATION DATE:  05/12/2021 REFERRING MD: Nance Pear MD, CHIEF COMPLAINT:  Altered Mental Status   History of present illness   87 year old female with significant past medical history as below presenting to the ED with chief complaint of altered mental status.  Patient apparently lives alone per the son who is currently at the bedside.  Patient's son stated that he checked on her around 3:30 PM on 04/18/2021 and found her confused with decreased LOC.  She was seen by her PCP the previous day for possible UTI but they were unable to obtain urine specimen according to EMS.  ED Course: On arrival to the ED, she had low grade fever 99.4 with blood pressure (!) 58/39 mm Hg and pulse rate 130 beats/min. There were no focal neurological deficits; she was alert and oriented  to self only.  Labs revealed WBC 15.2, hemoglobin 11.8, hematocrit 35.5, MCV 107.6 sodium 131, potassium 3.0, BUN 39, creatinine 1.61, calcium 8.2 albumin 2.1 lactic acid 6.0, procalcitonin 9.6, PT 32.9, INR 3.2, glucose 50. COVID negative, UA with trace leuks and many bacteria.  Chest x-ray showed interval development of small bilateral pleural effusion.  Stable cardiomegaly.  Due to concerns for possible sepsis with septic shock patient was resuscitated with IV fluids and started on vasopressor for hypotension and empiric antibiotic coverage for sepsis of unknown source.  PCCM consulted for further management  Past Medical History  Chronic heart failure with preserved ejection fraction (HFpEF) (HCC) Chronic atrial fibrillation -stopped Eliquis 05/03/2021 Hypertension Hyperlipidemia Chronic bilateral lower extremity leg swelling PAD Mitral, tricuspid, aortic regurgitation right right popliteal artery stenosis Prediabetes Glaucoma Left wrist fracture Significant Hospital Events   6/9: Admitted to ICU with sepsis/septic  shock  Consults:  Wound  Procedures:  6/10: Right Femoral Central Line  Significant Diagnostic Tests:  6/9: Chest Xray>showed interval development of small bilateral pleural effusion.  Stable cardiomegaly  Micro Data:  6/9: SARS-CoV-2 PCR>> negative 6/9: Influenza PCR>> negative 6/9: Blood culture x2>> 6/9: Urine Culture>> 6/9: MRSA PCR>>   Antimicrobials:  Vancomycin 6/9>> Cefepime 6/9> Metronidazole 6/9>  OBJECTIVE  Blood pressure (!) 65/41, pulse (!) 128, temperature 98.9 F (37.2 C), temperature source Rectal, resp. rate 20, height 5\' 2"  (1.575 m), weight 63 kg, SpO2 97 %.        Intake/Output Summary (Last 24 hours) at 04/25/2021 0517 Last data filed at 04/25/2021 0505 Gross per 24 hour  Intake 3479.66 ml  Output --  Net 3479.66 ml   Filed Weights   05/06/2021 2018  Weight: 63 kg     Physical Examination  GENERAL: 87 year-old critically ill patient lying in the bed with no acute distress.  EYES: Pupils equal, round, reactive to light and accommodation. No scleral icterus. Extraocular muscles intact.  HEENT: Head atraumatic, normocephalic. Oropharynx and nasopharynx clear.  NECK:  Supple, no jugular venous distention. No thyroid enlargement, no tenderness.  LUNGS: Normal breath sounds bilaterally, no wheezing, rales,rhonchi or crepitation. No use of accessory muscles of respiration.  CARDIOVASCULAR: S1, S2 normal. No murmurs, rubs, or gallops.  ABDOMEN: Soft, nontender, nondistended. Bowel sounds present. No organomegaly or mass.  EXTREMITIES: See below  NEUROLOGIC: Cranial nerves II through XII are intact.  Muscle strength 5/5 in all extremities. Sensation intact. Gait not checked.  PSYCHIATRIC: The patient is alert and oriented x 1.  SKIN:         Labs/imaging that I  havepersonally reviewed  (right click and "Reselect all SmartList Selections" daily)    EKG Time: 0300 Rate: 131 Rhythm: atrial fibrillation Axis: left axis deviation Intervals: qtc  411 QRS: LAFB ST changes: no st elevation Impression: abnormal ekg  Labs   CBC: Recent Labs  Lab 04/21/2021 2036 04/25/21 0444  WBC 15.2* 31.5*  HGB 11.8* 11.5*  HCT 35.5* 35.1*  MCV 107.6* 106.7*  PLT 217 427    Basic Metabolic Panel: Recent Labs  Lab 05/02/2021 2159 04/25/21 0444  NA 131* 133*  K 3.0* 4.0  CL 104 106  CO2 13* 13*  GLUCOSE 119* 85  BUN 39* 42*  CREATININE 1.61* 1.68*  CALCIUM 8.2* 7.7*  MG  --  1.7  PHOS  --  4.1   GFR: Estimated Creatinine Clearance (by C-G formula based on SCr of 1.68 mg/dL (H)) Female: 19.4 mL/min (A) Female: 22.6 mL/min (A) Recent Labs  Lab 04/30/2021 2036 04/23/2021 2303 04/25/21 0007 04/25/21 0101 04/25/21 0444  PROCALCITON  --   --  9.60  --   --   WBC 15.2*  --   --   --  31.5*  LATICACIDVEN  --  6.0*  --  3.9*  --     Liver Function Tests: Recent Labs  Lab 04/19/2021 2159  AST 35  ALT 10  ALKPHOS 47  BILITOT 1.6*  PROT 5.6*  ALBUMIN 2.1*   No results for input(s): LIPASE, AMYLASE in the last 168 hours. No results for input(s): AMMONIA in the last 168 hours.  ABG    Component Value Date/Time   HCO3 10.0 (L) 04/25/2021 0347   ACIDBASEDEF 16.3 (H) 04/25/2021 0347   O2SAT 61.7 04/25/2021 0347     Coagulation Profile: Recent Labs  Lab 04/18/2021 2057  INR 3.2*    Cardiac Enzymes: No results for input(s): CKTOTAL, CKMB, CKMBINDEX, TROPONINI in the last 168 hours.  HbA1C: No results found for: HGBA1C  CBG: Recent Labs  Lab 05/12/2021 2049 05/03/2021 2108 04/22/2021 2142 04/16/2021 2310 04/25/21 0419  GLUCAP 50* 90 85 96 75    Review of Systems:   UNABLE TO ASSESS DUE TO ALTERED MENTAL STATUS  Past Medical History  She,  has a past medical history of Atrial fibrillation ([place]) (2020), Glaucoma, Hyperlipidemia, Hypertension, Left wrist fracture, and Prediabetes.   Surgical History    Past Surgical History:  Procedure Laterality Date   CATARACT EXTRACTION     I & D EXTREMITY Left 04/06/2015    Procedure: IRRIGATION AND DEBRIDEMENT left forearm;  Surgeon: Hessie Knows, MD;  Location: [facility] ORS;  Service: Orthopedics;  Laterality: Left;   I&D leg     ORIF RADIAL FRACTURE Left 04/06/2015   Procedure: OPEN REDUCTION INTERNAL FIXATION (ORIF) RADIAL FRACTURE;  Surgeon: Hessie Knows, MD;  Location: [facility] ORS;  Service: Orthopedics;  Laterality: Left;     Social History   reports that she has never smoked. She has never used smokeless tobacco. She reports that she does not drink alcohol and does not use drugs.   Family History   Her family history includes Arrhythmia in her son; Stroke in her mother.   Allergies Allergies  Allergen Reactions   Other Nausea Only   Sulfa Antibiotics Nausea Only     Home Medications  Prior to Admission medications   Medication Sig Start Date End Date Taking? Authorizing Provider  amoxicillin (AMOXIL) 500 MG tablet Take 500 mg by mouth 2 (two) times daily. 04/11/21   [provider]  digoxin Fonnie [place])  0.125 MG tablet Take 125 mcg by mouth once a week. 04/04/21   [provider]  doxycycline (VIBRA-TABS) 100 MG tablet Take 100 mg by mouth 2 (two) times daily. 04/11/21   [provider]  escitalopram (LEXAPRO) 5 MG tablet Take 5 mg by mouth daily. 11/19/20   [provider]  furosemide (LASIX) 20 MG tablet Take 20 mg by mouth daily. 02/19/21   [provider]  furosemide (LASIX) 40 MG tablet TAKE 1 TABLET BY MOUTH EVERY DAY 04/03/21   End, Harrell Gave, MD  ibuprofen (ADVIL) 400 MG tablet Take by mouth. Patient not taking: Reported on 02/14/2021    [provider]  metoprolol succinate (TOPROL-XL) 25 MG 24 hr tablet Take 1.5 tablets (37.5 mg total) by mouth in the morning and at bedtime. 01/06/21   End, Harrell Gave, MD  MONUROL 3 g PACK Take 3 g by mouth once. 04/07/21   [provider]  oxyCODONE (OXY IR/ROXICODONE) 5 MG immediate release tablet Take 1-2 tablets (5-10 mg total) by mouth every 4 (four) hours  as needed for moderate pain. Patient not taking: Reported on 02/14/2021 04/08/15   Reche Dixon, PA-C  potassium chloride SA (KLOR-CON) 20 MEQ tablet Take 1 tablet (20 mEq total) by mouth daily. 10/23/20   End, Harrell Gave, MD  XARELTO 15 MG TABS tablet TAKE 1 TABLET BY MOUTH EVERY DAY WITH SUPPER 11/06/20   End, Harrell Gave, MD    Scheduled Meds:  hydrocortisone sod succinate (SOLU-CORTEF) inj  50 mg Intravenous Q6H   Continuous Infusions:  sodium chloride     ceFEPime (MAXIPIME) IV     metronidazole Stopped (04/25/21 0223)   phenylephrine (NEO-SYNEPHRINE) Adult infusion Stopped (04/25/21 0405)   phenylephrine (NEO-SYNEPHRINE) Adult infusion 230 mcg/min (04/25/21 0505)   sodium bicarbonate 150 mEq in D5W infusion     [START ON 05-02-21] vancomycin     vasopressin 0.03 Units/min (04/25/21 0342)   PRN Meds:.docusate sodium, polyethylene glycol   Active Hospital Problem list   Severe sepsis with septic shock A. fib with RVR Acute metabolic encephalopathy Acute Kidney Injury,  Metabolic Acidosis in the setting of lactic acidosis and AKI Chronic HFpEF Hypoglycemia  Assessment & Plan:  Sepsis  with septic shock due to suspected UTI and Bilateral Lower Leg cellulitis? Lactic: 6.0, Baseline PCT: 9.6, UA:? Culture pending, CXR: Negative,  Initial interventions/workup included: 2 L of LR bolus & Cefepime/ Vancomycin/ Metronidazole -Supplemental oxygen as needed, to maintain SpO2 > 90% -f/u cultures, trend lactic/ PC -monitor WBC/ fever curve -IV antibiotics: Continue broad spectrum coverage with cefepime/vancomycin/metronidazole -IVF hydration as needed:  -Continue vasopressors to maintain MAP > 65, phenylephrine and vasopressin -Strict I/O's: Goal UOP >0.5 mL/kg/hr -Continue stress dose steroids   Acute Kidney Injury, pre-renal in the setting of hypotension Metabolic Acidosis in the setting of lactic acidosis and AKI Hypokalemia -Monitor I&O's / urinary output -Follow BMP -Ensure  adequate renal perfusion -Avoid nephrotoxic agents as able -Replace electrolytes as indicated -Continue Bicarb gtt  A. fib with RVR Hx: Chronic A-fib, -Rate currently in the 130s consider amiodarone drip if persistent -On Xarelto apparently stopped by PCP   Chronic HFpEF (last known EF 60 to 65%) -Hypertension Hx: HLD  -Continuous cardiac monitoring -Maintain MAP greater than 65 -Hold Lasix -Hold metoprolol and digoxin -Repeat 2D Echocardiogram   -Acute Metabolic Encephalopathy due sepsis  -Maintain a RASS goal of -1 to -2 -Avoid sedating meds as able -Provide supportive care -Treat metabolic derangements    Hypoglycemia Hx: Prediabetes -CBGs -Sliding scale  insulin -Follow ICU hyper/hypoglycemia protocol  Chronic bilateral lower extremity lymphedema Hx: PAD -Wound consult   Best practice:  Diet:  Oral Pain/Anxiety/Delirium protocol (if indicated): No VAP protocol (if indicated): Not indicated DVT prophylaxis: Contraindicated GI prophylaxis: PPI Glucose control:  SSI No Central venous access:  Yes, and it is still needed Arterial line:  N/A Foley:  Yes, and it is still needed Mobility:  bed rest  PT consulted: N/A Last date of multidisciplinary goals of care discussion []  Code Status:  DNR Disposition: ICU  Critical care time: 45 mins     Rufina Falco, DNP, CCRN, FNP-C, AGACNP-BC Acute Care Nurse Practitioner  [facility] Pager: [phone] [place] [facility]  .

## 2021-04-25 NOTE — Progress Notes (Signed)
SLP Cancellation Note  Patient Details Name: ANJALI MANZELLA MRN: [id number] DOB: 1929/10/23   Cancelled treatment:       Reason Eval/Treat Not Completed: Patient not medically ready (chart reviewed; observed pt in bed) Per chart/lab notes, pt has elevated WBC, bun/creatinine, and lactic acid(11.0-high panic). Per the monitor, HR elevated and maintained b/t 117-126 at rest in bed for 3+ mins w/ RR in mid-20s. Also noted low RBC and hemoglobin levels. She was often mumbling/moaning appearing uncomfortable and restless in bed.   Recommend holding on BSE at this time w/ f/u (tomorrow) when medical status is more stable and pt is calmer for safe, engaged participation in evaluation. Recommend frequent oral care at this time for hygiene and stimulation of swallowing. NSG updated.      Orinda Kenner, MS, CCC-SLP Speech Language Pathologist Rehab Services [phone] [facility] 04/25/2021, 12:50 PM

## 2021-04-25 NOTE — Progress Notes (Signed)
After further discussion with the patient's daughter and patient's PACE program physician, the patient will be incision to comfort care.  She is not doing well with gram-negative sepsis.  She is not responding to pressors.  Continues to be exceedingly confused and encephalopathic.  Patient is very frail and family is clear that she is to be DNR/DNI.  They are more concerned with the patient's comfort.  Transition to comfort care will be done once all family members present.  Renold Don, MD [place] [facility]

## 2021-04-25 NOTE — Procedures (Signed)
Central Venous Catheter Insertion Procedure Note  STACY SAILER  [id number]  05/01/1929  Date:04/25/21  Time:5:15 AM   Provider Performing:Stokes Rattigan A Miciah Shealy   Procedure: Insertion of Non-tunneled Central Venous [phone]) with US guidance (88502)   Indication(s) Medication administration and Difficult access  Consent Risks of the procedure as well as the alternatives and risks of each were explained to the patient and/or caregiver.  Consent for the procedure was obtained and is signed in the bedside chart  Anesthesia Topical only with 1% lidocaine   Timeout Verified patient identification, verified procedure, site/side was marked, verified correct patient position, special equipment/implants available, medications/allergies/relevant history reviewed, required imaging and test results available.  Sterile Technique Maximal sterile technique including full sterile barrier drape, hand hygiene, sterile gown, sterile gloves, mask, hair covering, sterile ultrasound probe cover (if used).  Procedure Description Area of catheter insertion was cleaned with chlorhexidine and draped in sterile fashion.  With real-time ultrasound guidance a central venous catheter was placed into the right femoral vein. Nonpulsatile blood flow and easy flushing noted in all ports.  The catheter was sutured in place and sterile dressing applied.  Complications/Tolerance None; patient tolerated the procedure well. Chest X-ray is ordered to verify placement for internal jugular or subclavian cannulation.   Chest x-ray is not ordered for femoral cannulation.  EBL Minimal  Specimen(s) None    Rufina Falco, DNP, CCRN, FNP-C, AGACNP-BC Acute Care Nurse Practitioner  [facility] Pager: [phone] [place] [facility]

## 2021-04-25 NOTE — Progress Notes (Signed)
PHARMACY - PHYSICIAN COMMUNICATION CRITICAL VALUE ALERT - BLOOD CULTURE IDENTIFICATION (BCID)  Kristen Graham is an 87 y.o. adult who presented to [facility] on 05/08/2021 with a chief complaint of altered mental status  Assessment:  6/9 blood cultures with GNR, nothing detected on BCID.  Source not clear, ? UTI or skin/soft tissue   Name of physician (or Provider) Contacted: Dr Patsey Berthold  Current antibiotics: vancomycin, cefepime, metronidazole  Changes to prescribed antibiotics recommended:  Recommendations accepted by provider - stop vancomycin and monitor on cefepime/metronidazole  Results for orders placed or performed during the hospital encounter of 04/30/2021  Blood Culture ID Panel (Reflexed) (Collected: 04/25/2021  8:57 PM)  Result Value Ref Range   Enterococcus faecalis NOT DETECTED NOT DETECTED   Enterococcus Faecium NOT DETECTED NOT DETECTED   Listeria monocytogenes NOT DETECTED NOT DETECTED   Staphylococcus species NOT DETECTED NOT DETECTED   Staphylococcus aureus (BCID) NOT DETECTED NOT DETECTED   Staphylococcus epidermidis NOT DETECTED NOT DETECTED   Staphylococcus lugdunensis NOT DETECTED NOT DETECTED   Streptococcus species NOT DETECTED NOT DETECTED   Streptococcus agalactiae NOT DETECTED NOT DETECTED   Streptococcus pneumoniae NOT DETECTED NOT DETECTED   Streptococcus pyogenes NOT DETECTED NOT DETECTED   A.calcoaceticus-baumannii NOT DETECTED NOT DETECTED   Bacteroides fragilis NOT DETECTED NOT DETECTED   Enterobacterales NOT DETECTED NOT DETECTED   Enterobacter cloacae complex NOT DETECTED NOT DETECTED   Escherichia coli NOT DETECTED NOT DETECTED   Klebsiella aerogenes NOT DETECTED NOT DETECTED   Klebsiella oxytoca NOT DETECTED NOT DETECTED   Klebsiella pneumoniae NOT DETECTED NOT DETECTED   Proteus species NOT DETECTED NOT DETECTED   Salmonella species NOT DETECTED NOT DETECTED   Serratia marcescens NOT DETECTED NOT DETECTED   Haemophilus influenzae NOT DETECTED  NOT DETECTED   Neisseria meningitidis NOT DETECTED NOT DETECTED   Pseudomonas aeruginosa NOT DETECTED NOT DETECTED   Stenotrophomonas maltophilia NOT DETECTED NOT DETECTED   Candida albicans NOT DETECTED NOT DETECTED   Candida auris NOT DETECTED NOT DETECTED   Candida glabrata NOT DETECTED NOT DETECTED   Candida krusei NOT DETECTED NOT DETECTED   Candida parapsilosis NOT DETECTED NOT DETECTED   Candida tropicalis NOT DETECTED NOT DETECTED   Cryptococcus neoformans/gattii NOT DETECTED NOT DETECTED   Doreene Eland, PharmD, BCPS.   Work Cell: [phone] 04/25/2021 4:04 PM

## 2021-04-25 NOTE — ED Notes (Signed)
[place], NP at bedside for central line placement. Ultrasound and central line cart at bedside

## 2021-04-25 NOTE — Progress Notes (Signed)
Pharmacy Antibiotic Note  Kristen Graham is a 87 y.o. adult admitted on 04/23/2021 with sepsis.  Pharmacy has been consulted for Cefepime, Vancomycin dosing.  Plan: Cefepime 2 gm IV X 1 given in ED on 6/9 @ 2100. Cefepime 2 gm IV Q24H ordered to continue on 6/10 @ 2100.  Vancomycin 1 gm IV X 1 given in ED on 6/9 @ 2200. Additional Vanc 500 mg IV X 1 ordered to make total loading dose of 1500 mg. Vancomycin 1 gm IV Q48H ordered to start on 6/9 @ 2200.   AUC = 561.1  Vanc trough = 14.3   Height: 5\' 2"  (157.5 cm) Weight: 63 kg (138 lb 14.2 oz) IBW/kg (Calculated) : 50.1  Temp (24hrs), Avg:99.4 F (37.4 C), Min:99.4 F (37.4 C), Max:99.4 F (37.4 C)  Recent Labs  Lab 04/30/2021 2036 04/23/2021 2159 05/15/2021 2303  WBC 15.2*  --   --   CREATININE  --  1.61*  --   LATICACIDVEN  --   --  6.0*    Estimated Creatinine Clearance (by C-G formula based on SCr of 1.61 mg/dL (H)) Female: 20.3 mL/min (A) Female: 23.6 mL/min (A)    Allergies  Allergen Reactions   Other Nausea Only   Sulfa Antibiotics Nausea Only    Antimicrobials this admission:   >>    >>   Dose adjustments this admission:   Microbiology results:  BCx:   UCx:    Sputum:    MRSA PCR:   Thank you for allowing pharmacy to be a part of this patient's care.  Zilphia Kozinski D 04/25/2021 12:28 AM

## 2021-04-25 NOTE — ED Notes (Signed)
Verbal order obtained from [facility] NP to restart norepinephrine infusion at 78mcg/min at this time. Stark Klein, NP at bedside.

## 2021-04-25 NOTE — Progress Notes (Addendum)
Initial Nutrition Assessment  DOCUMENTATION CODES:  Severe malnutrition in context of chronic illness  INTERVENTION:  Recommend NPO until cleared for PO diet by SLP If approved for thin liquids, recommend Ensure Enlive po TID, each supplement provides 350 kcal and 20 grams of protein Once diet advanced, recommend Magic cup TID with meals, each supplement provides 290 kcal and 9 grams of protein Nursing to assist with feeding if needed once diet advanced Obtain measured weight  NUTRITION DIAGNOSIS:  Severe Malnutrition (in the context of chronic illness) related to  (inadequate oral intake) as evidenced by severe fat depletion, severe muscle depletion.  GOAL:  Patient will meet greater than or equal to 90% of their needs  MONITOR:  PO intake, Labs, Weight trends, Skin  REASON FOR ASSESSMENT:  Consult Wound healing  ASSESSMENT:  Pt brought to ED for AMS. Found to be septic in ED with extreme hypotension and elevated lactic acid/procalcitonin. Admitted to ICU. PMH relevant for CHF, HTN, HLD, PAD, atrial fibrillation  Pt resting in bed at the time of visit. Awake and alert, but speech is garbled, unable to decipher most of pt's answers. No family present at bedside. Pt quite thin with significant fat and muscle deficits to the top half of her body. Edema present to bilateral legs masking muscle loss. Will request new measured weight. Doubt accuracy of listed weight in chart (140 lb) even with edema present.  Discussed in IDT rounds, SLP evaluation to be requested prior to giving POs. RN reports that at baseline, pt lives alone. Lactic Acid elevated and pt requiring pressor support.   Nutritionally Relevant Medications: Continuous Infusions:  phenylephrine (NEO-SYNEPHRINE) Adult infusion 300 mcg/min (04/25/21 1257)   sodium bicarbonate 150 mEq in D5W infusion 100 mL/hr at 04/25/21 1257   [START ON 2021/05/05] vancomycin     vasopressin 0.03 Units/min (04/25/21 1257)   PRN Meds:  docusate sodium, polyethylene glycol  Labs Reviewed: Na 133 BUN 42, creatinine 1.68 Lactic Acid >11 SBG ranges from 59-120 mg/dL over the last 24 hours  NUTRITION - FOCUSED PHYSICAL EXAM: Flowsheet Row Most Recent Value  Orbital Region Severe depletion  Upper Arm Region Moderate depletion  Thoracic and Lumbar Region Moderate depletion  Buccal Region Severe depletion  Temple Region Severe depletion  Clavicle Bone Region Severe depletion  Clavicle and Acromion Bone Region Severe depletion  Scapular Bone Region Severe depletion  Dorsal Hand Severe depletion  Patellar Region Unable to assess  Anterior Thigh Region Unable to assess  Posterior Calf Region Unable to assess  Edema (RD Assessment) Moderate  Hair Reviewed  Eyes Reviewed  Mouth Reviewed  Skin Reviewed  [bruising and scattered sores]  Nails Reviewed   Diet Order:   Diet Order             Diet 2 gram sodium Room service appropriate? Yes; Fluid consistency: Thin  Diet effective now                   EDUCATION NEEDS:  No education needs have been identified at this time  Skin:  Skin Assessment: Reviewed RN Assessment  Last BM:  6/10 - type 2, per RN documentation  Height:  Ht Readings from Last 1 Encounters:  04/25/21 5\' 2"  (1.575 m)    Weight:  Wt Readings from Last 1 Encounters:  04/25/21 63.8 kg    Ideal Body Weight:  50 kg  BMI:  Body mass index is 25.73 kg/m.  Estimated Nutritional Needs:   Kcal:  1600-1800 kcal/d  Protein:  80-90g/d  Fluid:  1.6-1.8L/d  Ranell Patrick, RD, LDN Clinical Dietitian Pager on [place]

## 2021-04-26 DIAGNOSIS — N179 Acute kidney failure, unspecified: Secondary | ICD-10-CM

## 2021-04-26 DIAGNOSIS — R7881 Bacteremia: Secondary | ICD-10-CM | POA: Diagnosis present

## 2021-04-26 DIAGNOSIS — E872 Acidosis, unspecified: Secondary | ICD-10-CM

## 2021-04-26 DIAGNOSIS — I4891 Unspecified atrial fibrillation: Secondary | ICD-10-CM

## 2021-04-26 DIAGNOSIS — G9341 Metabolic encephalopathy: Secondary | ICD-10-CM

## 2021-04-26 DIAGNOSIS — E162 Hypoglycemia, unspecified: Secondary | ICD-10-CM

## 2021-04-27 LAB — CULTURE, BLOOD (ROUTINE X 2)

## 2021-05-16 ENCOUNTER — Ambulatory Visit (INDEPENDENT_AMBULATORY_CARE_PROVIDER_SITE_OTHER): Payer: Medicare Other | Admitting: Nurse Practitioner

## 2021-05-16 NOTE — Progress Notes (Signed)
   05-12-21 0800  SLP Visit Information  SLP Received On 2021-05-12  Reason Eval/Treat Not Completed SLP screened, no needs identified, will sign off. Per charting, pt made comfort care yesterday.    Deneise Lever, MS, Actor

## 2021-05-16 NOTE — Progress Notes (Signed)
Call from weekend on call NP for [facility] - Deneise Lever [phone]). Provided update from chart.   Oleh Genin, [place]

## 2021-05-16 NOTE — Progress Notes (Signed)
Family here with patient. Called nurse in. Pt has no respirations, heart rate, chest rise, pupils are fixed, skin is mottling. No reaction to touch of eye lids. Pt on comfort care. Another nurse and I pronounced deceased. Physician and house supervisor notified. Funeral home notified. Pt cleaned and placed in body bag with appropriate tags. Iv, central line, foley removed.

## 2021-05-16 NOTE — Progress Notes (Signed)
NAME:  Kristen Graham, MRN:  [id number], DOB:  10-15-1929, LOS: 2 ADMISSION DATE:  05/08/2021, CONSULTATION DATE:  05/11/2021 REFERRING MD: Nance Pear MD, CHIEF COMPLAINT:  Altered Mental Status   History of present illness   87 year old female with significant past medical history as below presenting to the ED with chief complaint of altered mental status.  Patient apparently lives alone per the son who is currently at the bedside.  Patient's son stated that he checked on her around 3:30 PM on 04/23/2021 and found her confused with decreased LOC.  She was seen by her PCP the previous day for possible UTI but they were unable to obtain urine specimen according to EMS.  ED Course: On arrival to the ED, she had low grade fever 99.4 with blood pressure (!) 58/39 mm Hg and pulse rate 130 beats/min. There were no focal neurological deficits; she was alert and oriented  to self only.  Labs revealed WBC 15.2, hemoglobin 11.8, hematocrit 35.5, MCV 107.6 sodium 131, potassium 3.0, BUN 39, creatinine 1.61, calcium 8.2 albumin 2.1 lactic acid 6.0, procalcitonin 9.6, PT 32.9, INR 3.2, glucose 50. COVID negative, UA with trace leuks and many bacteria.  Chest x-ray showed interval development of small bilateral pleural effusion.  Stable cardiomegaly.  Due to concerns for possible sepsis with septic shock patient was resuscitated with IV fluids and started on vasopressor for hypotension and empiric antibiotic coverage for sepsis of unknown source.  PCCM consulted for further management  Past Medical History  Chronic heart failure with preserved ejection fraction (HFpEF) (HCC) Chronic atrial fibrillation -stopped Eliquis 05/03/2021 Hypertension Hyperlipidemia Chronic bilateral lower extremity leg swelling PAD Mitral, tricuspid, aortic regurgitation right right popliteal artery stenosis Prediabetes Glaucoma Left wrist fracture Significant Hospital Events   6/9: Admitted to ICU with sepsis/septic shock 6/10:  Refractory shock, patient transitioned to comfort measures 6/11: Patient on comfort measures, comfortable  Consults:  Wound  Procedures:  6/10: Right Femoral Central Line  Significant Diagnostic Tests:  6/9: Chest Xray>showed interval development of small bilateral pleural effusion.  Stable cardiomegaly  Micro Data:  6/9: SARS-CoV-2 PCR>> negative 6/9: Influenza PCR>> negative 6/9: Blood culture x2>>MYROIDES SPECIES 6/9: Urine Culture>> unable to obtain 6/9: MRSA PCR>> negative  Antimicrobials:  Vancomycin 6/9>> 6/10 Cefepime 6/9> 6/10 Metronidazole 6/9> 6/10  OBJECTIVE  Blood pressure (!) 84/53, pulse 100, temperature (!) 97.5 F (36.4 C), temperature source Axillary, resp. rate 20, height 5\' 2"  (1.575 m), weight 63.8 kg, SpO2 100 %.        Intake/Output Summary (Last 24 hours) at 05-01-21 0815 Last data filed at 04/25/2021 1723 Gross per 24 hour  Intake 2396.93 ml  Output --  Net 2396.93 ml    Filed Weights   04/25/2021 2018 04/25/21 1000  Weight: 63 kg 63.8 kg     Physical Examination  GENERAL: 87 year-old terminally ill appearing female, jaundiced, bradypneic EYES: Eyes sunken HEENT: Head atraumatic, normocephalic.  Dry oral mucosa. NECK:  Supple, no jugular venous distention. No thyroid enlargement, no tenderness.  LUNGS: Bradypneic, occasional apneic episodes, no increased work of breathing. CARDIOVASCULAR: RRR ABDOMEN: Soft, nontender, nondistended.  EXTREMITIES: Chronic cellulitic changes  NEUROLOGIC: Unresponsive SKIN: Poor skin turgor  Labs/imaging that I havepersonally reviewed  (right click and "Reselect all SmartList Selections" daily)    No labs since yesterday, on comfort measures  Labs   CBC: Recent Labs  Lab 05/07/2021 2036 04/25/21 0444  WBC 15.2* 31.5*  HGB 11.8* 11.5*  HCT 35.5* 35.1*  MCV 107.6*  106.7*  PLT 217 255     Basic Metabolic Panel: Recent Labs  Lab 05/02/2021 2159 04/25/21 0444  NA 131* 133*  K 3.0* 4.0  CL 104  106  CO2 13* 13*  GLUCOSE 119* 85  BUN 39* 42*  CREATININE 1.61* 1.68*  CALCIUM 8.2* 7.7*  MG  --  1.7  PHOS  --  4.1    GFR: Estimated Creatinine Clearance (by C-G formula based on SCr of 1.68 mg/dL (H)) Female: 19.5 mL/min (A) Female: 22.6 mL/min (A) Recent Labs  Lab 04/23/2021 2036 05/11/2021 2303 04/25/21 0007 04/25/21 0101 04/25/21 0444 04/25/21 1059 04/25/21 1356  PROCALCITON  --   --  9.60  --   --   --   --   WBC 15.2*  --   --   --  31.5*  --   --   LATICACIDVEN  --  6.0*  --  3.9*  --  >11.0* >11.0*     Liver Function Tests: Recent Labs  Lab 04/29/2021 2159  AST 35  ALT 10  ALKPHOS 47  BILITOT 1.6*  PROT 5.6*  ALBUMIN 2.1*    No results for input(s): LIPASE, AMYLASE in the last 168 hours. No results for input(s): AMMONIA in the last 168 hours.  ABG    Component Value Date/Time   HCO3 10.0 (L) 04/25/2021 0347   ACIDBASEDEF 16.3 (H) 04/25/2021 0347   O2SAT 61.7 04/25/2021 0347      Coagulation Profile: Recent Labs  Lab 04/28/2021 2057  INR 3.2*     Cardiac Enzymes: No results for input(s): CKTOTAL, CKMB, CKMBINDEX, TROPONINI in the last 168 hours.  HbA1C: No results found for: HGBA1C  CBG: Recent Labs  Lab 04/25/21 0419 04/25/21 0714 04/25/21 0848 04/25/21 0955 04/25/21 0958  GLUCAP 75 70 89 59* 120*     Review of Systems:   Unable to assess due to terminal obtundation  Allergies Allergies  Allergen Reactions   Other Nausea Only   Sulfa Antibiotics Nausea Only     Scheduled Meds:  mouth rinse  15 mL Mouth Rinse q12n4p   Continuous Infusions:  sodium chloride Stopped (04/25/21 6269)   morphine 5 mg/hr (May 12, 2021 0801)   PRN Meds:.docusate sodium, glycopyrrolate, LORazepam, morphine   Active Hospital Problem list   Severe sepsis with septic shock MYROIDES bacteremia A. fib with RVR Acute metabolic encephalopathy Acute Kidney Injury,  Metabolic Acidosis in the setting of lactic acidosis and AKI Chronic  HFpEF Hypoglycemia  Assessment & Plan:  Sepsis  with septic shock due to suspected UTI and Bilateral Lower Leg cellulitis Organism in blood identified as MYROIDES species Multiorgan failure due to same Myroides species are opportunistic Usually affect immune compromised patients Highly resistant organisms to current antibiotics Patient with persistent septic shock and organ failure Has been transitioned to comfort care  The following is a list of additional assessments.  Plan continues to be continuation of comfort care: Acute Kidney Injury, pre-renal in the setting of hypotension Metabolic Acidosis in the setting of lactic acidosis and AKI Hypokalemia-corrected Patient now anuric Preterminal  A. fib with RVR Hx: Chronic A-fib,  Chronic HFpEF (last known EF 60 to 65%) -Hypertension Hx: HLD   Acute Metabolic Encephalopathy due sepsis/multiorgan failure Patient terminal    Hypoglycemia Hx: Prediabetes   Chronic bilateral lower extremity lymphedema Possible cellulitis May have been entry site for [facility] species  Patient currently in dying process.  She is comfortable and with terminal symptoms well controlled.  Son and daughter at  bedside satisfied with care.    Level 2 follow-up     C. Derrill Kay, MD [place] [facility]   *This note was dictated using voice recognition software/Dragon.  Despite best efforts to proofread, errors can occur which can change the meaning.  Any change was purely unintentional. .

## 2021-05-16 NOTE — Progress Notes (Signed)
Pt transferred to floor. Comfort care measures in place. Agonal breathing noted. Will monitor.

## 2021-05-16 NOTE — Progress Notes (Signed)
Pt's resting comfortably in bed with family at bedside. NAD noted. Agonal RR noted approximately 10 seconds apart. Family given emotional support at this time. No concerns posed to this RN.

## 2021-05-16 NOTE — Death Summary Note (Signed)
DEATH SUMMARY   Patient Details  Name: Kristen Graham MRN: [id number] DOB: August 25, 1929  Admission/Discharge Information   Admit Date:  May 23, 2021  Date of Death: Date of Death: 2021-05-25  Time of Death: Time of Death: 03-02-53  Length of Stay: 2  Referring Physician: System, Provider Not In   Reason(s) for Hospitalization  Severe sepsis with septic shock  Diagnoses  Preliminary cause of death:   Severe sepsis with septic shock due to myroides bacteremia Secondary Diagnoses (including complications and co-morbidities):  Active Problems:   Leg swelling   Chronic heart failure with preserved ejection fraction (HFpEF) (HCC)   Severe sepsis with septic shock (HCC)   Protein-calorie malnutrition, severe   Pressure injury of skin   Gram-negative bacteremia   Atrial fibrillation with rapid ventricular response (HCC)   Acute metabolic encephalopathy   Acute kidney injury ([place])   Hypoglycemia   Metabolic acidosis   Brief Hospital Course (including significant findings, care, treatment, and services provided and events leading to death)  Kristen Graham is a 87 y.o. year old adult who presented to [facility] ED from home with chief complaint of altered mental status.  The patient lives alone and her son checked on her on 05/23/21 finding her confused with decreased LOC.  She had been seen by her PCP on 04/23/2021 for a possible UTI but they were unable to obtain a urine specimen per EMS.  On arrival to ED she had a low-grade fever 99.4 and was hypotensive with a BP 58/39.  She was also found to be in atrial fibrillation with rapid ventricular response.  The patient was alert and oriented to self only in the emergency department. Labs revealed leukocytosis, hypokalemia, elevated BUN and creatinine consistent with AKI, lactic acidosis at 6, elevated procalcitonin at 9.6 and hypoglycemia.  Urinalysis had trace leuks and many bacteria, chest x-ray showing interval development of a small bilateral pleural  effusions.  The patient was resuscitated with IV fluids, given empiric antibiotics and started on vasopressors for severe sepsis with septic shock and was admitted to ICU.  On 04/25/2021 patient continued to be in refractory shock due to gram-negative sepsis.  The patient continued to be exceedingly confused and encephalopathic.  Family confirmed she is a DNR/DNI and the decision was made to transition to comfort measures only.  On 2021/05/25 patient was transferred to medical surgical floor with comfort measures in place and family bedside. On 05/25/2021 at March 02, 2153 the patient passed away with family bedside.   Pertinent Labs and Studies  Significant Diagnostic Studies DG Chest Portable 1 View  Result Date: 2021/05/23 CLINICAL DATA:  Sepsis EXAM: PORTABLE CHEST 1 VIEW COMPARISON:  09/30/2020 FINDINGS: Small bilateral pleural effusions have developed, new since prior examination. Lungs are clear. No pneumothorax. Mild cardiomegaly is stable. Pulmonary vascularity is normal. No acute bone abnormality. IMPRESSION: Interval development of small bilateral pleural effusions. Stable cardiomegaly. Electronically Signed   By: Fidela Salisbury MD   On: 2021-05-23 21:25    Microbiology Recent Results (from the past 240 hour(s))  Blood culture (routine x 2)     Status: Abnormal (Preliminary result)   Collection Time: 05/23/2021  8:36 PM   Specimen: BLOOD  Result Value Ref Range Status   Specimen Description   Final    BLOOD RIGHT FOREARM Performed at [facility], [address] [place], [place] [address]    Special Requests   Final    BOTTLES DRAWN AEROBIC AND ANAEROBIC Blood Culture results may not be optimal  due to an inadequate volume of blood received in culture bottles Performed at [facility], [address] [place], [place] [address]    Culture  Setup Time   Final    GRAM NEGATIVE RODS AEROBIC BOTTLE ONLY CRITICAL VALUE NOTED.  VALUE IS CONSISTENT WITH PREVIOUSLY REPORTED AND  CALLED VALUE. Performed at [facility], [address], [place], [place] [address]    Culture MYROIDES SPECIES (A)  Final   Report Status PENDING  Incomplete  Blood culture (routine x 2)     Status: Abnormal (Preliminary result)   Collection Time: 04/23/2021  8:57 PM   Specimen: BLOOD  Result Value Ref Range Status   Specimen Description   Final    BLOOD RIGHT ANTECUBITAL Performed at [facility], [address] [place], [place] [address]    Special Requests   Final    BOTTLES DRAWN AEROBIC AND ANAEROBIC Blood Culture results may not be optimal due to an inadequate volume of blood received in culture bottles Performed at [facility], [address] [place], [place] [address]    Culture  Setup Time   Final    GRAM NEGATIVE RODS AEROBIC BOTTLE ONLY CRITICAL RESULT CALLED TO, READ BACK BY AND VERIFIED WITH: Winfield Rast PATEL @ 1427 6.10.22 NJP    Culture (A)  Final    MYROIDES SPECIES SUSCEPTIBILITIES TO FOLLOW Performed at [facility], [place] [address], [place], [place] [address]    Report Status PENDING  Incomplete  Blood Culture ID Panel (Reflexed)     Status: None   Collection Time: 05/11/2021  8:57 PM  Result Value Ref Range Status   Enterococcus faecalis NOT DETECTED NOT DETECTED Final   Enterococcus Faecium NOT DETECTED NOT DETECTED Final   Listeria monocytogenes NOT DETECTED NOT DETECTED Final   Staphylococcus species NOT DETECTED NOT DETECTED Final   Staphylococcus aureus (BCID) NOT DETECTED NOT DETECTED Final   Staphylococcus epidermidis NOT DETECTED NOT DETECTED Final   Staphylococcus lugdunensis NOT DETECTED NOT DETECTED Final   Streptococcus species NOT DETECTED NOT DETECTED Final   Streptococcus agalactiae NOT DETECTED NOT DETECTED Final   Streptococcus pneumoniae NOT DETECTED NOT DETECTED Final   Streptococcus pyogenes NOT DETECTED NOT DETECTED Final   A.calcoaceticus-baumannii NOT DETECTED NOT DETECTED Final   Bacteroides  fragilis NOT DETECTED NOT DETECTED Final   Enterobacterales NOT DETECTED NOT DETECTED Final   Enterobacter cloacae complex NOT DETECTED NOT DETECTED Final   Escherichia coli NOT DETECTED NOT DETECTED Final   Klebsiella aerogenes NOT DETECTED NOT DETECTED Final   Klebsiella oxytoca NOT DETECTED NOT DETECTED Final   Klebsiella pneumoniae NOT DETECTED NOT DETECTED Final   Proteus species NOT DETECTED NOT DETECTED Final   Salmonella species NOT DETECTED NOT DETECTED Final   Serratia marcescens NOT DETECTED NOT DETECTED Final   Haemophilus influenzae NOT DETECTED NOT DETECTED Final   Neisseria meningitidis NOT DETECTED NOT DETECTED Final   Pseudomonas aeruginosa NOT DETECTED NOT DETECTED Final   Stenotrophomonas maltophilia NOT DETECTED NOT DETECTED Final   Candida albicans NOT DETECTED NOT DETECTED Final   Candida auris NOT DETECTED NOT DETECTED Final   Candida glabrata NOT DETECTED NOT DETECTED Final   Candida krusei NOT DETECTED NOT DETECTED Final   Candida parapsilosis NOT DETECTED NOT DETECTED Final   Candida tropicalis NOT DETECTED NOT DETECTED Final   Cryptococcus neoformans/gattii NOT DETECTED NOT DETECTED Final    Comment: Performed at [facility], [address] [place], [place] [address]  Resp  Panel by RT-PCR (Flu A&B, Covid) Nasopharyngeal Swab     Status: None   Collection Time: 05/04/2021 10:00 PM   Specimen: Nasopharyngeal Swab; Nasopharyngeal(NP) swabs in vial transport medium  Result Value Ref Range Status   SARS Coronavirus 2 by RT PCR NEGATIVE NEGATIVE Final    Comment: (NOTE) SARS-CoV-2 target nucleic acids are NOT DETECTED.  The SARS-CoV-2 RNA is generally detectable in upper respiratory specimens during the acute phase of infection. The lowest concentration of SARS-CoV-2 viral copies this assay can detect is 138 copies/mL. A negative result does not preclude SARS-Cov-2 infection and should not be used as the sole basis for treatment or other patient  management decisions. A negative result may occur with  improper specimen collection/handling, submission of specimen other than nasopharyngeal swab, presence of viral mutation(s) within the areas targeted by this assay, and inadequate number of viral copies(<138 copies/mL). A negative result must be combined with clinical observations, patient history, and epidemiological information. The expected result is Negative.  Fact Sheet for Patients:  EntrepreneurPulse.com.au  Fact Sheet for Healthcare Providers:  IncredibleEmployment.be  This test is no t yet approved or cleared by the [place] FDA and  has been authorized for detection and/or diagnosis of SARS-CoV-2 by FDA under an Emergency Use Authorization (EUA). This EUA will remain  in effect (meaning this test can be used) for the duration of the COVID-19 declaration under Section 564(b)(1) of the Act, 21 U.S.C.section 360bbb-3(b)(1), unless the authorization is terminated  or revoked sooner.       Influenza A by PCR NEGATIVE NEGATIVE Final   Influenza B by PCR NEGATIVE NEGATIVE Final    Comment: (NOTE) The Xpert Xpress SARS-CoV-2/FLU/RSV plus assay is intended as an aid in the diagnosis of influenza from Nasopharyngeal swab specimens and should not be used as a sole basis for treatment. Nasal washings and aspirates are unacceptable for Xpert Xpress SARS-CoV-2/FLU/RSV testing.  Fact Sheet for Patients: EntrepreneurPulse.com.au  Fact Sheet for Healthcare Providers: IncredibleEmployment.be  This test is not yet approved or cleared by the [place] FDA and has been authorized for detection and/or diagnosis of SARS-CoV-2 by FDA under an Emergency Use Authorization (EUA). This EUA will remain in effect (meaning this test can be used) for the duration of the COVID-19 declaration under Section 564(b)(1) of the Act, 21 U.S.C. section 360bbb-3(b)(1),  unless the authorization is terminated or revoked.  Performed at [facility], [address] [place], [place] [address]   MRSA PCR Screening     Status: None   Collection Time: 04/25/21 10:19 AM   Specimen: Nasopharyngeal  Result Value Ref Range Status   MRSA by PCR NEGATIVE NEGATIVE Final    Comment:        The GeneXpert MRSA Assay (FDA approved for NASAL specimens only), is one component of a comprehensive MRSA colonization surveillance program. It is not intended to diagnose MRSA infection nor to guide or monitor treatment for MRSA infections. Performed at [facility], [address] [place], [place] [address]     Lab Basic Metabolic Panel: Recent Labs  Lab 05/03/2021 2159 04/25/21 0444  NA 131* 133*  K 3.0* 4.0  CL 104 106  CO2 13* 13*  GLUCOSE 119* 85  BUN 39* 42*  CREATININE 1.61* 1.68*  CALCIUM 8.2* 7.7*  MG  --  1.7  PHOS  --  4.1   Liver Function Tests: Recent Labs  Lab 04/16/2021 2159  AST 35  ALT 10  ALKPHOS 47  BILITOT  1.6*  PROT 5.6*  ALBUMIN 2.1*   No results for input(s): LIPASE, AMYLASE in the last 168 hours. No results for input(s): AMMONIA in the last 168 hours. CBC: Recent Labs  Lab 04/21/2021 2036 04/25/21 0444  WBC 15.2* 31.5*  HGB 11.8* 11.5*  HCT 35.5* 35.1*  MCV 107.6* 106.7*  PLT 217 255   Cardiac Enzymes: No results for input(s): CKTOTAL, CKMB, CKMBINDEX, TROPONINI in the last 168 hours. Sepsis Labs: Recent Labs  Lab 05/12/2021 2036 05/07/2021 2303 04/25/21 0007 04/25/21 0101 04/25/21 0444 04/25/21 1059 04/25/21 1356  PROCALCITON  --   --  9.60  --   --   --   --   WBC 15.2*  --   --   --  31.5*  --   --   LATICACIDVEN  --  6.0*  --  3.9*  --  >11.0* >11.0*    Procedures/Operations  04/25/2021 >> Central venous catheter triple-lumen placement   Domingo Pulse Rust-[place], AGACNP-BC Acute Care Nurse Practitioner [facility]   [phone] / [phone] Please see  Amion for pager details.     Shandell Jallow L Rust-Chester 05/18/2021, 11:26 PM

## 2021-05-16 DEATH — deceased

## 2022-04-05 IMAGING — CR DG HIP (WITH OR WITHOUT PELVIS) 2-3V*L*
3 series · 3 of 3 positions shown · non-contrast
Comparison: None.

CLINICAL DATA: Fell 2 days ago.  Left leg and hip pain.

EXAM:
DG HIP (WITH OR WITHOUT PELVIS) 2-3V LEFT

[pelvis ap]
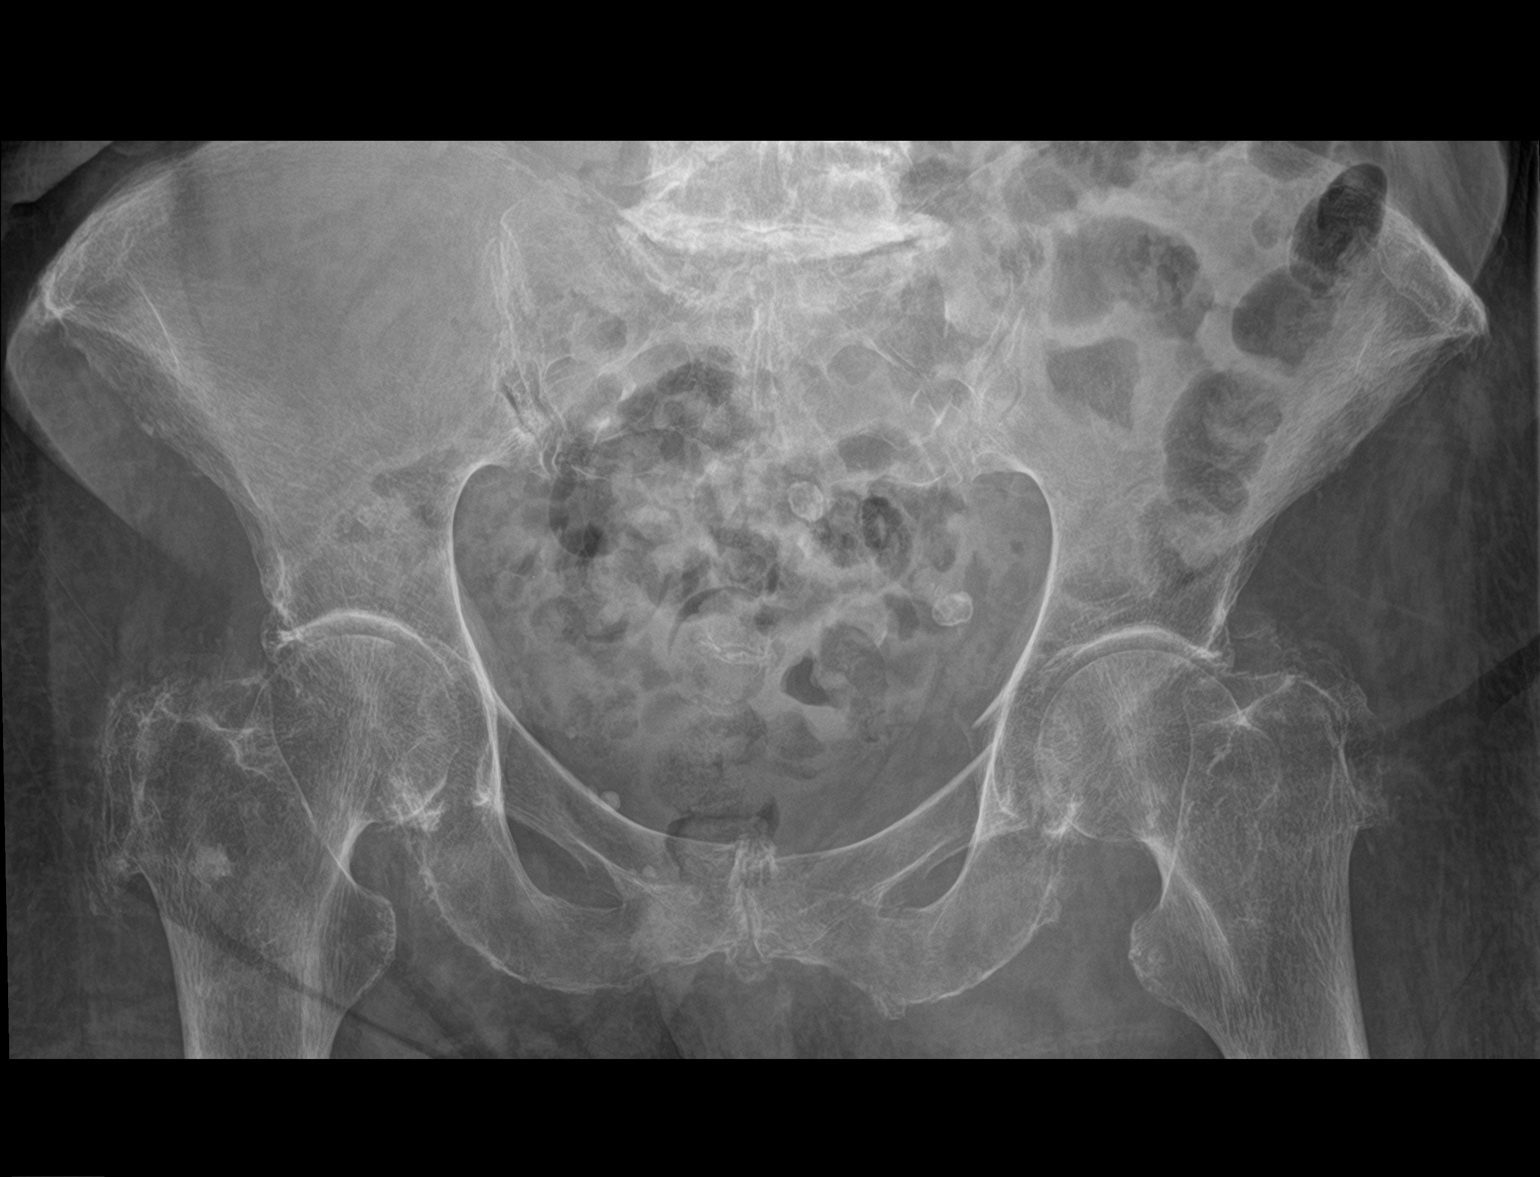

[hip ap]
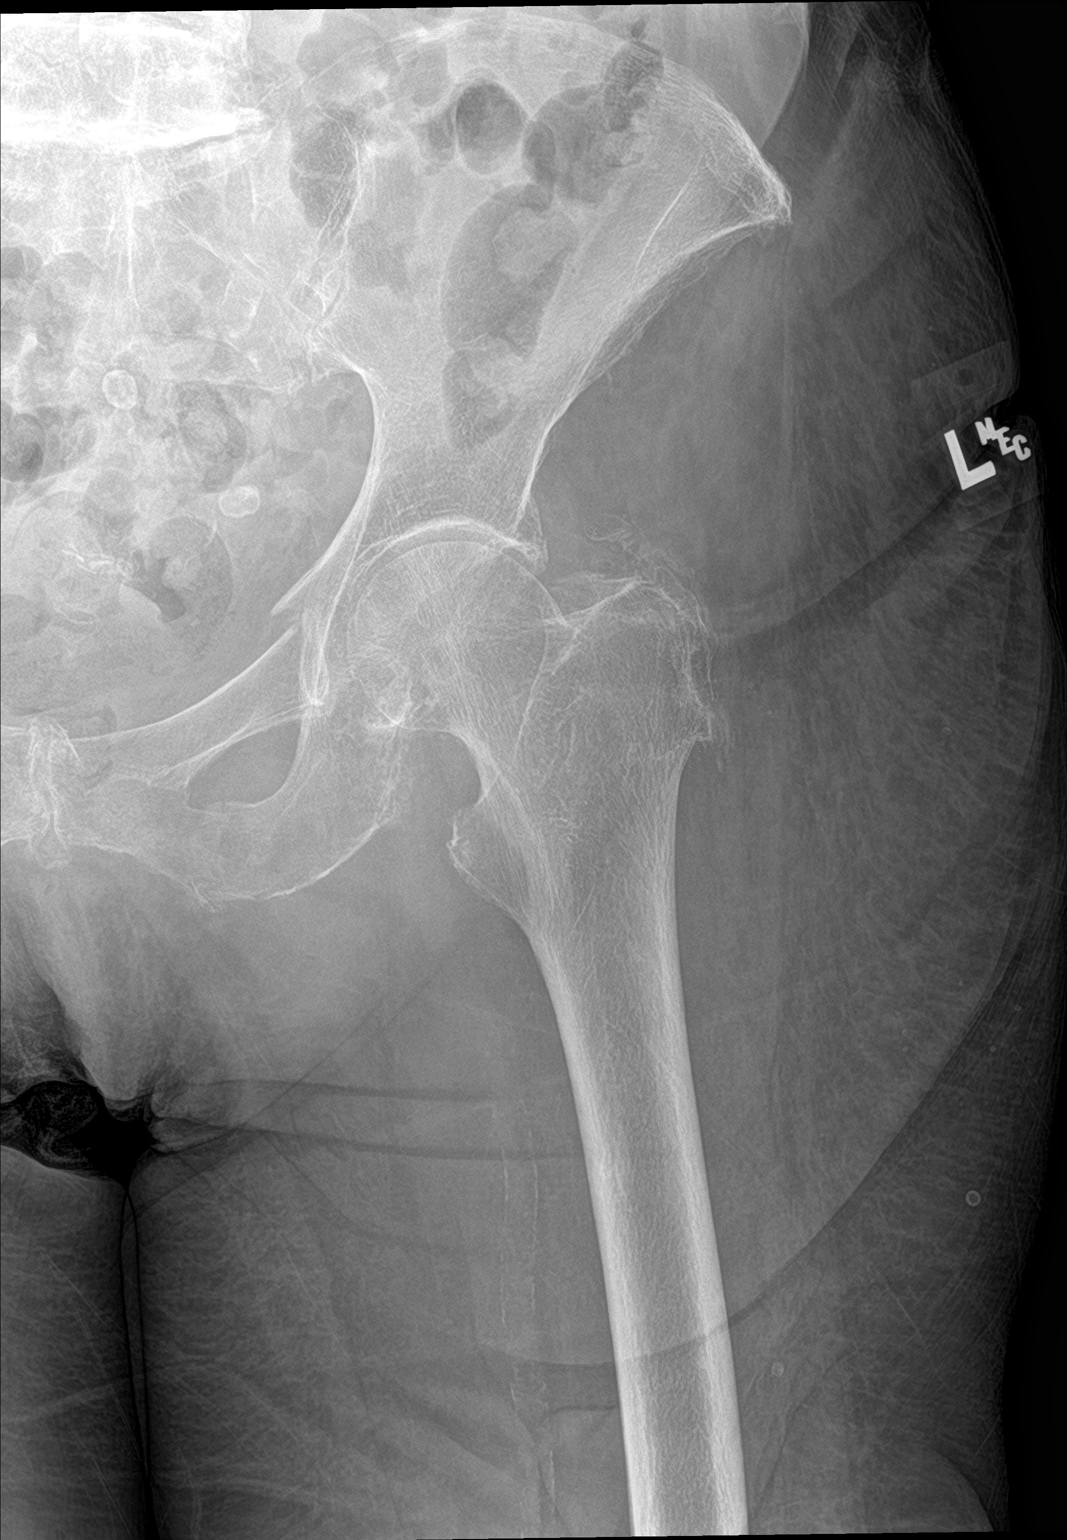

[hip lat]
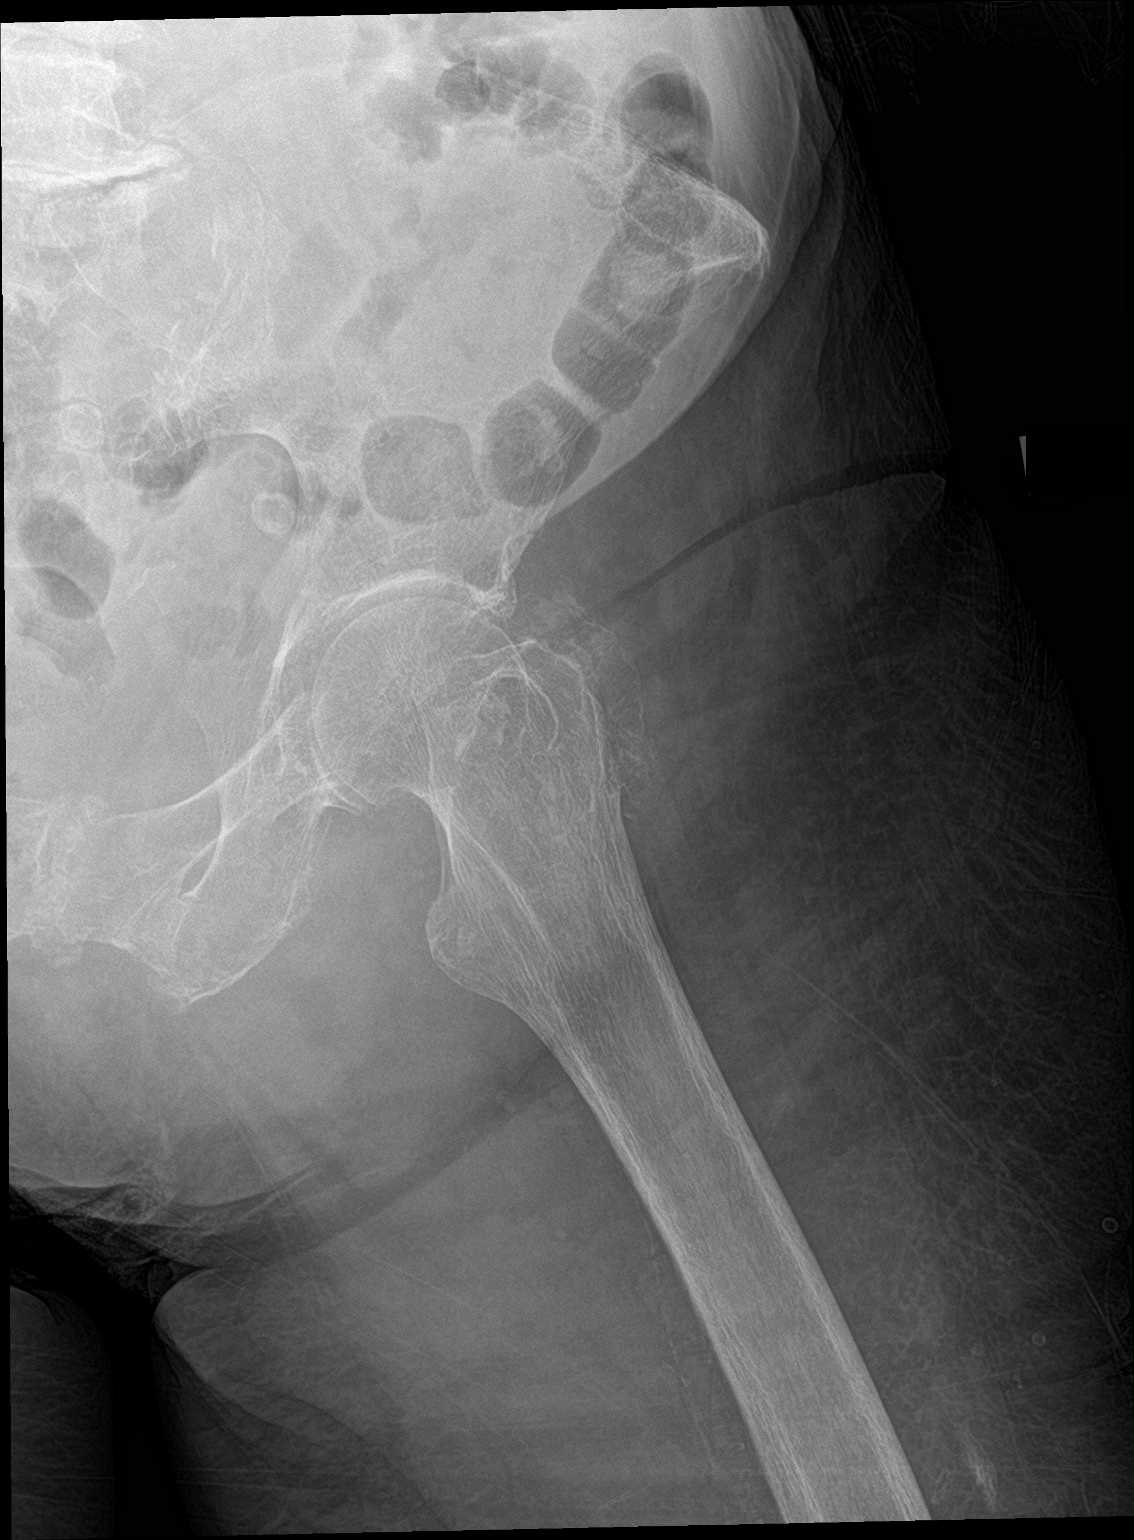

[3 of 3 positions shown; findings below may reference images not displayed]

FINDINGS: Superior and inferior rami fractures on the left. No other visible
pelvic fracture. No femur fracture.
IMPRESSION: Superior and inferior rami fractures on the left.
